# Patient Record
Sex: Female | Born: 1950 | Race: Black or African American | Hispanic: No | Marital: Married | Smoking: Never smoker
Health system: Southern US, Community
[De-identification: ages and names within clinical notes are randomized; demographics above are authoritative.]

## PROBLEM LIST (undated history)

## (undated) DIAGNOSIS — E119 Type 2 diabetes mellitus without complications: Secondary | ICD-10-CM

## (undated) DIAGNOSIS — M1990 Unspecified osteoarthritis, unspecified site: Secondary | ICD-10-CM

## (undated) DIAGNOSIS — C801 Malignant (primary) neoplasm, unspecified: Secondary | ICD-10-CM

## (undated) DIAGNOSIS — I10 Essential (primary) hypertension: Secondary | ICD-10-CM

## (undated) HISTORY — PX: OTHER SURGICAL HISTORY: SHX169

## (undated) HISTORY — PX: BREAST SURGERY: SHX581

## (undated) HISTORY — PX: TONSILLECTOMY: SUR1361

## (undated) HISTORY — PX: COLON SURGERY: SHX602

---

## 1979-05-05 HISTORY — PX: OTHER SURGICAL HISTORY: SHX169

## 1984-05-04 HISTORY — PX: ECTOPIC PREGNANCY SURGERY: SHX613

## 2004-03-06 ENCOUNTER — Other Ambulatory Visit: Admission: RE | Admit: 2004-03-06 | Discharge: 2004-03-06 | Payer: Self-pay | Admitting: Family Medicine

## 2005-07-07 ENCOUNTER — Other Ambulatory Visit: Admission: RE | Admit: 2005-07-07 | Discharge: 2005-07-07 | Payer: Self-pay | Admitting: *Deleted

## 2007-04-26 ENCOUNTER — Other Ambulatory Visit: Admission: RE | Admit: 2007-04-26 | Discharge: 2007-04-26 | Payer: Self-pay | Admitting: Family Medicine

## 2009-05-04 DIAGNOSIS — C801 Malignant (primary) neoplasm, unspecified: Secondary | ICD-10-CM

## 2009-05-04 HISTORY — DX: Malignant (primary) neoplasm, unspecified: C80.1

## 2010-04-24 ENCOUNTER — Other Ambulatory Visit
Admission: RE | Admit: 2010-04-24 | Discharge: 2010-04-24 | Payer: Self-pay | Source: Home / Self Care | Admitting: Family Medicine

## 2010-06-19 DIAGNOSIS — Z85038 Personal history of other malignant neoplasm of large intestine: Secondary | ICD-10-CM | POA: Insufficient documentation

## 2012-10-06 ENCOUNTER — Other Ambulatory Visit (HOSPITAL_COMMUNITY)
Admission: RE | Admit: 2012-10-06 | Discharge: 2012-10-06 | Disposition: A | Discharge status: Home / Self Care | Payer: BC Managed Care – PPO | Source: Ambulatory Visit | Attending: Family Medicine | Admitting: Family Medicine

## 2012-10-06 ENCOUNTER — Other Ambulatory Visit: Payer: Self-pay | Admitting: Family Medicine

## 2012-10-06 DIAGNOSIS — Z124 Encounter for screening for malignant neoplasm of cervix: Secondary | ICD-10-CM | POA: Insufficient documentation

## 2012-11-22 ENCOUNTER — Ambulatory Visit (HOSPITAL_COMMUNITY)
Admission: RE | Admit: 2012-11-22 | Discharge: 2012-11-22 | Disposition: A | Discharge status: Home / Self Care | Payer: No Typology Code available for payment source | Source: Ambulatory Visit | Attending: Orthopedic Surgery | Admitting: Orthopedic Surgery

## 2012-11-22 ENCOUNTER — Other Ambulatory Visit (HOSPITAL_COMMUNITY): Payer: Self-pay | Admitting: Orthopedic Surgery

## 2012-11-22 DIAGNOSIS — M79609 Pain in unspecified limb: Secondary | ICD-10-CM | POA: Insufficient documentation

## 2012-11-22 DIAGNOSIS — M79641 Pain in right hand: Secondary | ICD-10-CM

## 2013-05-04 HISTORY — PX: COLON SURGERY: SHX602

## 2013-07-20 ENCOUNTER — Encounter (HOSPITAL_COMMUNITY): Payer: Self-pay | Admitting: Pharmacy Technician

## 2013-07-24 ENCOUNTER — Ambulatory Visit (HOSPITAL_COMMUNITY)
Admission: RE | Admit: 2013-07-24 | Discharge: 2013-07-24 | Disposition: A | Discharge status: Home / Self Care | Payer: No Typology Code available for payment source | Source: Ambulatory Visit | Attending: Surgical | Admitting: Surgical

## 2013-07-24 ENCOUNTER — Encounter (HOSPITAL_COMMUNITY): Payer: Self-pay

## 2013-07-24 ENCOUNTER — Encounter (INDEPENDENT_AMBULATORY_CARE_PROVIDER_SITE_OTHER): Payer: Self-pay

## 2013-07-24 ENCOUNTER — Encounter (HOSPITAL_COMMUNITY)
Admission: RE | Admit: 2013-07-24 | Discharge: 2013-07-24 | Disposition: A | Discharge status: Home / Self Care | Payer: No Typology Code available for payment source | Source: Ambulatory Visit | Attending: Orthopedic Surgery | Admitting: Orthopedic Surgery

## 2013-07-24 DIAGNOSIS — I10 Essential (primary) hypertension: Secondary | ICD-10-CM | POA: Insufficient documentation

## 2013-07-24 DIAGNOSIS — R9431 Abnormal electrocardiogram [ECG] [EKG]: Secondary | ICD-10-CM | POA: Insufficient documentation

## 2013-07-24 DIAGNOSIS — Z01812 Encounter for preprocedural laboratory examination: Secondary | ICD-10-CM | POA: Insufficient documentation

## 2013-07-24 DIAGNOSIS — Z01810 Encounter for preprocedural cardiovascular examination: Secondary | ICD-10-CM | POA: Insufficient documentation

## 2013-07-24 DIAGNOSIS — Z01818 Encounter for other preprocedural examination: Secondary | ICD-10-CM | POA: Insufficient documentation

## 2013-07-24 DIAGNOSIS — E119 Type 2 diabetes mellitus without complications: Secondary | ICD-10-CM | POA: Insufficient documentation

## 2013-07-24 HISTORY — DX: Essential (primary) hypertension: I10

## 2013-07-24 HISTORY — DX: Malignant (primary) neoplasm, unspecified: C80.1

## 2013-07-24 HISTORY — DX: Type 2 diabetes mellitus without complications: E11.9

## 2013-07-24 HISTORY — DX: Unspecified osteoarthritis, unspecified site: M19.90

## 2013-07-24 LAB — URINALYSIS, ROUTINE W REFLEX MICROSCOPIC
Bilirubin Urine: NEGATIVE
Glucose, UA: NEGATIVE mg/dL
Hgb urine dipstick: NEGATIVE
Ketones, ur: NEGATIVE mg/dL
Leukocytes, UA: NEGATIVE
Nitrite: NEGATIVE
Protein, ur: NEGATIVE mg/dL
Specific Gravity, Urine: 1.024 (ref 1.005–1.030)
Urobilinogen, UA: 0.2 mg/dL (ref 0.0–1.0)
pH: 5.5 (ref 5.0–8.0)

## 2013-07-24 LAB — COMPREHENSIVE METABOLIC PANEL
ALT: 34 U/L (ref 0–35)
AST: 26 U/L (ref 0–37)
Albumin: 3.7 g/dL (ref 3.5–5.2)
Alkaline Phosphatase: 106 U/L (ref 39–117)
BUN: 20 mg/dL (ref 6–23)
CO2: 27 mEq/L (ref 19–32)
Calcium: 9.7 mg/dL (ref 8.4–10.5)
Chloride: 101 mEq/L (ref 96–112)
Creatinine, Ser: 1.03 mg/dL (ref 0.50–1.10)
GFR calc Af Amer: 66 mL/min — ABNORMAL LOW (ref >90)
GFR calc non Af Amer: 57 mL/min — ABNORMAL LOW (ref >90)
Glucose, Bld: 105 mg/dL — ABNORMAL HIGH (ref 70–99)
Potassium: 4.3 mEq/L (ref 3.7–5.3)
Sodium: 139 mEq/L (ref 137–147)
Total Bilirubin: 0.3 mg/dL (ref 0.3–1.2)
Total Protein: 7.8 g/dL (ref 6.0–8.3)

## 2013-07-24 LAB — SURGICAL PCR SCREEN
MRSA, PCR: NEGATIVE
STAPHYLOCOCCUS AUREUS: NEGATIVE

## 2013-07-24 LAB — CBC
HCT: 45.1 % (ref 36.0–46.0)
Hemoglobin: 15 g/dL (ref 12.0–15.0)
MCH: 32.2 pg (ref 26.0–34.0)
MCHC: 33.3 g/dL (ref 30.0–36.0)
MCV: 96.8 fL (ref 78.0–100.0)
PLATELETS: 237 10*3/uL (ref 150–400)
RBC: 4.66 MIL/uL (ref 3.87–5.11)
RDW: 14.3 % (ref 11.5–15.5)
WBC: 6.4 10*3/uL (ref 4.0–10.5)

## 2013-07-24 LAB — PROTIME-INR
INR: 0.9 (ref 0.00–1.49)
Prothrombin Time: 12 seconds (ref 11.6–15.2)

## 2013-07-24 LAB — APTT: aPTT: 30 seconds (ref 24–37)

## 2013-07-24 MED ORDER — CHLORHEXIDINE GLUCONATE 4 % EX LIQD: 60.0000 mL | Freq: Once | CUTANEOUS | Status: DC

## 2013-07-24 NOTE — Pre-Procedure Instructions (Signed)
EKG AND CXR WERE DONE TODAY - PREOP AT [facility]. 

## 2013-07-24 NOTE — Patient Instructions (Signed)
   YOUR SURGERY IS SCHEDULED AT [facility]  ON:  Wednesday  4/1  REPORT TO  SHORT STAY CENTER AT:  6:30 AM      PHONE # FOR SHORT STAY IS [phone]  DO NOT EAT OR DRINK ANYTHING AFTER MIDNIGHT THE NIGHT BEFORE YOUR SURGERY.  YOU MAY BRUSH YOUR TEETH, RINSE OUT YOUR MOUTH--BUT NO WATER, NO FOOD, NO CHEWING GUM, NO MINTS, NO CANDIES, NO CHEWING TOBACCO.  PLEASE TAKE THE FOLLOWING MEDICATIONS THE AM OF YOUR SURGERY WITH A FEW SIPS OF WATER:  NO MEDICATIONS TO TAKE AT HOME THE MORNING OF YOUR SURGERY.  IF YOU ARE DIABETIC:  DO NOT TAKE ANY DIABETIC MEDICATIONS THE AM OF YOUR SURGERY.    DO NOT BRING VALUABLES, MONEY, CREDIT CARDS.  DO NOT WEAR JEWELRY, MAKE-UP, NAIL POLISH AND NO METAL PINS OR CLIPS IN YOUR HAIR. CONTACT LENS, DENTURES / PARTIALS, GLASSES SHOULD NOT BE WORN TO SURGERY AND IN MOST CASES-HEARING AIDS WILL NEED TO BE REMOVED.  BRING YOUR GLASSES CASE, ANY EQUIPMENT NEEDED FOR YOUR CONTACT LENS. FOR PATIENTS ADMITTED TO THE HOSPITAL--CHECK OUT TIME THE DAY OF DISCHARGE IS 11:00 AM.  ALL INPATIENT ROOMS ARE PRIVATE - WITH BATHROOM, TELEPHONE, TELEVISION AND WIFI INTERNET.                                                    PLEASE READ OVER ANY  FACT SHEETS THAT YOU WERE GIVEN: MRSA INFORMATION, BLOOD TRANSFUSION INFORMATION, INCENTIVE SPIROMETER INFORMATION.  FAILURE TO FOLLOW THESE INSTRUCTIONS MAY RESULT IN THE CANCELLATION OF YOUR SURGERY. PLEASE BE AWARE THAT YOU MAY NEED ADDITIONAL BLOOD DRAWN DAY OF YOUR SURGERY  PATIENT SIGNATURE_________________________________

## 2013-08-01 NOTE — H&P (Signed)
TOTAL KNEE ADMISSION H&P  Patient is being admitted for right total knee arthroplasty.  Subjective:  Chief Complaint:right knee pain.  HPI: Darianna Amy, 63 y.o. female, has a history of pain and functional disability in the right knee due to arthritis and has failed non-surgical conservative treatments for greater than 12 weeks to includeNSAID's and/or analgesics, corticosteriod injections, viscosupplementation injections, use of assistive devices and activity modification.  Onset of symptoms was gradual, starting 2 years ago with gradually worsening course since that time. The patient noted no past surgery on the right knee(s).  Patient currently rates pain in the right knee(s) at 7 out of 10 with activity. Patient has night pain, worsening of pain with activity and weight bearing, pain that interferes with activities of daily living, pain with passive range of motion, crepitus and joint swelling.  Patient has evidence of periarticular osteophytes and joint space narrowing by imaging studies. There is no active infection.   Past Medical History  Diagnosis Date  . Hypertension   . Diabetes mellitus without complication   . Arthritis     OA RIGHT KNEE AND PAIN  . Cancer 2011    HX OF COLON CANCER; S/P SURGERY AND DID NOT HAVE TO HAVE CHEMO OR RADIATION    Past Surgical History  Procedure Laterality Date  . Colon surgery    . Tonsillectomy      AT AGE OF 12  . Breast surgery      RIGHT AND LEFT BREAST TUMORS REMOVED - BENIGN  . C-sections x 2    . Fatty tumor removed from right heel       Current outpatient prescriptions: amLODipine-benazepril (LOTREL) 5-20 MG per capsule, Take 1 capsule by mouth every morning., Disp: , Rfl: ;   metFORMIN (GLUMETZA) 500 MG (MOD) 24 hr tablet, Take 1,000 mg by mouth daily with breakfast., Disp: , Rfl: ;   oxyCODONE-acetaminophen (PERCOCET/ROXICET) 5-325 MG per tablet, Take 1 tablet by mouth every 4 (four) hours as needed for severe pain., Disp: , Rfl:    Allergies  Allergen Reactions  . Penicillins     Skin and nails peel     History  Substance Use Topics  . Smoking status: Never Smoker   . Smokeless tobacco: Never Used  . Alcohol Use: No    Family History Mother: MI, HTN  Review of Systems  Constitutional: Negative.   HENT: Negative.   Eyes: Negative.   Respiratory: Negative.   Cardiovascular: Negative.   Gastrointestinal: Negative.   Genitourinary: Positive for frequency. Negative for dysuria, urgency, hematuria and flank pain.  Musculoskeletal: Positive for joint pain. Negative for back pain, falls, myalgias and neck pain.       Right knee pain  Skin: Negative.   Neurological: Negative.   Endo/Heme/Allergies: Negative.   Psychiatric/Behavioral: Negative.     Objective:  Physical Exam  Constitutional: She is oriented to person, place, and time. She appears well-developed and well-nourished. No distress.  HENT:  Head: Normocephalic and atraumatic.  Left Ear: External ear normal.  Nose: Nose normal.  Mouth/Throat: Oropharynx is clear and moist.  Eyes: Conjunctivae and EOM are normal. Pupils are equal, round, and reactive to light.  Neck: Normal range of motion. Neck supple.  Cardiovascular: Normal rate, regular rhythm, normal heart sounds and intact distal pulses.   Respiratory: Effort normal and breath sounds normal. No respiratory distress. She has no wheezes.  GI: Soft. Bowel sounds are normal. She exhibits no distension. There is no tenderness.  Musculoskeletal:  Right hip: Normal.       Left hip: Normal.       Right knee: She exhibits decreased range of motion, swelling and abnormal alignment. She exhibits no effusion and no erythema. Tenderness found. Medial joint line tenderness noted. No lateral joint line tenderness noted.       Left knee: Normal.       Right lower leg: She exhibits no tenderness and no swelling.       Left lower leg: She exhibits no tenderness and no swelling.  Neurological: She  is alert and oriented to person, place, and time. She has normal strength and normal reflexes. No sensory deficit.  Skin: No rash noted. She is not diaphoretic. No erythema.  Psychiatric: She has a normal mood and affect. Her behavior is normal.   Vitals Weight: 240 lb Height: 62 in Body Surface Area: 2.18 m Body Mass Index: 43.9 kg/m Pulse: 72 (Regular) BP: 144/86 (Sitting, Left Arm, Standard)  Imaging Review Plain radiographs demonstrate severe degenerative joint disease of the right knee(s). The overall alignment issignificant varus. The bone quality appears to be good for age and reported activity level.  Assessment/Plan:  End stage arthritis, right knee   The patient history, physical examination, clinical judgment of the provider and imaging studies are consistent with end stage degenerative joint disease of the right knee(s) and total knee arthroplasty is deemed medically necessary. The treatment options including medical management, injection therapy arthroscopy and arthroplasty were discussed at length. The risks and benefits of total knee arthroplasty were presented and reviewed. The risks due to aseptic loosening, infection, stiffness, patella tracking problems, thromboembolic complications and other imponderables were discussed. The patient acknowledged the explanation, agreed to proceed with the plan and consent was signed. Patient is being admitted for inpatient treatment for surgery, pain control, PT, OT, prophylactic antibiotics, VTE prophylaxis, progressive ambulation and ADL's and discharge planning. The patient is planning to be discharged home with home health services     [place], [place]

## 2013-08-02 ENCOUNTER — Inpatient Hospital Stay (HOSPITAL_COMMUNITY)
Admission: RE | Admit: 2013-08-02 | Discharge: 2013-08-05 | DRG: 470 | Disposition: A | Discharge status: Skilled Nursing Facility | Payer: No Typology Code available for payment source | Source: Ambulatory Visit | Attending: Orthopedic Surgery | Admitting: Orthopedic Surgery

## 2013-08-02 ENCOUNTER — Inpatient Hospital Stay (HOSPITAL_COMMUNITY): Payer: No Typology Code available for payment source

## 2013-08-02 ENCOUNTER — Encounter (HOSPITAL_COMMUNITY): Payer: No Typology Code available for payment source | Admitting: Anesthesiology

## 2013-08-02 ENCOUNTER — Encounter (HOSPITAL_COMMUNITY)
Admission: RE | Disposition: A | Discharge status: Skilled Nursing Facility | Payer: Self-pay | Source: Ambulatory Visit | Attending: Orthopedic Surgery

## 2013-08-02 ENCOUNTER — Encounter (HOSPITAL_COMMUNITY): Payer: Self-pay | Admitting: *Deleted

## 2013-08-02 ENCOUNTER — Inpatient Hospital Stay (HOSPITAL_COMMUNITY): Payer: No Typology Code available for payment source | Admitting: Anesthesiology

## 2013-08-02 DIAGNOSIS — M1711 Unilateral primary osteoarthritis, right knee: Secondary | ICD-10-CM

## 2013-08-02 DIAGNOSIS — M179 Osteoarthritis of knee, unspecified: Secondary | ICD-10-CM | POA: Diagnosis present

## 2013-08-02 DIAGNOSIS — Z6841 Body Mass Index (BMI) 40.0 and over, adult: Secondary | ICD-10-CM

## 2013-08-02 DIAGNOSIS — Z8249 Family history of ischemic heart disease and other diseases of the circulatory system: Secondary | ICD-10-CM

## 2013-08-02 DIAGNOSIS — Z85038 Personal history of other malignant neoplasm of large intestine: Secondary | ICD-10-CM

## 2013-08-02 DIAGNOSIS — I10 Essential (primary) hypertension: Secondary | ICD-10-CM | POA: Diagnosis present

## 2013-08-02 DIAGNOSIS — Z96659 Presence of unspecified artificial knee joint: Secondary | ICD-10-CM

## 2013-08-02 DIAGNOSIS — M1710 Unilateral primary osteoarthritis, unspecified knee: Principal | ICD-10-CM | POA: Diagnosis present

## 2013-08-02 DIAGNOSIS — E119 Type 2 diabetes mellitus without complications: Secondary | ICD-10-CM | POA: Diagnosis present

## 2013-08-02 DIAGNOSIS — R110 Nausea: Secondary | ICD-10-CM | POA: Diagnosis not present

## 2013-08-02 DIAGNOSIS — M24569 Contracture, unspecified knee: Secondary | ICD-10-CM | POA: Diagnosis present

## 2013-08-02 HISTORY — PX: TOTAL KNEE ARTHROPLASTY: SHX125

## 2013-08-02 LAB — GLUCOSE, CAPILLARY
GLUCOSE-CAPILLARY: 123 mg/dL — AB (ref 70–99)
GLUCOSE-CAPILLARY: 121 mg/dL — AB (ref 70–99)
GLUCOSE-CAPILLARY: 133 mg/dL — AB (ref 70–99)
Glucose-Capillary: 147 mg/dL — ABNORMAL HIGH (ref 70–99)

## 2013-08-02 LAB — ABO/RH: ABO/RH(D): B NEG

## 2013-08-02 LAB — TYPE AND SCREEN
ABO/RH(D): B NEG
Antibody Screen: NEGATIVE

## 2013-08-02 SURGERY — ARTHROPLASTY, KNEE, TOTAL
Anesthesia: General | Site: Knee | Laterality: Right

## 2013-08-02 MED ORDER — SODIUM CHLORIDE 0.9 % IR SOLN
Status: DC | PRN
  Administered 2013-08-02: 1000 mL

## 2013-08-02 MED ORDER — PROMETHAZINE HCL 25 MG/ML IJ SOLN: 6.2500 mg | INTRAMUSCULAR | Status: DC | PRN

## 2013-08-02 MED ORDER — FERROUS SULFATE 325 (65 FE) MG PO TABS
325.0000 mg | ORAL_TABLET | Freq: Three times a day (TID) | ORAL | Status: DC
  Administered 2013-08-03 – 2013-08-05 (×8): 325 mg via ORAL
  Filled 2013-08-02 (×11): qty 1

## 2013-08-02 MED ORDER — BUPIVACAINE LIPOSOME 1.3 % IJ SUSP
20.0000 mL | Freq: Once | INTRAMUSCULAR | Status: AC
  Administered 2013-08-02: 20 mL
  Filled 2013-08-02: qty 20

## 2013-08-02 MED ORDER — METHOCARBAMOL 500 MG PO TABS
500.0000 mg | ORAL_TABLET | Freq: Four times a day (QID) | ORAL | Status: DC | PRN
  Administered 2013-08-03 – 2013-08-05 (×7): 500 mg via ORAL
  Filled 2013-08-02 (×7): qty 1

## 2013-08-02 MED ORDER — RIVAROXABAN 10 MG PO TABS
10.0000 mg | ORAL_TABLET | Freq: Every day | ORAL | Status: DC
  Administered 2013-08-03 – 2013-08-05 (×3): 10 mg via ORAL
  Filled 2013-08-02 (×4): qty 1

## 2013-08-02 MED ORDER — FERROUS SULFATE 325 (65 FE) MG PO TABS: 325.0000 mg | ORAL_TABLET | Freq: Three times a day (TID) | ORAL | Status: DC

## 2013-08-02 MED ORDER — SODIUM CHLORIDE 0.9 % IJ SOLN
INTRAMUSCULAR | Status: AC
  Filled 2013-08-02: qty 50

## 2013-08-02 MED ORDER — CLINDAMYCIN PHOSPHATE 900 MG/50ML IV SOLN
INTRAVENOUS | Status: AC
  Filled 2013-08-02: qty 50

## 2013-08-02 MED ORDER — METFORMIN HCL ER 500 MG PO TB24
1000.0000 mg | ORAL_TABLET | Freq: Every day | ORAL | Status: DC
  Administered 2013-08-03 – 2013-08-05 (×3): 1000 mg via ORAL
  Filled 2013-08-02 (×4): qty 2

## 2013-08-02 MED ORDER — FENTANYL CITRATE 0.05 MG/ML IJ SOLN
INTRAMUSCULAR | Status: AC
  Filled 2013-08-02: qty 5

## 2013-08-02 MED ORDER — CEFAZOLIN SODIUM-DEXTROSE 2-3 GM-% IV SOLR: 2.0000 g | INTRAVENOUS | Status: DC

## 2013-08-02 MED ORDER — NEOSTIGMINE METHYLSULFATE 1 MG/ML IJ SOLN
INTRAMUSCULAR | Status: DC | PRN
  Administered 2013-08-02: 3 mg via INTRAVENOUS

## 2013-08-02 MED ORDER — KETOROLAC TROMETHAMINE 30 MG/ML IJ SOLN: 15.0000 mg | Freq: Once | INTRAMUSCULAR | Status: DC | PRN

## 2013-08-02 MED ORDER — ALUM & MAG HYDROXIDE-SIMETH 200-200-20 MG/5ML PO SUSP: 30.0000 mL | ORAL | Status: DC | PRN

## 2013-08-02 MED ORDER — SODIUM CHLORIDE 0.9 % IJ SOLN
INTRAMUSCULAR | Status: AC
  Filled 2013-08-02: qty 10

## 2013-08-02 MED ORDER — ONDANSETRON HCL 4 MG/2ML IJ SOLN
4.0000 mg | Freq: Four times a day (QID) | INTRAMUSCULAR | Status: DC | PRN
  Administered 2013-08-02: 4 mg via INTRAVENOUS
  Filled 2013-08-02: qty 2

## 2013-08-02 MED ORDER — PROPOFOL 10 MG/ML IV BOLUS
INTRAVENOUS | Status: DC | PRN
  Administered 2013-08-02: 30 mg via INTRAVENOUS
  Administered 2013-08-02: 200 mg via INTRAVENOUS

## 2013-08-02 MED ORDER — DSS 100 MG PO CAPS: 100.0000 mg | ORAL_CAPSULE | Freq: Two times a day (BID) | ORAL | Status: DC

## 2013-08-02 MED ORDER — POLYETHYLENE GLYCOL 3350 17 G PO PACK
17.0000 g | PACK | Freq: Every day | ORAL | Status: DC | PRN
  Filled 2013-08-02: qty 1

## 2013-08-02 MED ORDER — METHOCARBAMOL 500 MG PO TABS: 500.0000 mg | ORAL_TABLET | Freq: Four times a day (QID) | ORAL | Status: DC | PRN

## 2013-08-02 MED ORDER — HYDROMORPHONE HCL PF 1 MG/ML IJ SOLN
1.0000 mg | INTRAMUSCULAR | Status: DC | PRN
  Administered 2013-08-02: 1 mg via INTRAVENOUS
  Administered 2013-08-02: 0.5 mg via INTRAVENOUS
  Filled 2013-08-02 (×2): qty 1

## 2013-08-02 MED ORDER — CLINDAMYCIN PHOSPHATE 900 MG/50ML IV SOLN
900.0000 mg | Freq: Once | INTRAVENOUS | Status: AC
  Administered 2013-08-02: 900 mg via INTRAVENOUS

## 2013-08-02 MED ORDER — ACETAMINOPHEN 10 MG/ML IV SOLN
1000.0000 mg | Freq: Once | INTRAVENOUS | Status: DC
  Filled 2013-08-02: qty 100

## 2013-08-02 MED ORDER — EPHEDRINE SULFATE 50 MG/ML IJ SOLN
INTRAMUSCULAR | Status: AC
  Filled 2013-08-02: qty 1

## 2013-08-02 MED ORDER — DOCUSATE SODIUM 100 MG PO CAPS
100.0000 mg | ORAL_CAPSULE | Freq: Two times a day (BID) | ORAL | Status: DC
  Administered 2013-08-03 – 2013-08-05 (×6): 100 mg via ORAL
  Filled 2013-08-02 (×8): qty 1

## 2013-08-02 MED ORDER — ACETAMINOPHEN 10 MG/ML IV SOLN
INTRAVENOUS | Status: DC | PRN
  Administered 2013-08-02: 1000 mg via INTRAVENOUS

## 2013-08-02 MED ORDER — FENTANYL CITRATE 0.05 MG/ML IJ SOLN
INTRAMUSCULAR | Status: AC
  Filled 2013-08-02: qty 2

## 2013-08-02 MED ORDER — ACETAMINOPHEN 325 MG PO TABS: 650.0000 mg | ORAL_TABLET | Freq: Four times a day (QID) | ORAL | Status: DC | PRN

## 2013-08-02 MED ORDER — OXYCODONE-ACETAMINOPHEN 5-325 MG PO TABS: 1.0000 | ORAL_TABLET | ORAL | Status: DC | PRN

## 2013-08-02 MED ORDER — AMLODIPINE BESY-BENAZEPRIL HCL 5-20 MG PO CAPS: 1.0000 | ORAL_CAPSULE | Freq: Every morning | ORAL | Status: DC

## 2013-08-02 MED ORDER — CISATRACURIUM BESYLATE (PF) 10 MG/5ML IV SOLN
INTRAVENOUS | Status: DC | PRN
  Administered 2013-08-02: 5 mg via INTRAVENOUS
  Administered 2013-08-02: 3 mg via INTRAVENOUS

## 2013-08-02 MED ORDER — METHOCARBAMOL 100 MG/ML IJ SOLN
500.0000 mg | Freq: Four times a day (QID) | INTRAMUSCULAR | Status: DC | PRN
  Administered 2013-08-02: 500 mg via INTRAVENOUS
  Filled 2013-08-02: qty 5

## 2013-08-02 MED ORDER — THROMBIN 5000 UNITS EX SOLR
OROMUCOSAL | Status: DC | PRN
  Administered 2013-08-02: 10:00:00 via TOPICAL

## 2013-08-02 MED ORDER — HYDROCODONE-ACETAMINOPHEN 5-325 MG PO TABS
1.0000 | ORAL_TABLET | ORAL | Status: DC | PRN
  Administered 2013-08-02 – 2013-08-03 (×2): 2 via ORAL
  Filled 2013-08-02 (×2): qty 2

## 2013-08-02 MED ORDER — BISACODYL 10 MG RE SUPP: 10.0000 mg | Freq: Every day | RECTAL | Status: DC | PRN

## 2013-08-02 MED ORDER — LACTATED RINGERS IV SOLN: INTRAVENOUS | Status: DC

## 2013-08-02 MED ORDER — HYDROMORPHONE HCL PF 1 MG/ML IJ SOLN
INTRAMUSCULAR | Status: AC
  Administered 2013-08-02: 0.5 mg via INTRAVENOUS
  Filled 2013-08-02: qty 1

## 2013-08-02 MED ORDER — ACETAMINOPHEN 650 MG RE SUPP: 650.0000 mg | Freq: Four times a day (QID) | RECTAL | Status: DC | PRN

## 2013-08-02 MED ORDER — LABETALOL HCL 5 MG/ML IV SOLN
INTRAVENOUS | Status: DC | PRN
  Administered 2013-08-02 (×2): 5 mg via INTRAVENOUS

## 2013-08-02 MED ORDER — POLYMYXIN B SULFATE 500000 UNITS IJ SOLR
INTRAMUSCULAR | Status: DC | PRN
  Administered 2013-08-02: 09:00:00

## 2013-08-02 MED ORDER — OXYCODONE-ACETAMINOPHEN 5-325 MG PO TABS
2.0000 | ORAL_TABLET | ORAL | Status: DC | PRN
  Administered 2013-08-03 – 2013-08-04 (×6): 2 via ORAL
  Filled 2013-08-02 (×7): qty 2

## 2013-08-02 MED ORDER — BENAZEPRIL HCL 20 MG PO TABS
20.0000 mg | ORAL_TABLET | Freq: Every day | ORAL | Status: DC
  Administered 2013-08-02 – 2013-08-05 (×4): 20 mg via ORAL
  Filled 2013-08-02 (×4): qty 1

## 2013-08-02 MED ORDER — NEOSTIGMINE METHYLSULFATE 1 MG/ML IJ SOLN
INTRAMUSCULAR | Status: AC
  Filled 2013-08-02: qty 10

## 2013-08-02 MED ORDER — HYDROMORPHONE HCL PF 2 MG/ML IJ SOLN
INTRAMUSCULAR | Status: AC
  Filled 2013-08-02: qty 1

## 2013-08-02 MED ORDER — KETAMINE HCL 10 MG/ML IJ SOLN
INTRAMUSCULAR | Status: DC | PRN
  Administered 2013-08-02: 30 mg via INTRAVENOUS

## 2013-08-02 MED ORDER — AMLODIPINE BESYLATE 5 MG PO TABS
5.0000 mg | ORAL_TABLET | Freq: Every day | ORAL | Status: DC
  Administered 2013-08-03 – 2013-08-05 (×3): 5 mg via ORAL
  Filled 2013-08-02 (×4): qty 1

## 2013-08-02 MED ORDER — HYDROMORPHONE HCL PF 1 MG/ML IJ SOLN
0.2500 mg | INTRAMUSCULAR | Status: DC | PRN
  Administered 2013-08-02 (×2): 0.5 mg via INTRAVENOUS

## 2013-08-02 MED ORDER — LACTATED RINGERS IV SOLN
INTRAVENOUS | Status: DC | PRN
  Administered 2013-08-02 (×2): via INTRAVENOUS

## 2013-08-02 MED ORDER — FENTANYL CITRATE 0.05 MG/ML IJ SOLN
INTRAMUSCULAR | Status: DC | PRN
  Administered 2013-08-02: 25 ug via INTRAVENOUS
  Administered 2013-08-02: 50 ug via INTRAVENOUS
  Administered 2013-08-02: 25 ug via INTRAVENOUS
  Administered 2013-08-02: 50 ug via INTRAVENOUS
  Administered 2013-08-02: 25 ug via INTRAVENOUS
  Administered 2013-08-02: 50 ug via INTRAVENOUS
  Administered 2013-08-02: 25 ug via INTRAVENOUS
  Administered 2013-08-02: 50 ug via INTRAVENOUS
  Administered 2013-08-02: 100 ug via INTRAVENOUS

## 2013-08-02 MED ORDER — INSULIN ASPART 100 UNIT/ML ~~LOC~~ SOLN
0.0000 [IU] | Freq: Three times a day (TID) | SUBCUTANEOUS | Status: DC
  Administered 2013-08-03: 2 [IU] via SUBCUTANEOUS
  Administered 2013-08-03: 3 [IU] via SUBCUTANEOUS
  Administered 2013-08-04: 2 [IU] via SUBCUTANEOUS

## 2013-08-02 MED ORDER — ONDANSETRON HCL 4 MG/2ML IJ SOLN
INTRAMUSCULAR | Status: DC | PRN
  Administered 2013-08-02 (×2): 2 mg via INTRAVENOUS

## 2013-08-02 MED ORDER — CISATRACURIUM BESYLATE 20 MG/10ML IV SOLN
INTRAVENOUS | Status: AC
  Filled 2013-08-02: qty 10

## 2013-08-02 MED ORDER — LACTATED RINGERS IV SOLN
INTRAVENOUS | Status: DC
  Administered 2013-08-03: 05:00:00 via INTRAVENOUS

## 2013-08-02 MED ORDER — ONDANSETRON HCL 4 MG PO TABS: 4.0000 mg | ORAL_TABLET | Freq: Four times a day (QID) | ORAL | Status: DC | PRN

## 2013-08-02 MED ORDER — LIDOCAINE HCL (CARDIAC) 20 MG/ML IV SOLN
INTRAVENOUS | Status: AC
  Filled 2013-08-02: qty 10

## 2013-08-02 MED ORDER — DEXAMETHASONE SODIUM PHOSPHATE 10 MG/ML IJ SOLN
INTRAMUSCULAR | Status: AC
  Filled 2013-08-02: qty 1

## 2013-08-02 MED ORDER — PROMETHAZINE HCL 25 MG/ML IJ SOLN
12.5000 mg | Freq: Four times a day (QID) | INTRAMUSCULAR | Status: DC | PRN
  Administered 2013-08-02: 12.5 mg via INTRAVENOUS
  Filled 2013-08-02: qty 1

## 2013-08-02 MED ORDER — GLYCOPYRROLATE 0.2 MG/ML IJ SOLN
INTRAMUSCULAR | Status: AC
  Filled 2013-08-02: qty 2

## 2013-08-02 MED ORDER — MENTHOL 3 MG MT LOZG
1.0000 | LOZENGE | OROMUCOSAL | Status: DC | PRN
  Filled 2013-08-02 (×3): qty 9

## 2013-08-02 MED ORDER — RIVAROXABAN 10 MG PO TABS: 10.0000 mg | ORAL_TABLET | Freq: Every day | ORAL | Status: DC

## 2013-08-02 MED ORDER — FLEET ENEMA 7-19 GM/118ML RE ENEM: 1.0000 | ENEMA | Freq: Once | RECTAL | Status: AC | PRN

## 2013-08-02 MED ORDER — PROPOFOL 10 MG/ML IV BOLUS
INTRAVENOUS | Status: AC
  Filled 2013-08-02: qty 20

## 2013-08-02 MED ORDER — HYDROMORPHONE HCL PF 1 MG/ML IJ SOLN
INTRAMUSCULAR | Status: DC | PRN
  Administered 2013-08-02 (×2): 1 mg via INTRAVENOUS

## 2013-08-02 MED ORDER — KETAMINE HCL 10 MG/ML IJ SOLN
INTRAMUSCULAR | Status: AC
  Filled 2013-08-02: qty 1

## 2013-08-02 MED ORDER — MIDAZOLAM HCL 2 MG/2ML IJ SOLN
INTRAMUSCULAR | Status: AC
  Filled 2013-08-02: qty 2

## 2013-08-02 MED ORDER — GLYCOPYRROLATE 0.2 MG/ML IJ SOLN
INTRAMUSCULAR | Status: DC | PRN
  Administered 2013-08-02: 0.4 mg via INTRAVENOUS

## 2013-08-02 MED ORDER — SODIUM CHLORIDE 0.9 % IJ SOLN
INTRAMUSCULAR | Status: DC | PRN
  Administered 2013-08-02: 20 mL

## 2013-08-02 MED ORDER — MIDAZOLAM HCL 5 MG/5ML IJ SOLN
INTRAMUSCULAR | Status: DC | PRN
  Administered 2013-08-02: 1 mg via INTRAVENOUS

## 2013-08-02 MED ORDER — CLINDAMYCIN PHOSPHATE 600 MG/50ML IV SOLN
600.0000 mg | Freq: Four times a day (QID) | INTRAVENOUS | Status: AC
  Administered 2013-08-02 (×2): 600 mg via INTRAVENOUS
  Filled 2013-08-02 (×2): qty 50

## 2013-08-02 MED ORDER — THROMBIN 5000 UNITS EX SOLR
CUTANEOUS | Status: AC
  Filled 2013-08-02: qty 5000

## 2013-08-02 MED ORDER — AMLODIPINE BESYLATE 5 MG PO TABS
5.0000 mg | ORAL_TABLET | Freq: Once | ORAL | Status: AC
  Administered 2013-08-02: 5 mg via ORAL
  Filled 2013-08-02: qty 1

## 2013-08-02 MED ORDER — EPHEDRINE SULFATE 50 MG/ML IJ SOLN
INTRAMUSCULAR | Status: DC | PRN
  Administered 2013-08-02: 10 mg via INTRAVENOUS
  Administered 2013-08-02: 5 mg via INTRAVENOUS
  Administered 2013-08-02: 10 mg via INTRAVENOUS

## 2013-08-02 MED ORDER — PHENOL 1.4 % MT LIQD
1.0000 | OROMUCOSAL | Status: DC | PRN
  Filled 2013-08-02: qty 177

## 2013-08-02 MED ORDER — ONDANSETRON HCL 4 MG/2ML IJ SOLN
INTRAMUSCULAR | Status: AC
  Filled 2013-08-02: qty 2

## 2013-08-02 SURGICAL SUPPLY — 67 items
ADH SKN CLS APL DERMABOND .7 (GAUZE/BANDAGES/DRESSINGS) ×1
BAG SPEC THK2 15X12 ZIP CLS (MISCELLANEOUS)
BAG ZIPLOCK 12X15 (MISCELLANEOUS) ×1 IMPLANT
BANDAGE ELASTIC 4 VELCRO ST LF (GAUZE/BANDAGES/DRESSINGS) ×3 IMPLANT
BANDAGE ELASTIC 6 VELCRO ST LF (GAUZE/BANDAGES/DRESSINGS) ×3 IMPLANT
BANDAGE ESMARK 6X9 LF (GAUZE/BANDAGES/DRESSINGS) ×1 IMPLANT
BLADE SAG 18X100X1.27 (BLADE) ×3 IMPLANT
BLADE SAW SGTL 11.0X1.19X90.0M (BLADE) ×3 IMPLANT
BNDG CMPR 9X6 STRL LF SNTH (GAUZE/BANDAGES/DRESSINGS) ×1
BNDG ESMARK 6X9 LF (GAUZE/BANDAGES/DRESSINGS) ×3
BONE CEMENT GENTAMICIN (Cement) ×6 IMPLANT
CAPT RP KNEE ×2 IMPLANT
CEMENT BONE GENTAMICIN 40 (Cement) ×2 IMPLANT
CUFF TOURN SGL QUICK 34 (TOURNIQUET CUFF) ×3
CUFF TRNQT CYL 34X4X40X1 (TOURNIQUET CUFF) ×1 IMPLANT
DERMABOND ADVANCED (GAUZE/BANDAGES/DRESSINGS) ×2
DERMABOND ADVANCED .7 DNX12 (GAUZE/BANDAGES/DRESSINGS) IMPLANT
DRAPE EXTREMITY T 121X128X90 (DRAPE) ×3 IMPLANT
DRAPE INCISE IOBAN 66X45 STRL (DRAPES) ×3 IMPLANT
DRAPE LG THREE QUARTER DISP (DRAPES) ×3 IMPLANT
DRAPE POUCH INSTRU U-SHP 10X18 (DRAPES) ×3 IMPLANT
DRAPE U-SHAPE 47X51 STRL (DRAPES) ×3 IMPLANT
DRSG AQUACEL AG ADV 3.5X10 (GAUZE/BANDAGES/DRESSINGS) ×2 IMPLANT
DRSG PAD ABDOMINAL 8X10 ST (GAUZE/BANDAGES/DRESSINGS) IMPLANT
DRSG TEGADERM 4X4.75 (GAUZE/BANDAGES/DRESSINGS) ×2 IMPLANT
DURAPREP 26ML APPLICATOR (WOUND CARE) ×3 IMPLANT
ELECT REM PT RETURN 9FT ADLT (ELECTROSURGICAL) ×3
ELECTRODE REM PT RTRN 9FT ADLT (ELECTROSURGICAL) ×1 IMPLANT
EVACUATOR 1/8 PVC DRAIN (DRAIN) ×3 IMPLANT
FACESHIELD WRAPAROUND (MASK) ×15 IMPLANT
FACESHIELD WRAPAROUND OR TEAM (MASK) ×5 IMPLANT
GAUZE SPONGE 2X2 8PLY STRL LF (GAUZE/BANDAGES/DRESSINGS) IMPLANT
GLOVE BIOGEL PI IND STRL 8 (GLOVE) ×1 IMPLANT
GLOVE BIOGEL PI INDICATOR 8 (GLOVE) ×2
GLOVE ECLIPSE 8.0 STRL XLNG CF (GLOVE) ×6 IMPLANT
GLOVE SURG SS PI 6.5 STRL IVOR (GLOVE) ×6 IMPLANT
GOWN BRE IMP PREV XXLGXLNG (GOWN DISPOSABLE) ×2 IMPLANT
GOWN STRL REUS W/TWL LRG LVL3 (GOWN DISPOSABLE) ×3 IMPLANT
GOWN STRL REUS W/TWL XL LVL3 (GOWN DISPOSABLE) ×5 IMPLANT
HANDPIECE INTERPULSE COAX TIP (DISPOSABLE) ×3
IMMOBILIZER KNEE 20 (SOFTGOODS) ×3 IMPLANT
KIT BASIN OR (CUSTOM PROCEDURE TRAY) ×3 IMPLANT
MANIFOLD NEPTUNE II (INSTRUMENTS) ×3 IMPLANT
NEEDLE HYPO 22GX1.5 SAFETY (NEEDLE) ×3 IMPLANT
NS IRRIG 1000ML POUR BTL (IV SOLUTION) IMPLANT
PACK TOTAL JOINT (CUSTOM PROCEDURE TRAY) ×3 IMPLANT
PADDING CAST COTTON 6X4 STRL (CAST SUPPLIES) IMPLANT
POSITIONER SURGICAL ARM (MISCELLANEOUS) ×3 IMPLANT
SET HNDPC FAN SPRY TIP SCT (DISPOSABLE) ×1 IMPLANT
SPONGE GAUZE 2X2 STER 10/PKG (GAUZE/BANDAGES/DRESSINGS) ×2
SPONGE LAP 18X18 X RAY DECT (DISPOSABLE) IMPLANT
SPONGE SURGIFOAM ABS GEL 100 (HEMOSTASIS) ×3 IMPLANT
STAPLER VISISTAT 35W (STAPLE) IMPLANT
SUCTION FRAZIER 12FR DISP (SUCTIONS) ×3 IMPLANT
SUT BONE WAX W31G (SUTURE) ×3 IMPLANT
SUT MNCRL AB 4-0 PS2 18 (SUTURE) ×3 IMPLANT
SUT VIC AB 1 CT1 27 (SUTURE) ×9
SUT VIC AB 1 CT1 27XBRD ANTBC (SUTURE) ×2 IMPLANT
SUT VIC AB 2-0 CT1 27 (SUTURE) ×6
SUT VIC AB 2-0 CT1 TAPERPNT 27 (SUTURE) ×2 IMPLANT
SUT VLOC 180 0 24IN GS25 (SUTURE) ×3 IMPLANT
SYR 20CC LL (SYRINGE) ×3 IMPLANT
TOWEL OR 17X26 10 PK STRL BLUE (TOWEL DISPOSABLE) ×4 IMPLANT
TOWER CARTRIDGE SMART MIX (DISPOSABLE) ×3 IMPLANT
TRAY FOLEY CATH 14FRSI W/METER (CATHETERS) ×3 IMPLANT
WATER STERILE IRR 1500ML POUR (IV SOLUTION) ×4 IMPLANT
WRAP KNEE MAXI GEL POST OP (GAUZE/BANDAGES/DRESSINGS) ×3 IMPLANT

## 2013-08-02 NOTE — Progress Notes (Signed)
PT Cancellation Note  Patient Details Name: Christina [name] MRN: [id number] DOB: December 12, 1950   Cancelled Treatment:        PT deferred this date 2* pt nausea.  Will follow in am.  Dempsey Ahonen 08/02/2013, 3:56 PM

## 2013-08-02 NOTE — Anesthesia Preprocedure Evaluation (Signed)
Anesthesia Evaluation  Patient identified by MRN, date of birth, ID band Patient awake    Reviewed: Allergy & Precautions, H&P , NPO status , Patient's Chart, lab work & pertinent test results  Airway Mallampati: II TM Distance: >3 FB Neck ROM: Full    Dental no notable dental hx.    Pulmonary neg pulmonary ROS,  breath sounds clear to auscultation  Pulmonary exam normal       Cardiovascular hypertension, Pt. on medications Rhythm:Regular Rate:Normal     Neuro/Psych negative neurological ROS  negative psych ROS   GI/Hepatic negative GI ROS, Neg liver ROS,   Endo/Other  diabetes, Type 2Morbid obesity  Renal/GU negative Renal ROS  negative genitourinary   Musculoskeletal negative musculoskeletal ROS (+)   Abdominal   Peds negative pediatric ROS (+)  Hematology negative hematology ROS (+)   Anesthesia Other Findings   Reproductive/Obstetrics negative OB ROS                           Anesthesia Physical Anesthesia Plan  ASA: III  Anesthesia Plan: General   Post-op Pain Management:    Induction: Intravenous  Airway Management Planned: Oral ETT  Additional Equipment:   Intra-op Plan:   Post-operative Plan: Extubation in OR  Informed Consent:   Dental advisory given  Plan Discussed with: CRNA and Surgeon  Anesthesia Plan Comments:         Anesthesia Quick Evaluation

## 2013-08-02 NOTE — Op Note (Signed)
NAMEBERNITA, BECKSTROM                ACCOUNT NO.:  [id number]  MEDICAL RECORD NO.:  [id number]  LOCATION:  78                         FACILITY:  [facility]  PHYSICIAN:  Kipp Brood. Zalaya Astarita, M.D.DATE OF BIRTH:  [birth year]  DATE OF PROCEDURE:  08/02/2013 DATE OF DISCHARGE:                              OPERATIVE REPORT   SURGEON:  Kipp Brood. Gladstone Lighter, M.D.  ASSISTANT:  Ardeen Jourdain, [place]  PREOPERATIVE DIAGNOSES: 1. Severe flexion contracture, right knee. 2. Severe genu varus, right knee. 3. Severe degenerative arthritis with bone-on-bone, right knee.  POSTOPERATIVE DIAGNOSES: 1. Severe flexion contracture, right knee. 2. Severe genu varus, right knee. 3. Severe degenerative arthritis with bone-on-bone, right knee.  OPERATION:  Right total knee arthroplasty utilizing DePuy system; all 3 components were cemented.  Gentamicin was used in the cement.  I utilized size 3 right posterior cruciate sacrificing femoral component, tibial component tray was a size 2.5.  The insert was a size 3, 15 mm thickness, and the patella was a size 35 with 3 pegs.  PROCEDURE:  Under general anesthesia, routine orthopedic prepping and draping of the right lower extremity was carried out.  The appropriate time-out was first carried out.  I also marked the appropriate right leg in the holding area.  The leg was exsanguinated with an Esmarch, tourniquet was elevated to 325 mmHg.  At this time, knee was flexed and an anterior approach to the knee was carried out.  Two flaps were created.  I then carried out a median parapatellar incision and reflected the patella laterally.  With the knee flexed, I did medial and lateral meniscectomies and excised the anterior and posterior cruciate ligaments.  I then did a synovectomy.  Then attention was directed to the femur, a drill hole was made in the intercondylar notch of the femur.  Guide rod was inserted up into the canal.  I thoroughly irrigated out the canal.  I  removed 30 mm thickness off the distal femur at this time.  At this particular point, we completed our synovectomy. I then measured the femur to be a size 3.  We cut the femur for a size 3 with anterior, posterior, and chamfering cuts in usual fashion.  Next, attention was directed to the tibial tray.  The tibial tray was a size 2.5.  We made a drill hole in the tibial plateau, inserted the guidewire, thoroughly irrigated out the canal, and removed 8 mm thickness off the affected medial side to help relieve the contracture. Note, I did do a good medial release as well.  After this, we then utilized our lamina spreaders and removed the posterior spurs.  We inserted our spacer guide and reflected a 15-mm thickness tibial component.  After that was done, we then proceeded to do our keel cut out of the proximal tibial plateau.  I then did my notch cut out of the distal femur.  Following that, we inserted our trial components, had excellent fit, and then did our resurfacing procedure on the patella for a size 35 patella.  Three drill holes were made in the patella.  We removed all trial components, water picked out the knee, cemented all 3 components simultaneously.  After the cement was hard, we removed all loose pieces of cement, irrigated the knee out, and injected half of the mixture of 20 mL of Exparel and 20 mL of saline during the procedure, and then inserted some thrombin-soaked Gelfoam in the posterior compartment.  Following this, we then inserted our trials with a 15-mm thickness insert that fit very nicely, so we then removed the trial tibial component, made sure there were no other loose pieces of cement, and then inserted our permanent 15 mm thickness insert size 3 rotating platform.  I reduced the knee and had good stability.  I then tightened up the medial corner in the usual fashion, inserted a Hemovac drain, closed the knee in layers in usual fashion.  Sterile dressings  were applied.  Note, the patient was allergic to penicillin, so we gave her Cleocin 900 mg.          ______________________________ Kipp Brood. Gladstone Lighter, M.D.     RAG/MEDQ  D:  08/02/2013  T:  08/02/2013  Job:  [id number]

## 2013-08-02 NOTE — Discharge Instructions (Addendum)
Walk with your walker. Weight bearing as tolerated [place] will follow you at home for your therapy Do not change your dressing unless there is excess drainage Shower only, no tub bath. Call if any temperatures greater than 101 or any wound complications: [phone] during the day and ask for Dr. Charlestine Night nurse, Brunilda Payor.  Information on my medicine - XARELTO (Rivaroxaban)  This medication education was reviewed with me or my healthcare representative as part of my discharge preparation.  The pharmacist that spoke with me during my hospital stay was:  Clovis Riley, [facility]  Why was Xarelto prescribed for you? Xarelto was prescribed for you to reduce the risk of blood clots forming after orthopedic surgery. The medical term for these abnormal blood clots is venous thromboembolism (VTE).  What do you need to know about xarelto ? Take your Xarelto ONCE DAILY at the same time every day. You may take it either with or without food.  If you have difficulty swallowing the tablet whole, you may crush it and mix in applesauce just prior to taking your dose.  Take Xarelto exactly as prescribed by your doctor and DO NOT stop taking Xarelto without talking to the doctor who prescribed the medication.  Stopping without other VTE prevention medication to take the place of Xarelto may increase your risk of developing a clot.  After discharge, you should have regular check-up appointments with your healthcare provider that is prescribing your Xarelto.    What do you do if you miss a dose? If you miss a dose, take it as soon as you remember on the same day then continue your regularly scheduled once daily regimen the next day. Do not take two doses of Xarelto on the same day.   Important Safety Information A possible side effect of Xarelto is bleeding. You should call your healthcare provider right away if you experience any of the following:   Bleeding from an injury or  your nose that does not stop.   Unusual colored urine (red or dark brown) or unusual colored stools (red or black).   Unusual bruising for unknown reasons.   A serious fall or if you hit your head (even if there is no bleeding).  Some medicines may interact with Xarelto and might increase your risk of bleeding while on Xarelto. To help avoid this, consult your healthcare provider or pharmacist prior to using any new prescription or non-prescription medications, including herbals, vitamins, non-steroidal anti-inflammatory drugs (NSAIDs) and supplements.  This website has more information on Xarelto: [email or web address]

## 2013-08-02 NOTE — Interval H&P Note (Signed)
History and Physical Interval Note:  08/02/2013 8:20 AM  Christina [name]  has presented today for surgery, with the diagnosis of OA OF RIGHT KNEE  The various methods of treatment have been discussed with the patient and family. After consideration of risks, benefits and other options for treatment, the patient has consented to  Procedure(s): RIGHT TOTAL KNEE ARTHROPLASTY (Right) as a surgical intervention .  The patient's history has been reviewed, patient examined, no change in status, stable for surgery.  I have reviewed the patient's chart and labs.  Questions were answered to the patient's satisfaction.     Christina [name] A

## 2013-08-02 NOTE — Anesthesia Postprocedure Evaluation (Signed)
  Anesthesia Post-op Note  Patient: Christina [name]  Procedure(s) Performed: Procedure(s) (LRB): RIGHT TOTAL KNEE ARTHROPLASTY (Right)  Patient Location: PACU  Anesthesia Type: General  Level of Consciousness: awake and alert   Airway and Oxygen Therapy: Patient Spontanous Breathing  Post-op Pain: mild  Post-op Assessment: Post-op Vital signs reviewed, Patient's Cardiovascular Status Stable, Respiratory Function Stable, Patent Airway and No signs of Nausea or vomiting  Last Vitals:  Filed Vitals:   08/02/13 1200  BP: 154/67  Pulse: 60  Temp:   Resp: 7    Post-op Vital Signs: stable   Complications: No apparent anesthesia complications

## 2013-08-02 NOTE — Brief Op Note (Signed)
08/02/2013  10:30 AM  PATIENT:  Christina [name]  63 y.o. 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  SPECIMEN:  No Specimen  DISPOSITION OF SPECIMEN:  N/A  COUNTS:  YES  TOURNIQUET:  * Missing tourniquet times found for documented tourniquets in log:  902111 *  DICTATION: .Other Dictation: Dictation Number [id number]  PLAN OF CARE: Admit to inpatient   PATIENT DISPOSITION:  Stable in OR   Delay start of Pharmacological VTE agent (>24hrs) due to surgical blood loss or risk of bleeding: yes

## 2013-08-02 NOTE — Transfer of Care (Signed)
Immediate Anesthesia Transfer of Care Note  Patient: Christina [name]  Procedure(s) Performed: Procedure(s): RIGHT TOTAL KNEE ARTHROPLASTY (Right)  Patient Location: PACU  Anesthesia Type:General  Level of Consciousness: awake, sedated and patient cooperative  Airway & Oxygen Therapy: Patient Spontanous Breathing and Patient connected to face mask oxygen  Post-op Assessment: Report given to PACU RN and Post -op Vital signs reviewed and stable  Post vital signs: stable  Complications: No apparent anesthesia complications

## 2013-08-03 LAB — BASIC METABOLIC PANEL
BUN: 20 mg/dL (ref 6–23)
CALCIUM: 8.3 mg/dL — AB (ref 8.4–10.5)
CO2: 26 meq/L (ref 19–32)
Chloride: 101 mEq/L (ref 96–112)
Creatinine, Ser: 1.3 mg/dL — ABNORMAL HIGH (ref 0.50–1.10)
GFR calc Af Amer: 50 mL/min — ABNORMAL LOW (ref >90)
GFR calc non Af Amer: 43 mL/min — ABNORMAL LOW (ref >90)
GLUCOSE: 169 mg/dL — AB (ref 70–99)
POTASSIUM: 4 meq/L (ref 3.7–5.3)
Sodium: 139 mEq/L (ref 137–147)

## 2013-08-03 LAB — GLUCOSE, CAPILLARY
GLUCOSE-CAPILLARY: 109 mg/dL — AB (ref 70–99)
GLUCOSE-CAPILLARY: 133 mg/dL — AB (ref 70–99)
Glucose-Capillary: 141 mg/dL — ABNORMAL HIGH (ref 70–99)
Glucose-Capillary: 157 mg/dL — ABNORMAL HIGH (ref 70–99)

## 2013-08-03 LAB — CBC
HEMATOCRIT: 38.6 % (ref 36.0–46.0)
HEMOGLOBIN: 12.6 g/dL (ref 12.0–15.0)
MCH: 32.3 pg (ref 26.0–34.0)
MCHC: 32.6 g/dL (ref 30.0–36.0)
MCV: 99 fL (ref 78.0–100.0)
Platelets: 187 10*3/uL (ref 150–400)
RBC: 3.9 MIL/uL (ref 3.87–5.11)
RDW: 14.8 % (ref 11.5–15.5)
WBC: 10.3 10*3/uL (ref 4.0–10.5)

## 2013-08-03 NOTE — Progress Notes (Signed)
Physical Therapy Treatment Patient Details Name: Christina [name] MRN: [id number] DOB: [birth year] Today's Date: 2013/08/16    History of Present Illness s/p TKR    PT Comments    Pt continues motivated and cooperative but fatigues easily and with increased time required for all tasks.  Follow Up Recommendations  Home health PT     Equipment Recommendations  Rolling walker with 5" wheels    Recommendations for Other Services OT consult     Precautions / Restrictions Precautions Precautions: Knee;Fall Required Braces or Orthoses: Knee Immobilizer - Right Knee Immobilizer - Right: Discontinue once straight leg raise with < 10 degree lag Restrictions Weight Bearing Restrictions: No Other Position/Activity Restrictions: WBAT    Mobility  Bed Mobility Overal bed mobility: Needs Assistance Bed Mobility: Sit to Supine       Sit to supine: Mod assist   General bed mobility comments: Increased time and cues for sequence and use of L LE to self assist  Transfers Overall transfer level: Needs assistance Equipment used: Rolling walker (2 wheeled) Transfers: Sit to/from Stand Sit to Stand: Mod assist         General transfer comment: cues for LE management and use of UEs to self assist  Ambulation/Gait Ambulation/Gait assistance: Mod assist Ambulation Distance (Feet): 23 Feet Assistive device: Rolling walker (2 wheeled) Gait Pattern/deviations: Step-to pattern;Decreased step length - right;Decreased step length - left;Shuffle;Antalgic;Trunk flexed Gait velocity: decr   General Gait Details: cues for posture, sequence, stride length and position from [employer]            Wheelchair Mobility    Modified Rankin (Stroke Patients Only)       Balance                                    Cognition Arousal/Alertness: Awake/alert Behavior During Therapy: WFL for tasks assessed/performed Overall Cognitive Status: Within Functional Limits for tasks  assessed                      Exercises      General Comments        Pertinent Vitals/Pain     Home Living                      Prior Function            PT Goals (current goals can now be found in the care plan section) Acute Rehab PT Goals Patient Stated Goal: be able to get around better PT Goal Formulation: With patient Time For Goal Achievement: 08/10/13 Potential to Achieve Goals: Good Progress towards PT goals: Progressing toward goals    Frequency  7X/week    PT Plan Current plan remains appropriate    Co-evaluation             End of Session Equipment Utilized During Treatment: Gait belt;Right knee immobilizer Activity Tolerance: Patient tolerated treatment well;Patient limited by fatigue Patient left: in bed;with call bell/phone within reach     Time: 9833-8250 PT Time Calculation (min): 29 min  Charges:  $Gait Training: 8-22 mins $Therapeutic Activity: 8-22 mins                    G Codes:      Christina [name] 08-16-13, 3:46 PM

## 2013-08-03 NOTE — Progress Notes (Signed)
Physical Therapy Treatment Patient Details Name: Christina [name] MRN: [id number] DOB: 02-04-1951 Today's Date: 2013/08/23    History of Present Illness s/p TKR    PT Comments    Pt motivated and cooperative but with increased time required all tasks - fatigues easily.    Follow Up Recommendations  Home health PT     Equipment Recommendations  Rolling walker with 5" wheels    Recommendations for Other Services OT consult     Precautions / Restrictions Precautions Precautions: Knee;Fall Required Braces or Orthoses: Knee Immobilizer - Right Knee Immobilizer - Right: Discontinue once straight leg raise with < 10 degree lag Restrictions Weight Bearing Restrictions: No Other Position/Activity Restrictions: WBAT    Mobility  Bed Mobility               General bed mobility comments: pt in chair  Transfers Overall transfer level: Needs assistance Equipment used: Rolling walker (2 wheeled) Transfers: Sit to/from Stand Sit to Stand: +2 physical assistance;Mod assist         General transfer comment: cues for LE management and use of UEs to self assist  Ambulation/Gait Ambulation/Gait assistance: +2 physical assistance;Mod assist Ambulation Distance (Feet): 20 Feet Assistive device: Rolling walker (2 wheeled) Gait Pattern/deviations: Step-to pattern;Decreased step length - right;Decreased step length - left;Shuffle;Antalgic;Trunk flexed Gait velocity: decr   General Gait Details: cues for posture, sequence, stride length and position from [employer]            Wheelchair Mobility    Modified Rankin (Stroke Patients Only)       Balance                                    Cognition Arousal/Alertness: Awake/alert Behavior During Therapy: WFL for tasks assessed/performed Overall Cognitive Status: Within Functional Limits for tasks assessed                      Exercises      General Comments        Pertinent Vitals/Pain  5/10; premed,     Home Living Family/patient expects to be discharged to:: Private residence Living Arrangements: Spouse/significant other Available Help at Discharge: Family Type of Home: House     Home Layout: Two level;1/2 bath on main level        Prior Function            PT Goals (current goals can now be found in the care plan section) Acute Rehab PT Goals Patient Stated Goal: be able to get around better PT Goal Formulation: With patient Time For Goal Achievement: 08/10/13 Potential to Achieve Goals: Good Progress towards PT goals: Progressing toward goals    Frequency  7X/week    PT Plan Current plan remains appropriate    Co-evaluation             End of Session Equipment Utilized During Treatment: Gait belt;Right knee immobilizer Activity Tolerance: Patient tolerated treatment well;Patient limited by fatigue Patient left: in chair;with call bell/phone within reach     Time: 1320-1346 PT Time Calculation (min): 26 min  Charges:  $Gait Training: 23-37 mins                    G Codes:      Christina [name] Aug 23, 2013, 2:40 PM

## 2013-08-03 NOTE — Progress Notes (Signed)
CARE MANAGEMENT NOTE 08/03/2013  Patient:  [facility]   Account Number:  [id number]  Date Initiated:  08/03/2013  Documentation initiated by:  Cheray Pardi  Subjective/Objective Assessment:   pt had tkr on 04012015/pod1,     Action/Plan:   home with hhc=pt and dme   Anticipated DC Date:  08/06/2013   Anticipated DC Plan:  [place] referral  NA      DC Planning Services  CM consult      [facility] Choice  NA   Choice offered to / List presented to:  NA   DME arranged  [place] arranged  HH-2 PT      Status of service:  In process, will continue to follow Medicare Important Message given?   (If response is "NO", the following Medicare IM given date fields will be blank) Date Medicare IM given:   Date Additional Medicare IM given:    Discharge Disposition:    Per UR Regulation:  Reviewed for med. necessity/level of care/duration of stay  If discussed at [place] of Stay Meetings, dates discussed:    Comments:  [id number]/Ravleen Ries Eldridge Dace, [place], [place] [phone] Chart Reviewed for discharge and hospital needs. Discharge needs at time of review: will need rolling wlaker and home P.T. Review of patient progress due on [id number].

## 2013-08-03 NOTE — Progress Notes (Signed)
   Subjective: 1 Day Post-Op Procedure(s) (LRB): RIGHT TOTAL KNEE ARTHROPLASTY (Right) Patient reports pain as moderate.   Patient seen in rounds without Dr. Gladstone Lighter. Patient is well, and has had no acute complaints or problems other than discomfort in her right knee. She reports that she had issues with nausea yesterday but has done well since having phenergan. No issues overnight. No SOB or chest pain.  Plan is to go Home after hospital stay.  Objective: Vital signs in last 24 hours: Temp:  [96.7 F (35.9 C)-98.7 F (37.1 C)] 98.7 F (37.1 C) (04/02 0541) Pulse Rate:  [56-80] 66 (04/02 0541) Resp:  [7-19] 16 (04/02 0541) BP: (146-184)/(61-99) 163/63 mmHg (04/02 0541) SpO2:  [95 %-100 %] 98 % (04/02 0541) Weight:  [108.863 kg (240 lb)] 108.863 kg (240 lb) (04/01 1305)  Intake/Output from previous day:  Intake/Output Summary (Last 24 hours) at 08/03/13 0724 Last data filed at 08/03/13 0600  Gross per 24 hour  Intake   3470 ml  Output   2895 ml  Net    575 ml    Intake/Output this shift:    Labs:  Recent Labs  08/03/13 0400  HGB 12.6    Recent Labs  08/03/13 0400  WBC 10.3  RBC 3.90  HCT 38.6  PLT 187    Recent Labs  08/03/13 0400  NA 139  K 4.0  CL 101  CO2 26  BUN 20  CREATININE 1.30*  GLUCOSE 169*  CALCIUM 8.3*   No results found for this basename: LABPT, INR,  in the last 72 hours  EXAM General - Patient is Alert and Oriented Extremity - Neurologically intact Neurovascular intact Dorsiflexion/Plantar flexion intact Dressing - dressing C/D/I Motor Function - intact, moving foot and toes well on exam.  Hemovac pulled without difficulty.  Past Medical History  Diagnosis Date  . Hypertension   . Diabetes mellitus without complication   . Arthritis     OA RIGHT KNEE AND PAIN  . Cancer 2011    HX OF COLON CANCER; S/P SURGERY AND DID NOT HAVE TO HAVE CHEMO OR RADIATION    Assessment/Plan: 1 Day Post-Op Procedure(s) (LRB): RIGHT TOTAL  KNEE ARTHROPLASTY (Right) Active Problems:   Osteoarthritis of right knee   Total knee replacement status  Estimated body mass index is 43.89 kg/(m^2) as calculated from the following:   Height as of this encounter: 5\' 2"  (1.575 m).   Weight as of this encounter: 108.863 kg (240 lb). Continue foley until after PT today as she is currently unstable to walk to the restroom Discharge home with home health Saturday  DVT Prophylaxis - Xarelto Weight-Bearing as tolerated    She is doing well. Nausea resolved. DC foley once patient is more stable with ambulation. Plan for DC Saturday as she will not have help at home before then. Will continue to monitor labs.   Nola Botkins LAUREN 08/03/2013, 7:24 AM

## 2013-08-03 NOTE — Evaluation (Signed)
Physical Therapy Evaluation Patient Details Name: Christina [name] MRN: [id number] DOB: June 10, 1950 Today's Date: 08/03/2013   History of Present Illness  s/p TKR  Clinical Impression  Pt s/p R TKR presents with decreased R LE strength/ROM and post op pain limiting functional mobility.  Pt should progress to d/c home with family assist and HHPT follow up    Follow Up Recommendations Home health PT    Equipment Recommendations  Rolling walker with 5" wheels    Recommendations for Other Services OT consult     Precautions / Restrictions Precautions Precautions: Knee Required Braces or Orthoses: Knee Immobilizer - Right Knee Immobilizer - Right: Discontinue once straight leg raise with < 10 degree lag Restrictions Weight Bearing Restrictions: No Other Position/Activity Restrictions: WBAT      Mobility  Bed Mobility Overal bed mobility: +2 for physical assistance             General bed mobility comments: pt in chair  Transfers Overall transfer level: Needs assistance Equipment used: Rolling walker (2 wheeled) Transfers: Sit to/from Stand Sit to Stand: Total assist         General transfer comment: unable to reach full stand  Ambulation/Gait Ambulation/Gait assistance: +2 physical assistance;Mod assist Ambulation Distance (Feet): 20 Feet Assistive device: Rolling walker (2 wheeled) Gait Pattern/deviations: Step-to pattern;Decreased step length - right;Decreased step length - left;Shuffle;Antalgic;Trunk flexed Gait velocity: decr   General Gait Details: cues for posture, sequence, stride length and position from [employer]            Wheelchair Mobility    Modified Rankin (Stroke Patients Only)       Balance                                             Pertinent Vitals/Pain 5/10 premed, ice packs provided    Home Living Family/patient expects to be discharged to:: Private residence Living Arrangements: Spouse/significant  other Available Help at Discharge: Family Type of Home: House Home Access: Level entry     Home Layout: Two level;1/2 bath on main level Home Equipment: None      Prior Function Level of Independence: Independent               Hand Dominance   Dominant Hand: Right    Extremity/Trunk Assessment   Upper Extremity Assessment: Generalized weakness           Lower Extremity Assessment: RLE deficits/detail RLE Deficits / Details: 2/5 quads with AAROM at knee -10 - 35 with muscle guarding    Cervical / Trunk Assessment: Normal  Communication   Communication: No difficulties  Cognition Arousal/Alertness: Awake/alert Behavior During Therapy: WFL for tasks assessed/performed Overall Cognitive Status: Within Functional Limits for tasks assessed                      General Comments      Exercises Total Joint Exercises Ankle Circles/Pumps: AROM;Both;15 reps;Supine Quad Sets: AROM;Both;10 reps;Supine Heel Slides: AAROM;Right;15 reps;Supine Straight Leg Raises: AAROM;Right;10 reps;Supine      Assessment/Plan    PT Assessment Patient needs continued PT services  PT Diagnosis Difficulty walking   PT Problem List Decreased strength;Decreased range of motion;Decreased activity tolerance;Decreased mobility;Decreased knowledge of use of DME;Obesity;Pain  PT Treatment Interventions DME instruction;Gait training;Stair training;Functional mobility training;Therapeutic activities;Therapeutic exercise;Patient/family education   PT Goals (Current goals can be found in  the Care Plan section) Acute Rehab PT Goals Patient Stated Goal: be able to get aound better PT Goal Formulation: With patient Time For Goal Achievement: 08/10/13 Potential to Achieve Goals: Good    Frequency 7X/week   Barriers to discharge        Co-evaluation               End of Session Equipment Utilized During Treatment: Gait belt;Right knee immobilizer Activity Tolerance: Patient  tolerated treatment well Patient left: in chair;with call bell/phone within reach Nurse Communication: Mobility status         Time: 7619-5093 PT Time Calculation (min): 43 min   Charges:   PT Evaluation $Initial PT Evaluation Tier I: 1 Procedure PT Treatments $Gait Training: 8-22 mins $Therapeutic Exercise: 8-22 mins $Therapeutic Activity: 8-22 mins   PT G Codes:          Janiel Crisostomo 08/03/2013, 12:31 PM

## 2013-08-03 NOTE — Evaluation (Signed)
Occupational Therapy Evaluation Patient Details Name: Christina [name] MRN: [id number] DOB: 1950-07-14 Today's Date: 08/03/2013    History of Present Illness s/p TKR   Clinical Impression   Pt presents to OT with decreased I with ADL activity due to problems listed below. Pt will benefit from skilled OT to increase I with     Follow Up Recommendations  Home health OT;Supervision/Assistance - 24 hour    Equipment Recommendations  3 in 1 bedside comode;Tub/shower bench       Precautions / Restrictions Precautions Precautions: Knee Restrictions Weight Bearing Restrictions: No      Mobility Bed Mobility               General bed mobility comments: pt in chair  Transfers Overall transfer level: Needs assistance Equipment used: Rolling walker (2 wheeled) Transfers: Sit to/from Stand Sit to Stand: Total assist         General transfer comment: unable to reach full stand         ADL Overall ADL's : Needs assistance/impaired Eating/Feeding: Set up;Sitting   Grooming: Set up;Sitting   Upper Body Bathing: Set up;Sitting   Lower Body Bathing: Sit to/from stand;Maximal assistance   Upper Body Dressing : Set up;Sitting   Lower Body Dressing: Sit to/from stand;Maximal assistance                 General ADL Comments: unable to reach full stand this OT session due to pain         Extremity/Trunk Assessment Upper Extremity Assessment Upper Extremity Assessment: Generalized weakness           Communication Communication Communication: No difficulties   Cognition Arousal/Alertness: Awake/alert   Overall Cognitive Status: Within Functional Limits for tasks assessed                                Home Living Family/patient expects to be discharged to:: Private residence Living Arrangements: Spouse/significant other Available Help at Discharge: Family Type of Home: [place]: Two level;1/2 bath on main level      Bathroom Shower/Tub: Teacher, early years/pre: Standard                     OT Diagnosis: Generalized weakness;Acute pain   OT Problem List: Decreased strength;Pain;Decreased activity tolerance   OT Treatment/Interventions: Self-care/ADL training;DME and/or AE instruction;Patient/family education    OT Goals(Current goals can be found in the care plan section) Acute Rehab OT Goals Patient Stated Goal: be able to get aound better OT Goal Formulation: With patient Time For Goal Achievement: 08/17/13 Potential to Achieve Goals: Good ADL Goals Pt Will Perform Grooming: with supervision;standing Pt Will Perform Lower Body Bathing: sit to/from stand;with adaptive equipment Pt Will Perform Lower Body Dressing: sit to/from stand;with adaptive equipment;with supervision Pt Will Transfer to Toilet: with supervision;regular height toilet Pt Will Perform Toileting - Clothing Manipulation and hygiene: with supervision;sit to/from stand Pt Will Perform Tub/Shower Transfer: with min guard assist;tub bench;rolling walker  OT Frequency: Min 3X/week              End of Session Nurse Communication: Mobility status  Activity Tolerance: Patient limited by pain Patient left: in chair;with call bell/phone within reach   Time: 1025-1053 OT Time Calculation (min): 28 min Charges:  OT General Charges $OT Visit: 1 Procedure OT Evaluation $Initial OT Evaluation Tier I: 1 Procedure OT  Treatments $Self Care/Home Management : 23-37 mins G-Codes:    Betsy Pries 2013-08-06, 11:41 AM

## 2013-08-04 LAB — BASIC METABOLIC PANEL
BUN: 16 mg/dL (ref 6–23)
CHLORIDE: 103 meq/L (ref 96–112)
CO2: 26 meq/L (ref 19–32)
Calcium: 8.6 mg/dL (ref 8.4–10.5)
Creatinine, Ser: 1.18 mg/dL — ABNORMAL HIGH (ref 0.50–1.10)
GFR calc Af Amer: 56 mL/min — ABNORMAL LOW (ref >90)
GFR calc non Af Amer: 48 mL/min — ABNORMAL LOW (ref >90)
Glucose, Bld: 167 mg/dL — ABNORMAL HIGH (ref 70–99)
Potassium: 3.8 mEq/L (ref 3.7–5.3)
Sodium: 139 mEq/L (ref 137–147)

## 2013-08-04 LAB — GLUCOSE, CAPILLARY
GLUCOSE-CAPILLARY: 131 mg/dL — AB (ref 70–99)
GLUCOSE-CAPILLARY: 101 mg/dL — AB (ref 70–99)
GLUCOSE-CAPILLARY: 116 mg/dL — AB (ref 70–99)
Glucose-Capillary: 128 mg/dL — ABNORMAL HIGH (ref 70–99)

## 2013-08-04 LAB — CBC
HCT: 34.1 % — ABNORMAL LOW (ref 36.0–46.0)
Hemoglobin: 11.1 g/dL — ABNORMAL LOW (ref 12.0–15.0)
MCH: 31.9 pg (ref 26.0–34.0)
MCHC: 32.6 g/dL (ref 30.0–36.0)
MCV: 98 fL (ref 78.0–100.0)
PLATELETS: 162 10*3/uL (ref 150–400)
RBC: 3.48 MIL/uL — AB (ref 3.87–5.11)
RDW: 15 % (ref 11.5–15.5)
WBC: 9.5 10*3/uL (ref 4.0–10.5)

## 2013-08-04 MED ORDER — OXYCODONE HCL 5 MG PO TABS
5.0000 mg | ORAL_TABLET | ORAL | Status: DC | PRN
  Administered 2013-08-04 (×4): 15 mg via ORAL
  Administered 2013-08-05: 10 mg via ORAL
  Administered 2013-08-05: 15 mg via ORAL
  Administered 2013-08-05: 10 mg via ORAL
  Filled 2013-08-04: qty 2
  Filled 2013-08-04: qty 3
  Filled 2013-08-04: qty 2
  Filled 2013-08-04: qty 3
  Filled 2013-08-04: qty 1
  Filled 2013-08-04 (×2): qty 3
  Filled 2013-08-04: qty 2

## 2013-08-04 MED ORDER — OXYCODONE HCL 5 MG PO TABS: 5.0000 mg | ORAL_TABLET | ORAL | Status: DC | PRN

## 2013-08-04 NOTE — Progress Notes (Signed)
Discussed d/c plan with PTA.  Agree with change in recommendation from [facility] to SNF.  Currently recommend ST-SNF upon discharge.  Carmelia Bake, PT, DPT 08/04/2013 Pager: [phone]

## 2013-08-04 NOTE — Progress Notes (Signed)
Physical Therapy Treatment Patient Details Name: Annmarie Plemmons MRN: [id number] DOB: 01-Dec-1950 Today's Date: 08/04/2013    History of Present Illness s/p TKR    PT Comments    POD # 2 am session.  Pt OOB in recliner.  Assisted pt out of recliner required + 2 assist and amb distance decreased due to increased c/o weakness/fatigue/pain.  Pt is not progressing with her mobility as well as she had hope and will need ST Rehab at SNF prior to D/C to home.  Pt agrees. Will update LPT.  Follow Up Recommendations  SNF     Equipment Recommendations       Recommendations for Other Services       Precautions / Restrictions Precautions Precautions: Knee;Fall Precaution Comments: Instructed pt on KI use for amb Required Braces or Orthoses: Knee Immobilizer - Right Knee Immobilizer - Right: Discontinue once straight leg raise with < 10 degree lag Restrictions Weight Bearing Restrictions: No Other Position/Activity Restrictions: WBAT    Mobility  Bed Mobility           Sit to supine: Mod assist   General bed mobility comments: Pt OOB in recliner  Transfers Overall transfer level: Needs assistance Equipment used: Rolling walker (2 wheeled) Transfers: Sit to/from Stand Sit to Stand: +2 physical assistance;Max assist         General transfer comment: 25% VC's on proper tech and hand placement plus increased time as pt struggled to perform due to werakness/fatigue/pain  Ambulation/Gait Ambulation/Gait assistance: Mod assist;+2 physical assistance Ambulation Distance (Feet): 7 Feet Assistive device: Rolling walker (2 wheeled) Gait Pattern/deviations: Step-to pattern;Decreased stance time - right;Trunk flexed;Wide base of support Gait velocity: decr   General Gait Details: decreased amb distance due to increased c/o weakness/fatigue/pain.  Great difficulty advancing either LE and MAX c/o UE weakness/fatigue using RW.   Stairs            Wheelchair Mobility     Modified Rankin (Stroke Patients Only)       Balance                                    Cognition   Behavior During Therapy: WFL for tasks assessed/performed Overall Cognitive Status: Within Functional Limits for tasks assessed                      Exercises   Total Knee Replacement TE's 10 reps B LE ankle pumps 10 reps towel squeezes 10 reps knee presses Followed by ICE     General Comments        Pertinent Vitals/Pain C/o 8/10 pain ICE applied Pain meds requested    Home Living                      Prior Function            PT Goals (current goals can now be found in the care plan section) Progress towards PT goals: Progressing toward goals    Frequency  7X/week    PT Plan      Co-evaluation             End of Session Equipment Utilized During Treatment: Gait belt;Right knee immobilizer Activity Tolerance: Patient limited by fatigue;Patient limited by pain Patient left: in chair;with call bell/phone within reach     Time: 0925-0948 PT Time Calculation (min): 23 min  Charges:  $Gait Training:  8-22 mins $Therapeutic Exercise: 8-22 mins                    G Codes:      Rica Koyanagi  PTA [facility]  Acute  Rehab Pager      [phone]

## 2013-08-04 NOTE — Discharge Summary (Signed)
PATIENT ID:      Christina [name]  MRN:     [id number] DOB/AGE:    [birth year] / 63 y.o.     DISCHARGE SUMMARY  ADMISSION DATE:    08/02/2013 DISCHARGE DATE:    ADMISSION DIAGNOSIS: OSTEOARTHRITIS OF RIGHT KNEE Past Medical History  Diagnosis Date  . Hypertension   . Diabetes mellitus without complication   . Arthritis     OA RIGHT KNEE AND PAIN  . Cancer 2011    HX OF COLON CANCER; S/P SURGERY AND DID NOT HAVE TO HAVE CHEMO OR RADIATION    DISCHARGE DIAGNOSIS:   Active Problems:   Osteoarthritis of right knee   Total knee replacement status   PROCEDURE: Procedure(s): RIGHT TOTAL KNEE ARTHROPLASTY on 08/02/2013  CONSULTS:   none  HISTORY:  See H&P in chart.  HOSPITAL COURSE:  Christina [name] is a 63 y.o. admitted on 08/02/2013 with a chief complaint of right knee pain and dysfunction, and found to have a diagnosis of OSTEOARTHRITIS OF RIGHT KNEE.  They were brought to the operating room on 08/02/2013 and underwent Procedure(s): RIGHT TOTAL KNEE ARTHROPLASTY.    They were given perioperative antibiotics: Anti-infectives   Start     Dose/Rate Route Frequency Ordered Stop   08/02/13 1430  clindamycin (CLEOCIN) IVPB 600 mg     600 mg 100 mL/hr over 30 Minutes Intravenous Every 6 hours 08/02/13 1326 08/02/13 2143   08/02/13 0917  polymyxin B 500,000 Units, bacitracin 50,000 Units in sodium chloride irrigation 0.9 % 500 mL irrigation  Status:  Discontinued       As needed 08/02/13 0917 08/02/13 1106   08/02/13 0845  clindamycin (CLEOCIN) IVPB 900 mg     900 mg 100 mL/hr over 30 Minutes Intravenous  Once 08/02/13 0830 08/02/13 0845   08/02/13 0638  ceFAZolin (ANCEF) IVPB 2 g/50 mL premix  Status:  Discontinued     2 g 100 mL/hr over 30 Minutes Intravenous On call to O.R. 08/02/13 5400 08/02/13 1318    .  Patient underwent the above named procedure and tolerated it well. The following day they were hemodynamically stable and pain was controlled on oral analgesics. They were neurovascularly  intact to the operative extremity.PT and OT was ordered and worked with patient per protocol. The patient after working with therapy and discussions with case management was felt to require additional help than she presently had in her home environment so a skilled rehab facility was sought. This is being done in preparation for discharge over the weekend.They were medically and orthopaedically stable on day 08/04/13 for discharge over the next 24-48 hrs     DIAGNOSTIC STUDIES:  RECENT RADIOGRAPHIC STUDIES :  Dg Chest 2 View  07/24/2013   CLINICAL DATA:  Diabetes.  Hypertension .  EXAM: CHEST  2 VIEW  COMPARISON:  None.  FINDINGS: Mediastinum and hilar structures are normal. The lungs are clear. Heart size normal. No pleural effusion or pneumothorax.  IMPRESSION: No active cardiopulmonary disease.   Electronically Signed   By: Marcello Moores  Register   On: 07/24/2013 17:08   Dg Knee 1-2 Views Right  08/02/2013   CLINICAL DATA:  Status post right knee replacement  EXAM: RIGHT KNEE - 1-2 VIEW  COMPARISON:  None.  FINDINGS: A right knee prosthesis is now seen. No loosening is noted. No acute fracture is noted. A surgical drain is seen in place.   Electronically Signed   By: Inez Catalina M.D.   On: 08/02/2013 11:31  RECENT VITAL SIGNS:  Patient Vitals for the past 24 hrs:  BP Temp Temp src Pulse Resp SpO2  08/04/13 1002 157/80 mmHg - - - - -  08/04/13 0630 185/95 mmHg 98 F (36.7 C) Oral 88 18 96 %  08/04/13 0400 - - - - 16 98 %  08/04/13 0000 - - - - 16 98 %  08/03/13 2100 131/79 mmHg 98.8 F (37.1 C) Oral 93 18 97 %  08/03/13 2000 - - - - 16 98 %  08/03/13 1600 - - - - 16 97 %  08/03/13 1350 185/80 mmHg 98.2 F (36.8 C) Oral 85 19 96 %  .  RECENT EKG RESULTS:    Orders placed during the hospital encounter of 07/24/13  . EKG 12-LEAD  . EKG 12-LEAD    DISCHARGE INSTRUCTIONS:  Discharge Orders   Future Orders Complete By Expires   Call MD / Call 911  As directed    Comments:     If you  experience chest pain or shortness of breath, CALL 911 and be transported to the hospital emergency room.  If you develope a fever above 101 F, pus (white drainage) or increased drainage or redness at the wound, or calf pain, call your surgeon's office.   Constipation Prevention  As directed    Comments:     Drink plenty of fluids.  Prune juice may be helpful.  You may use a stool softener, such as Colace (over the counter) 100 mg twice a day.  Use MiraLax (over the counter) for constipation as needed.   Diet Carb Modified  As directed    Discharge instructions  As directed    Comments:     Walk with your walker. Weight bearing as tolerated [place] will follow you at home for your therapy Do not change your dressing unless there is excess drainage Shower only, no tub bath. Call if any temperatures greater than 101 or any wound complications: [phone] during the day and ask for Dr. Charlestine Night nurse, Brunilda Payor.   Do not put a pillow under the knee. Place it under the heel.  As directed    Driving restrictions  As directed    Comments:     No driving   Increase activity slowly as tolerated  As directed       DISCHARGE MEDICATIONS:     Medication List         amLODipine-benazepril 5-20 MG per capsule  Commonly known as:  LOTREL  Take 1 capsule by mouth every morning.     DSS 100 MG Caps  Take 100 mg by mouth 2 (two) times daily.     ferrous sulfate 325 (65 FE) MG tablet  Take 1 tablet (325 mg total) by mouth 3 (three) times daily after meals.     metFORMIN 500 MG (MOD) 24 hr tablet  Commonly known as:  GLUMETZA  Take 1,000 mg by mouth daily with breakfast.     methocarbamol 500 MG tablet  Commonly known as:  ROBAXIN  Take 1 tablet (500 mg total) by mouth every 6 (six) hours as needed for muscle spasms.     oxyCODONE-acetaminophen 5-325 MG per tablet  Commonly known as:  PERCOCET/ROXICET  Take 1-2 tablets by mouth every 4 (four) hours as needed for severe pain.      rivaroxaban 10 MG Tabs tablet  Commonly known as:  XARELTO  Take 1 tablet (10 mg total) by mouth daily with breakfast.  FOLLOW UP VISIT:       Follow-up Information   Follow up with GIOFFRE,RONALD A, MD. Schedule an appointment as soon as possible for a visit in 2 weeks.   Specialty:  Orthopedic Surgery   Contact information:   [address] [place] [address] [phone]      Continue PT/OT with WBAT  Dressing change daily Can shower on day 5 from surgery  DISCHARGE TO: Skilled  DISPOSITION: Good  DISCHARGE CONDITION:  Festus Barren for Dr. Latanya Maudlin 08/04/2013, 12:53 PM

## 2013-08-04 NOTE — Progress Notes (Signed)
CSW contacted by OT to see pt for possible SNF placement. Pt has concerns regarding her progress and would like to look into ST Rehab. CSW has contacted her insurance and will begin authorization process for SNF placement.  Werner Lean LCSW [phone]

## 2013-08-04 NOTE — Progress Notes (Signed)
Occupational Therapy Treatment Patient Details Name: Christina [name] MRN: [id number] DOB: 1950-06-24 Today's Date: 08/04/2013    History of present illness s/p TKR   OT comments  Pt needed max A to get up from elevated bed this am.  She fatiqued and propped on walker when walking around bed:  Called for chair to be brought closer.  Pt states her plan had been 64 year old granddaughter with her til 2 pm daily, but she didn't know she would need physical help after surgery. She would benefit from snf.    Follow Up Recommendations  SNF (if home, needs 24/7 with adults)    Equipment Recommendations  3 in 1 bedside comode (pt prefers wide model)    Recommendations for Other Services      Precautions / Restrictions Precautions Precautions: Knee;Fall Required Braces or Orthoses: Knee Immobilizer - Right Knee Immobilizer - Right: Discontinue once straight leg raise with < 10 degree lag Restrictions Other Position/Activity Restrictions: WBAT       Mobility Bed Mobility           Sit to supine: Mod assist   General bed mobility comments: hob raised, increased time, cues and used sheet to support RLE  Transfers       Sit to Stand: Max assist;From elevated surface (from bed)         General transfer comment: cues for UE/LE placement    Balance                                   ADL Overall ADL's : Needs assistance/impaired             Lower Body Bathing: Maximal assistance;Sit to/from stand (max for sit to stand from bed;mod A bathe with reacher)           Toilet Transfer: Maximal assistance (for sit to stand from elevated bed; min A steps )           Functional mobility during ADLs:  (ambulates with min A but max for sit to stand) General ADL Comments: Pt had difficulty getting up from bed--cues and max A given for transition of sit to stand. Pt used sheet for bed mobility and able to get to eob with hob raised with mod A.  Demonstrated AE and  pt practiced with reacher and sock aid.  Educated on tub transfer bench.  pt has poor endurance and I recommend that she sponge bathes initially.  She has 12 year old granddaughter staying with her til 2 pm then husband.  May need stsnf unless she can arrange 24/7 with adults.  Pt fatiqued walking around bed to chair:  called for tech to bring it closer as she stopped and propped on walker twice.        Vision                     Perception     Praxis      Cognition   Behavior During Therapy: [facility] for tasks assessed/performed Overall Cognitive Status: Within Functional Limits for tasks assessed                       Extremity/Trunk Assessment               Exercises     Shoulder Instructions       General Comments      Pertinent  Vitals/ Pain       Pain R knee 5/10; ice applied and repositioned.  Initially lightheaded sitting eob  BP after several minutes (to get dynamap) was 138/82  Home Living                                          Prior Functioning/Environment              Frequency Min 3X/week     Progress Toward Goals  OT Goals(current goals can now be found in the care plan section)  Progress towards OT goals: Not progressing toward goals - comment (limited by fatique)     Plan   Needs to be changed   Co-evaluation                 End of Session     Activity Tolerance Patient limited by fatigue   Patient Left in chair;with call bell/phone within reach   Nurse Communication Mobility status        Time: 8185-6314 OT Time Calculation (min): 48 min  Charges: OT General Charges $OT Visit: 1 Procedure OT Treatments $Self Care/Home Management : 23-37 mins $Therapeutic Activity: 8-22 mins  Lamin Chandley 08/04/2013, 9:08 AM

## 2013-08-04 NOTE — Progress Notes (Signed)
Christina [name]  MRN: [id number] DOB/Age: [birth year] 64 y.o. Physician: Christina [name], M.D. 2 Days Post-Op Procedure(s) (LRB): RIGHT TOTAL KNEE ARTHROPLASTY (Right)  Subjective: Resting comfortably in bedside chair, good appetite, reports easy fatigability with therapy Vital Signs Temp:  [98 F (36.7 C)-98.8 F (37.1 C)] 98 F (36.7 C) (04/03 0630) Pulse Rate:  [85-93] 88 (04/03 0630) Resp:  [16-19] 18 (04/03 0630) BP: (131-185)/(79-95) 185/95 mmHg (04/03 0630) SpO2:  [96 %-98 %] 96 % (04/03 0630)  Lab Results  Recent Labs  08/03/13 0400 08/04/13 0425  WBC 10.3 9.5  HGB 12.6 11.1*  HCT 38.6 34.1*  PLT 187 162   BMET  Recent Labs  08/03/13 0400 08/04/13 0425  NA 139 139  K 4.0 3.8  CL 101 103  CO2 26 26  GLUCOSE 169* 167*  BUN 20 16  CREATININE 1.30* 1.18*  CALCIUM 8.3* 8.6   INR  Date Value Ref Range Status  07/24/2013 0.90  0.00 - 1.49 Final     Exam  Dressings dry, to be changed today by nursing, moves ankle well, poor quad tone  Plan Mobilize with PT, advance activity per TKA protocol. Home Saturday or Sunday  Christina [name] M 08/04/2013, 9:18 AM

## 2013-08-04 NOTE — Progress Notes (Signed)
Clinical Social Work Department BRIEF PSYCHOSOCIAL ASSESSMENT 08/04/2013  Patient:  Christina [name],Christina [name]     Account Number:  [id number]     Admit date:  08/02/2013  Clinical Social Worker:  HAIDINGER,JAMIE, LCSW  Date/Time:  08/04/2013 02:43 PM  Referred by:  CSW  Date Referred:  08/04/2013 Referred for  SNF Placement   Other Referral:   Interview type:  Patient Other interview type:    PSYCHOSOCIAL DATA Living Status:  HUSBAND Admitted from facility:   Level of care:   Primary support name:  Christina [name] Primary support relationship to patient:  SPOUSE Degree of support available:   supportive    CURRENT CONCERNS Current Concerns  Post-Acute Placement   Other Concerns:    SOCIAL WORK ASSESSMENT / PLAN Pt is a 62 yr old female living at home with spouse prior to hospitalization. CSW met with pt to assist with d/c planning. Pt was hoping to go home following hospital d/c but is progressing slowly with PT / OT. ST Rehab is needed . [employer] insurance has authorized SNF placement. SNF search has been initiated and pt has chosen [facility] for rehab. SNF has been contacted, and d/c summary sent. SNF is able to admit this weekend if pt is stable for d/c.   Assessment/plan status:  Psychosocial Support/Ongoing Assessment of Needs Other assessment/ plan:   Information/referral to community resources:   SNF list provided. Insurance coverage for SNF and ambulance transport reviewed.    PATIENT'S/FAMILY'S RESPONSE TO PLAN OF CARE: Pt is disappointed that she will not being going right from the hospital but feels rehab is the most appropriate d/c plan. " I had no idea I would need so much assistance following surgery. "  Pt is pleased her insurance will assist with the cost of placement. Insurance does not cover transport. Pt encouraged to work with PT and follow their recommendations regarding transport in order to ensure her safety.   Jamie Haidinger LCSW [phone]  

## 2013-08-04 NOTE — Progress Notes (Signed)
Clinical Social Work Department CLINICAL SOCIAL WORK PLACEMENT NOTE 08/04/2013  Patient:  Christina [name], Christina [name]  Account Number:  [id number] Admit date:  08/02/2013  Clinical Social Worker:  Werner Lean, LCSW  Date/time:  08/04/2013 03:05 PM  Clinical Social Work is seeking post-discharge placement for this patient at the following level of care:   SKILLED NURSING   (*CSW will update this form in Epic as items are completed)   08/04/2013  Patient/family provided with [facility] Department of Clinical Social Work's list of facilities offering this level of care within the geographic area requested by the patient (or if unable, by the patient's family).  08/04/2013  Patient/family informed of their freedom to choose among providers that offer the needed level of care, that participate in Medicare, Medicaid or managed care program needed by the patient, have an available bed and are willing to accept the patient.    Patient/family informed of [facility]' ownership interest in [facility], as well as of the fact that they are under no obligation to receive care at this facility.  PASARR submitted to EDS on 08/04/2013 PASARR number received from EDS on 08/04/2013  FL2 transmitted to all facilities in geographic area requested by pt/family on  08/04/2013 FL2 transmitted to all facilities within larger geographic area on   Patient informed that his/her managed care company has contracts with or will negotiate with  certain facilities, including the following:     Patient/family informed of bed offers received:  08/04/2013 Patient chooses bed at [facility] Physician recommends and patient chooses bed at    Patient to be transferred to [place] on   Patient to be transferred to facility by   The following physician request were entered in Epic:   Additional Comments:  Werner Lean LCSW [phone]

## 2013-08-05 LAB — CBC
HCT: 32.4 % — ABNORMAL LOW (ref 36.0–46.0)
Hemoglobin: 10.7 g/dL — ABNORMAL LOW (ref 12.0–15.0)
MCH: 32.4 pg (ref 26.0–34.0)
MCHC: 33 g/dL (ref 30.0–36.0)
MCV: 98.2 fL (ref 78.0–100.0)
PLATELETS: 150 10*3/uL (ref 150–400)
RBC: 3.3 MIL/uL — AB (ref 3.87–5.11)
RDW: 15 % (ref 11.5–15.5)
WBC: 8.4 10*3/uL (ref 4.0–10.5)

## 2013-08-05 LAB — GLUCOSE, CAPILLARY
GLUCOSE-CAPILLARY: 109 mg/dL — AB (ref 70–99)
Glucose-Capillary: 86 mg/dL (ref 70–99)

## 2013-08-05 NOTE — Progress Notes (Signed)
Physical Therapy Treatment Patient Details Name: Christina [name] MRN: [id number] DOB: May 21, 1950 Today's Date: 2013-08-19    History of Present Illness s/p TKR    PT Comments      Follow Up Recommendations  SNF     Equipment Recommendations  Rolling walker with 5" wheels    Recommendations for Other Services OT consult     Precautions / Restrictions Precautions Precautions: Knee;Fall Precaution Comments: Instructed pt on KI use for amb Required Braces or Orthoses: Knee Immobilizer - Right Knee Immobilizer - Right: Discontinue once straight leg raise with < 10 degree lag Restrictions Weight Bearing Restrictions: No Other Position/Activity Restrictions: WBAT    Mobility  Bed Mobility Overal bed mobility:  (OOB deferred until after additional pain meds)                Transfers                    Ambulation/Gait                 Stairs            Wheelchair Mobility    Modified Rankin (Stroke Patients Only)       Balance                                    Cognition Arousal/Alertness: Awake/alert Behavior During Therapy: WFL for tasks assessed/performed Overall Cognitive Status: Within Functional Limits for tasks assessed                      Exercises Total Joint Exercises Ankle Circles/Pumps: AROM;Both;15 reps;Supine Quad Sets: AROM;Both;10 reps;Supine Heel Slides: AAROM;Right;15 reps;Supine Straight Leg Raises: AAROM;Right;10 reps;Supine    General Comments        Pertinent Vitals/Pain 6/10; premed, ice packs provided    Home Living                      Prior Function            PT Goals (current goals can now be found in the care plan section) Acute Rehab PT Goals Patient Stated Goal: be able to get around better PT Goal Formulation: With patient Time For Goal Achievement: 08/10/13 Potential to Achieve Goals: Good Progress towards PT goals: Progressing toward goals     Frequency  7X/week    PT Plan Current plan remains appropriate    Co-evaluation             End of Session   Activity Tolerance: Patient tolerated treatment well;Patient limited by pain;Patient limited by fatigue Patient left: in bed;with call bell/phone within reach     Time: 0852-0910 PT Time Calculation (min): 18 min  Charges:  $Therapeutic Exercise: 8-22 mins                    G Codes:      Christina [name] 2013-08-19, 12:17 PM

## 2013-08-05 NOTE — Progress Notes (Signed)
Report given to Production assistant, radio at [facility].

## 2013-08-05 NOTE — Progress Notes (Signed)
Per MD, Pt ready for d/c.  Notified RN, Pt, family and facility.  Confirmed receipt of d/c summary, as admission arranged by Weekday CSW.  Facility ready to receive Pt.  Pt's husband to provide transportation and will be here at 1500 to pick Pt up.  Facility aware.  Bernita Raisin, [place] Work [phone]

## 2013-08-05 NOTE — Progress Notes (Signed)
Physical Therapy Treatment Patient Details Name: Christina [name] MRN: [id number] DOB: 1950/06/02 Today's Date: 08/05/2013    History of Present Illness s/p TKR    PT Comments    Marked improvement in activity tolerance from last day.    Follow Up Recommendations  SNF     Equipment Recommendations  Rolling walker with 5" wheels    Recommendations for Other Services OT consult     Precautions / Restrictions Precautions Precautions: Knee;Fall Precaution Comments: Instructed pt on KI use for amb Required Braces or Orthoses: Knee Immobilizer - Right Knee Immobilizer - Right: Discontinue once straight leg raise with < 10 degree lag Restrictions Weight Bearing Restrictions: No Other Position/Activity Restrictions: WBAT    Mobility  Bed Mobility Overal bed mobility: Needs Assistance;+2 for physical assistance Bed Mobility: Sit to Supine     Supine to sit: Mod assist;+2 for physical assistance     General bed mobility comments: Cues for sequence and use of L LE to self assist.  Physical assist for R LE management and to bring trunk to upright  Transfers Overall transfer level: Needs assistance Equipment used: Rolling walker (2 wheeled) Transfers: Sit to/from Stand Sit to Stand: +2 physical assistance;Min assist;Mod assist         General transfer comment: cues for LE management and use of UEs to self assist  Ambulation/Gait Ambulation/Gait assistance: +2 safety/equipment;Min assist;Mod assist Ambulation Distance (Feet): 39 Feet Assistive device: Rolling walker (2 wheeled) Gait Pattern/deviations: Step-to pattern;Decreased step length - right;Decreased step length - left;Shuffle;Trunk flexed Gait velocity: decr   General Gait Details: cues for sequence, posture, position from RW.  Increased time with multiple short rests required 2* fatigue   Stairs            Wheelchair Mobility    Modified Rankin (Stroke Patients Only)       Balance                                     Cognition Arousal/Alertness: Awake/alert Behavior During Therapy: WFL for tasks assessed/performed Overall Cognitive Status: Within Functional Limits for tasks assessed                      Exercises Total Joint Exercises Ankle Circles/Pumps: AROM;Both;15 reps;Supine Quad Sets: AROM;Both;10 reps;Supine Heel Slides: AAROM;Right;15 reps;Supine Straight Leg Raises: AAROM;Right;10 reps;Supine    General Comments        Pertinent Vitals/Pain 4/10; premed, cold packs provided    Home Living                      Prior Function            PT Goals (current goals can now be found in the care plan section) Acute Rehab PT Goals Patient Stated Goal: be able to get around better PT Goal Formulation: With patient Time For Goal Achievement: 08/10/13 Potential to Achieve Goals: Good Progress towards PT goals: Progressing toward goals    Frequency  7X/week    PT Plan Current plan remains appropriate    Co-evaluation             End of Session Equipment Utilized During Treatment: Gait belt;Right knee immobilizer Activity Tolerance: Patient tolerated treatment well;Patient limited by fatigue Patient left: in chair;with call bell/phone within reach     Time: 1001-1027 PT Time Calculation (min): 26 min  Charges:  $Gait Training: 23-37 mins $Therapeutic  Exercise: 8-22 mins                    G Codes:      Christina [name] 08/11/13, 12:22 PM

## 2013-08-05 NOTE — Progress Notes (Signed)
Received order to discharge pt to [facility] today via phone from Benjiman Core, PA-C.

## 2013-08-05 NOTE — Progress Notes (Signed)
Assessment unchanged. Pt discharged via wc to front entrance to meet awaiting vehicle to carry pt to [facility] as ordered and planned. Accompanied by NT and husband. Packet of copied information for facility as prepared by Education officer, museum given to husband to present upon arrival to [employer].

## 2013-08-05 NOTE — Progress Notes (Signed)
Subjective: Doing well.  Pain controlled.  Awaiting transfer to [facility].     Objective: Vital signs in last 24 hours: Temp:  [98.1 F (36.7 C)-99.3 F (37.4 C)] 98.8 F (37.1 C) (04/04 0626) Pulse Rate:  [85-87] 85 (04/04 0626) Resp:  [16-18] 18 (04/04 0800) BP: (132-173)/(72-81) 145/81 mmHg (04/04 0626) SpO2:  [94 %-99 %] 94 % (04/04 0800)  Intake/Output from previous day: 04/03 0701 - 04/04 0700 In: 720 [P.O.:720] Out: 1250 [Urine:1250] Intake/Output this shift:     Recent Labs  08/03/13 0400 08/04/13 0425 08/05/13 0534  HGB 12.6 11.1* 10.7*    Recent Labs  08/04/13 0425 08/05/13 0534  WBC 9.5 8.4  RBC 3.48* 3.30*  HCT 34.1* 32.4*  PLT 162 150    Recent Labs  08/03/13 0400 08/04/13 0425  NA 139 139  K 4.0 3.8  CL 101 103  CO2 26 26  BUN 20 16  CREATININE 1.30* 1.18*  GLUCOSE 169* 167*  CALCIUM 8.3* 8.6   No results found for this basename: LABPT, INR,  in the last 72 hours  Exam:  Aquacel dressing intact.  Calf nt, nvi.    Assessment/Plan: Transfer to [facility] when bed available.  F/u in office as scheduled.     Rosetta Rupnow M 08/05/2013, 10:10 AM

## 2013-08-07 ENCOUNTER — Encounter: Payer: Self-pay | Admitting: Internal Medicine

## 2013-08-07 ENCOUNTER — Non-Acute Institutional Stay (SKILLED_NURSING_FACILITY): Payer: No Typology Code available for payment source | Admitting: Internal Medicine

## 2013-08-07 DIAGNOSIS — I10 Essential (primary) hypertension: Secondary | ICD-10-CM | POA: Insufficient documentation

## 2013-08-07 DIAGNOSIS — Z96659 Presence of unspecified artificial knee joint: Secondary | ICD-10-CM

## 2013-08-07 DIAGNOSIS — E119 Type 2 diabetes mellitus without complications: Secondary | ICD-10-CM

## 2013-08-07 NOTE — Assessment & Plan Note (Signed)
Continue metformin home dose

## 2013-08-07 NOTE — Progress Notes (Signed)
MRN: [id number] Name: Christina [name]  Sex: female Age: 63 y.o. DOB: 1950/05/13  [facility] Facility/Room: 1206 Level Of Care: SNF Provider: Inocencio Homes D Emergency Contacts: Extended Emergency Contact Information Primary Emergency Contact: Ladouceur,Curtis Address: [address] [place], [place] [address] [place] Phone: [phone] Mobile Phone: [phone] Relation: Spouse Secondary Emergency Contact: Hancock,Teleasa Address: [address] [place], [place] [address] [place] Phone: [phone] Mobile Phone: [phone] Relation: Daughter  Code Status: FULL  Allergies: Penicillins  Chief Complaint  Patient presents with  . SNF admission    HPI: Patient is 63 y.o. female who is s/p R knee arthroplasty, admitted for OT/PT.  Past Medical History  Diagnosis Date  . Arthritis     OA RIGHT KNEE AND PAIN  . Cancer 2011    HX OF COLON CANCER; S/P SURGERY AND DID NOT HAVE TO HAVE CHEMO OR RADIATION  . Diabetes mellitus without complication   . Hypertension     Past Surgical History  Procedure Laterality Date  . Colon surgery    . Tonsillectomy      AT AGE OF 12  . Breast surgery      RIGHT AND LEFT BREAST TUMORS REMOVED - BENIGN  . C-sections x 2    . Fatty tumor removed from right heel    . Total knee arthroplasty Right 08/02/2013    Procedure: RIGHT TOTAL KNEE ARTHROPLASTY;  Surgeon: Tobi Bastos, MD;  Location: [facility];  Service: Orthopedics;  Laterality: Right;      Medication List       This list is accurate as of: 08/07/13 10:37 PM.  Always use your most recent med list.               amLODipine-benazepril 5-20 MG per capsule  Commonly known as:  LOTREL  Take 1 capsule by mouth every morning.     DSS 100 MG Caps  Take 100 mg by mouth 2 (two) times daily.     ferrous sulfate 325 (65 FE) MG tablet  Take 1 tablet (325 mg total) by mouth 3 (three) times daily after meals.     metFORMIN  500 MG (MOD) 24 hr tablet  Commonly known as:  GLUMETZA  Take 1,000 mg by mouth daily with breakfast.     methocarbamol 500 MG tablet  Commonly known as:  ROBAXIN  Take 1 tablet (500 mg total) by mouth every 6 (six) hours as needed for muscle spasms.     oxyCODONE-acetaminophen 5-325 MG per tablet  Commonly known as:  PERCOCET/ROXICET  Take 1-2 tablets by mouth every 4 (four) hours as needed for severe pain.     rivaroxaban 10 MG Tabs tablet  Commonly known as:  XARELTO  Take 1 tablet (10 mg total) by mouth daily with breakfast.        No orders of the defined types were placed in this encounter.     There is no immunization history on file for this patient.  History  Substance Use Topics  . Smoking status: Never Smoker   . Smokeless tobacco: Never Used  . Alcohol Use: No    Family history is noncontributory    Review of Systems  DATA OBTAINED: from patient, nurse; request pain med be scheduled GENERAL: Feels well no fevers, fatigue, appetite changes SKIN: No itching, rash or wounds EYES: No eye  pain, redness, discharge EARS: No earache, tinnitus, change in hearing NOSE: No congestion, drainage or bleeding  MOUTH/THROAT: No mouth or tooth pain, No sore throat, No difficulty chewing or swallowing  RESPIRATORY: No cough, wheezing, SOB CARDIAC: No chest pain, palpitations, + R lower extremity edema GI: No abdominal pain, No N/V/D or constipation, No heartburn or reflux  GU: No dysuria, frequency or urgency, or incontinence  MUSCULOSKELETAL: No unrelieved bone/joint pain NEUROLOGIC: No headache, dizziness or focal weakness PSYCHIATRIC: No overt anxiety or sadness. Sleeps well. No behavior issue.   Filed Vitals:   08/07/13 2233  BP: 129/64  Pulse: 87  Temp: 99 F (37.2 C)  Resp: 22    Physical Exam  GENERAL APPEARANCE: Alert, conversant. Appropriately groomed. No acute distress.  SKIN: No diaphoresis rash HEAD: Normocephalic, atraumatic  EYES:  Conjunctiva/lids clear. Pupils round, reactive. EOMs intact.  EARS: External exam WNL, canals clear. Hearing grossly normal.  NOSE: No deformity or discharge.  MOUTH/THROAT: Lips w/o lesions  RESPIRATORY: Breathing is even, unlabored. Lung sounds are clear   CARDIOVASCULAR: Heart RRR no murmurs, rubs or gallops. 1+ R  peripheral edema.  GASTROINTESTINAL: Abdomen is soft, non-tender, not distended w/ normal bowel sounds GENITOURINARY: Bladder non tender, not distended  MUSCULOSKELETAL: No abnormal joints or musculature NEUROLOGIC: Oriented X3. Cranial nerves 2-12 grossly intact. Moves all extremities no tremor. PSYCHIATRIC: Mood and affect appropriate to situation, no behavioral issues  Patient Active Problem List   Diagnosis Date Noted  . Diabetes mellitus without complication   . Hypertension   . Osteoarthritis of right knee 08/02/2013  . Total knee replacement status 08/02/2013    CBC    Component Value Date/Time   WBC 8.4 08/05/2013 0534   RBC 3.30* 08/05/2013 0534   HGB 10.7* 08/05/2013 0534   HCT 32.4* 08/05/2013 0534   PLT 150 08/05/2013 0534   MCV 98.2 08/05/2013 0534    CMP     Component Value Date/Time   NA 139 08/04/2013 0425   K 3.8 08/04/2013 0425   CL 103 08/04/2013 0425   CO2 26 08/04/2013 0425   GLUCOSE 167* 08/04/2013 0425   BUN 16 08/04/2013 0425   CREATININE 1.18* 08/04/2013 0425   CALCIUM 8.6 08/04/2013 0425   PROT 7.8 07/24/2013 1520   ALBUMIN 3.7 07/24/2013 1520   AST 26 07/24/2013 1520   ALT 34 07/24/2013 1520   ALKPHOS 106 07/24/2013 1520   BILITOT 0.3 07/24/2013 1520   GFRNONAA 48* 08/04/2013 0425   GFRAA 56* 08/04/2013 0425    Assessment and Plan  Total knee replacement status Pain meds-will schedule at pt request, robaxin and xarelto as prophylaxis;admitted OT/PT  Diabetes mellitus without complication Continue metformin home dose  Hypertension Continue Lotrel    Hennie Duos, MD

## 2013-08-07 NOTE — Assessment & Plan Note (Signed)
Pain meds-will schedule at pt request, robaxin and xarelto as prophylaxis;admitted OT/PT

## 2013-08-07 NOTE — Assessment & Plan Note (Signed)
Continue Lotrel. 

## 2013-08-07 NOTE — Progress Notes (Signed)
Clinical Social Work Department CLINICAL SOCIAL WORK PLACEMENT NOTE 08/07/2013  Patient:  Christina [name], Christina [name]  Account Number:  [id number] Admit date:  08/02/2013  Clinical Social Worker:  Werner Lean, LCSW  Date/time:  08/04/2013 03:05 PM  Clinical Social Work is seeking post-discharge placement for this patient at the following level of care:   SKILLED NURSING   (*CSW will update this form in Epic as items are completed)   08/04/2013  Patient/family provided with [facility] Department of Clinical Social Work's list of facilities offering this level of care within the geographic area requested by the patient (or if unable, by the patient's family).  08/04/2013  Patient/family informed of their freedom to choose among providers that offer the needed level of care, that participate in Medicare, Medicaid or managed care program needed by the patient, have an available bed and are willing to accept the patient.    Patient/family informed of [facility]' ownership interest in [facility], as well as of the fact that they are under no obligation to receive care at this facility.  PASARR submitted to EDS on 08/04/2013 PASARR number received from EDS on 08/04/2013  FL2 transmitted to all facilities in geographic area requested by pt/family on  08/04/2013 FL2 transmitted to all facilities within larger geographic area on   Patient informed that his/her managed care company has contracts with or will negotiate with  certain facilities, including the following:     Patient/family informed of bed offers received:  08/04/2013 Patient chooses bed at [facility] Physician recommends and patient chooses bed at    Patient to be transferred to [facility] on  08/05/2013 Patient to be transferred to facility by FAMILY  The following physician request were entered in Epic:   Additional Comments:  Werner Lean LCSW [phone]

## 2013-08-08 ENCOUNTER — Encounter: Payer: Self-pay | Admitting: *Deleted

## 2013-08-08 ENCOUNTER — Encounter: Payer: Self-pay | Admitting: Adult Health

## 2013-08-08 ENCOUNTER — Non-Acute Institutional Stay (SKILLED_NURSING_FACILITY): Payer: No Typology Code available for payment source | Admitting: Adult Health

## 2013-08-08 DIAGNOSIS — T8140XA Infection following a procedure, unspecified, initial encounter: Secondary | ICD-10-CM

## 2013-08-08 DIAGNOSIS — T8149XA Infection following a procedure, other surgical site, initial encounter: Secondary | ICD-10-CM

## 2013-08-08 NOTE — Progress Notes (Signed)
Patient ID: Christina [name], female   DOB: 06-30-1950, 64 y.o.   MRN: [id number]         PROGRESS NOTE  DATE: 08/08/2013  FACILITY:  [facility]  LEVEL OF CARE: SNF (31)  Acute Visit  CHIEF COMPLAINT:  Manage Right Knee Surgical Wound Infection  HISTORY OF PRESENT ILLNESS:This is a 63 year old female who has noted to have moderate amount of serosanguinous drainage on right knee surgical wound. She is a recent right total knee arthroplasty. Skin is warm to touch and erythematous.   PAST MEDICAL HISTORY : Reviewed.  No changes.  CURRENT MEDICATIONS: Reviewed per [facility]  REVIEW OF SYSTEMS:  GENERAL: no change in appetite, no fatigue, no weight changes, no chills or weakness RESPIRATORY: no cough, SOB, DOE, wheezing, hemoptysis CARDIAC: no chest pain, edema or palpitations GI: no abdominal pain, diarrhea, constipation, heart burn, nausea or vomiting  PHYSICAL EXAMINATION  GENERAL: no acute distress, normal body habitus SKIN: See HPI EYES: conjunctivae normal, sclerae normal, normal eye lids NECK: supple, trachea midline, no neck masses, no thyroid tenderness, no thyromegaly LYMPHATICS: no LAN in the neck, no supraclavicular LAN RESPIRATORY: breathing is even & unlabored, BS CTAB CARDIAC: RRR, no murmur,no extra heart sounds, no edema GI: abdomen soft, normal BS, no masses, no tenderness, no hepatomegaly, no splenomegaly PSYCHIATRIC: the patient is alert & oriented to person, affect & behavior appropriate  LABS/RADIOLOGY: Labs reviewed: Basic Metabolic Panel:  Recent Labs  07/24/13 1520 08/03/13 0400 08/04/13 0425  NA 139 139 139  K 4.3 4.0 3.8  CL 101 101 103  CO2 27 26 26   GLUCOSE 105* 169* 167*  BUN 20 20 16   CREATININE 1.03 1.30* 1.18*  CALCIUM 9.7 8.3* 8.6   Liver Function Tests:  Recent Labs  07/24/13 1520  AST 26  ALT 34  ALKPHOS 106  BILITOT 0.3  PROT 7.8  ALBUMIN 3.7   CBC:  Recent Labs  08/03/13 0400 08/04/13 0425  08/05/13 0534  WBC 10.3 9.5 8.4  HGB 12.6 11.1* 10.7*  HCT 38.6 34.1* 32.4*  MCV 99.0 98.0 98.2  PLT 187 162 150   CBG:  Recent Labs  08/04/13 2125 08/05/13 0725 08/05/13 1223  GLUCAP 128* 109* 86     ASSESSMENT/PLAN:  Surgical wound infection - start Doxycycline 100 mg 1 PO BID x 10 days; notify ortho surgeon    CPT CODE: 25366  Reiley Keisler Vargas - NP [facility] [phone]

## 2013-08-14 ENCOUNTER — Other Ambulatory Visit: Payer: Self-pay | Admitting: *Deleted

## 2013-08-14 MED ORDER — OXYCODONE-ACETAMINOPHEN 5-325 MG PO TABS: 1.0000 | ORAL_TABLET | ORAL | Status: DC | PRN

## 2013-08-14 NOTE — Telephone Encounter (Signed)
[facility] 

## 2013-08-31 ENCOUNTER — Non-Acute Institutional Stay (SKILLED_NURSING_FACILITY): Payer: No Typology Code available for payment source | Admitting: Adult Health

## 2013-08-31 DIAGNOSIS — E119 Type 2 diabetes mellitus without complications: Secondary | ICD-10-CM

## 2013-08-31 DIAGNOSIS — I10 Essential (primary) hypertension: Secondary | ICD-10-CM

## 2013-08-31 DIAGNOSIS — M1711 Unilateral primary osteoarthritis, right knee: Secondary | ICD-10-CM

## 2013-08-31 DIAGNOSIS — K5900 Constipation, unspecified: Secondary | ICD-10-CM

## 2013-08-31 DIAGNOSIS — T8149XA Infection following a procedure, other surgical site, initial encounter: Secondary | ICD-10-CM

## 2013-08-31 DIAGNOSIS — IMO0002 Reserved for concepts with insufficient information to code with codable children: Secondary | ICD-10-CM

## 2013-08-31 DIAGNOSIS — D62 Acute posthemorrhagic anemia: Secondary | ICD-10-CM

## 2013-08-31 DIAGNOSIS — Z96659 Presence of unspecified artificial knee joint: Secondary | ICD-10-CM

## 2013-08-31 DIAGNOSIS — T8140XA Infection following a procedure, unspecified, initial encounter: Secondary | ICD-10-CM

## 2013-08-31 DIAGNOSIS — M1710 Unilateral primary osteoarthritis, unspecified knee: Secondary | ICD-10-CM

## 2013-09-04 ENCOUNTER — Encounter: Payer: Self-pay | Admitting: Adult Health

## 2013-09-07 DIAGNOSIS — K5900 Constipation, unspecified: Secondary | ICD-10-CM | POA: Insufficient documentation

## 2013-09-07 NOTE — Addendum Note (Signed)
Addended by: Durenda Age C on: 09/07/2013 08:02 PM   Modules accepted: Level of Service

## 2013-09-07 NOTE — Progress Notes (Addendum)
Patient ID: Christina [name], female   DOB: 1950-07-29, 63 y.o.   MRN: [id number]         PROGRESS NOTE  DATE: 08/31/13  FACILITY:  [facility]  LEVEL OF CARE: SNF (31)  Acute Visit  CHIEF COMPLAINT:  Discharge Notes  HISTORY OF PRESENT ILLNESS:This is a 63 year old female who is for discharge home with Home health PT, OT and Nursing. DME: Rolling walker. 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 Patient was admitted to this facility for short-term rehabilitation after the patient's recent hospitalization.  Patient has completed SNF rehabilitation and therapy has cleared the patient for discharge.    Reassessment of ongoing problem(s):  HTN: Pt 's HTN remains stable.  Denies CP, sob, DOE, pedal edema, headaches, dizziness or visual disturbances.  No complications from the medications currently being used.  Last BP : 142/80  ANEMIA: The anemia has been stable. The patient denies fatigue, melena or hematochezia. No complications from the medications currently being used. 4/15 hgb 10.7  CONSTIPATION: The constipation remains stable. No complications from the medications presently being used. Patient denies ongoing constipation, abdominal pain, nausea or vomiting.  PAST MEDICAL HISTORY : Reviewed.  No changes.  CURRENT MEDICATIONS: Reviewed per [facility]  REVIEW OF SYSTEMS:  GENERAL: no change in appetite, no fatigue, no weight changes, no chills or weakness RESPIRATORY: no cough, SOB, DOE, wheezing, hemoptysis CARDIAC: no chest pain, edema or palpitations GI: no abdominal pain, diarrhea, constipation, heart burn, nausea or vomiting  PHYSICAL EXAMINATION  GENERAL: no acute distress, normal body habitus SKIN: right knee surgical incision is dry with slight redness EYES: conjunctivae normal, sclerae normal, normal eye lids NECK: supple, trachea midline, no neck masses, no thyroid  tenderness, no thyromegaly LYMPHATICS: no LAN in the neck, no supraclavicular LAN RESPIRATORY: breathing is even & unlabored, BS CTAB CARDIAC: RRR, no murmur,no extra heart sounds, RLE edema 2+ GI: abdomen soft, normal BS, no masses, no tenderness, no hepatomegaly, no splenomegaly EXTREMITIES: able to ambulate with walker PSYCHIATRIC: the patient is alert & oriented to person, affect & behavior appropriate  LABS/RADIOLOGY: Labs reviewed: Basic Metabolic Panel:  Recent Labs  07/24/13 1520 08/03/13 0400 08/04/13 0425  NA 139 139 139  K 4.3 4.0 3.8  CL 101 101 103  CO2 27 26 26   GLUCOSE 105* 169* 167*  BUN 20 20 16   CREATININE 1.03 1.30* 1.18*  CALCIUM 9.7 8.3* 8.6   Liver Function Tests:  Recent Labs  07/24/13 1520  AST 26  ALT 34  ALKPHOS 106  BILITOT 0.3  PROT 7.8  ALBUMIN 3.7   CBC:  Recent Labs  08/03/13 0400 08/04/13 0425 08/05/13 0534  WBC 10.3 9.5 8.4  HGB 12.6 11.1* 10.7*  HCT 38.6 34.1* 32.4*  MCV 99.0 98.0 98.2  PLT 187 162 150   CBG:  Recent Labs  08/04/13 2125 08/05/13 0725 08/05/13 1223  GLUCAP 128* 109* 86     ASSESSMENT/PLAN:  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  Patient will receive home health PT, OT and Nursing.  DME: Rolling walker Total  discharge time: Greater than 30 minutes Discharge time involved coordination of the discharge process with Education officer, museum, nursing staff and therapy department. Medical justification for home health services verified.   CPT CODE: 32023  Seth Bake - NP [facility] [phone]

## 2013-10-11 ENCOUNTER — Emergency Department (HOSPITAL_COMMUNITY)
Admission: EM | Admit: 2013-10-11 | Discharge: 2013-10-11 | Disposition: A | Discharge status: Home / Self Care | Payer: No Typology Code available for payment source | Attending: Emergency Medicine | Admitting: Emergency Medicine

## 2013-10-11 ENCOUNTER — Emergency Department (HOSPITAL_COMMUNITY): Payer: No Typology Code available for payment source

## 2013-10-11 ENCOUNTER — Encounter (HOSPITAL_COMMUNITY): Payer: Self-pay | Admitting: Emergency Medicine

## 2013-10-11 DIAGNOSIS — Y929 Unspecified place or not applicable: Secondary | ICD-10-CM | POA: Insufficient documentation

## 2013-10-11 DIAGNOSIS — Z792 Long term (current) use of antibiotics: Secondary | ICD-10-CM | POA: Insufficient documentation

## 2013-10-11 DIAGNOSIS — I10 Essential (primary) hypertension: Secondary | ICD-10-CM | POA: Insufficient documentation

## 2013-10-11 DIAGNOSIS — IMO0002 Reserved for concepts with insufficient information to code with codable children: Secondary | ICD-10-CM | POA: Insufficient documentation

## 2013-10-11 DIAGNOSIS — M25562 Pain in left knee: Secondary | ICD-10-CM

## 2013-10-11 DIAGNOSIS — Z85038 Personal history of other malignant neoplasm of large intestine: Secondary | ICD-10-CM | POA: Insufficient documentation

## 2013-10-11 DIAGNOSIS — M1710 Unilateral primary osteoarthritis, unspecified knee: Secondary | ICD-10-CM | POA: Insufficient documentation

## 2013-10-11 DIAGNOSIS — Z79899 Other long term (current) drug therapy: Secondary | ICD-10-CM | POA: Insufficient documentation

## 2013-10-11 DIAGNOSIS — S8990XA Unspecified injury of unspecified lower leg, initial encounter: Secondary | ICD-10-CM | POA: Insufficient documentation

## 2013-10-11 DIAGNOSIS — X500XXA Overexertion from strenuous movement or load, initial encounter: Secondary | ICD-10-CM | POA: Insufficient documentation

## 2013-10-11 DIAGNOSIS — S99929A Unspecified injury of unspecified foot, initial encounter: Principal | ICD-10-CM

## 2013-10-11 DIAGNOSIS — Z7982 Long term (current) use of aspirin: Secondary | ICD-10-CM | POA: Insufficient documentation

## 2013-10-11 DIAGNOSIS — S99919A Unspecified injury of unspecified ankle, initial encounter: Principal | ICD-10-CM

## 2013-10-11 DIAGNOSIS — Z880 Allergy status to penicillin: Secondary | ICD-10-CM | POA: Insufficient documentation

## 2013-10-11 DIAGNOSIS — Z96659 Presence of unspecified artificial knee joint: Secondary | ICD-10-CM | POA: Insufficient documentation

## 2013-10-11 DIAGNOSIS — E119 Type 2 diabetes mellitus without complications: Secondary | ICD-10-CM | POA: Insufficient documentation

## 2013-10-11 DIAGNOSIS — Y9301 Activity, walking, marching and hiking: Secondary | ICD-10-CM | POA: Insufficient documentation

## 2013-10-11 LAB — BASIC METABOLIC PANEL
BUN: 21 mg/dL (ref 6–23)
CALCIUM: 9.4 mg/dL (ref 8.4–10.5)
CO2: 25 mEq/L (ref 19–32)
Chloride: 101 mEq/L (ref 96–112)
Creatinine, Ser: 1.04 mg/dL (ref 0.50–1.10)
GFR, EST AFRICAN AMERICAN: 65 mL/min — AB (ref >90)
GFR, EST NON AFRICAN AMERICAN: 56 mL/min — AB (ref >90)
Glucose, Bld: 124 mg/dL — ABNORMAL HIGH (ref 70–99)
Potassium: 4.6 mEq/L (ref 3.7–5.3)
Sodium: 139 mEq/L (ref 137–147)

## 2013-10-11 LAB — CBC WITH DIFFERENTIAL/PLATELET
Basophils Absolute: 0 10*3/uL (ref 0.0–0.1)
Basophils Relative: 0 % (ref 0–1)
EOS ABS: 0.4 10*3/uL (ref 0.0–0.7)
EOS PCT: 7 % — AB (ref 0–5)
HCT: 39.5 % (ref 36.0–46.0)
Hemoglobin: 12.8 g/dL (ref 12.0–15.0)
Lymphocytes Relative: 30 % (ref 12–46)
Lymphs Abs: 1.7 10*3/uL (ref 0.7–4.0)
MCH: 31.3 pg (ref 26.0–34.0)
MCHC: 32.4 g/dL (ref 30.0–36.0)
MCV: 96.6 fL (ref 78.0–100.0)
Monocytes Absolute: 0.7 10*3/uL (ref 0.1–1.0)
Monocytes Relative: 13 % — ABNORMAL HIGH (ref 3–12)
Neutro Abs: 2.8 10*3/uL (ref 1.7–7.7)
Neutrophils Relative %: 50 % (ref 43–77)
PLATELETS: 281 10*3/uL (ref 150–400)
RBC: 4.09 MIL/uL (ref 3.87–5.11)
RDW: 15.2 % (ref 11.5–15.5)
WBC: 5.5 10*3/uL (ref 4.0–10.5)

## 2013-10-11 MED ORDER — LIDOCAINE HCL 1 % IJ SOLN
INTRAMUSCULAR | Status: AC
  Administered 2013-10-11: 10 mL via INTRADERMAL
  Filled 2013-10-11: qty 20

## 2013-10-11 MED ORDER — OXYCODONE-ACETAMINOPHEN 5-325 MG PO TABS
1.0000 | ORAL_TABLET | ORAL | Status: DC | PRN
  Administered 2013-10-11: 1 via ORAL
  Filled 2013-10-11: qty 1

## 2013-10-11 MED ORDER — TRIAMCINOLONE ACETONIDE 40 MG/ML IJ SUSP
40.0000 mg | Freq: Once | INTRAMUSCULAR | Status: AC
  Administered 2013-10-11: 40 mg via INTRA_ARTICULAR
  Filled 2013-10-11: qty 1

## 2013-10-11 MED ORDER — LIDOCAINE HCL 1 % IJ SOLN
1.0000 mL | Freq: Once | INTRAMUSCULAR | Status: AC
Start: 2013-10-11 — End: 2013-10-11
  Administered 2013-10-11: 10 mL via INTRADERMAL

## 2013-10-11 NOTE — Consult Note (Signed)
Reason for Consult:Left knee pain, s/p R TKA Referring Physician: EDP  HPI: Christina [name] is an 63 y.o. female s/p R TKA by Dr. Gladstone Lighter 08/02/13, developed post op wound dehiscence requiring local wound care with anticipated skin graft when granulation adequate. Reports increasing left knee pain dut to favoring R leg, presents this am to ED with c/o left leg "giving way".  Past Medical History  Diagnosis Date  . Arthritis     OA RIGHT KNEE AND PAIN  . Cancer 2011    HX OF COLON CANCER; S/P SURGERY AND DID NOT HAVE TO HAVE CHEMO OR RADIATION  . Diabetes mellitus without complication   . Hypertension     Past Surgical History  Procedure Laterality Date  . Colon surgery    . Tonsillectomy      AT AGE OF 12  . Breast surgery      RIGHT AND LEFT BREAST TUMORS REMOVED - BENIGN  . C-sections x 2    . Fatty tumor removed from right heel    . Total knee arthroplasty Right 08/02/2013    Procedure: RIGHT TOTAL KNEE ARTHROPLASTY;  Surgeon: Tobi Bastos, MD;  Location: [facility];  Service: Orthopedics;  Laterality: Right;    Family History  Problem Relation Age of Onset  . Hypertension Mother   . Heart attack Mother     Social History:  reports that she has never smoked. She has never used smokeless tobacco. She reports that she does not drink alcohol or use illicit drugs.  Allergies:  Allergies  Allergen Reactions  . Penicillins     Skin and nails peel     Medications: I have reviewed the patient's current medications.  Results for orders placed during the hospital encounter of 10/11/13 (from the past 48 hour(s))  CBC WITH DIFFERENTIAL     Status: Abnormal   Collection Time    10/11/13  5:43 AM      Result Value Ref Range   WBC 5.5  4.0 - 10.5 K/uL   RBC 4.09  3.87 - 5.11 MIL/uL   Hemoglobin 12.8  12.0 - 15.0 g/dL   HCT 39.5  36.0 - 46.0 %   MCV 96.6  78.0 - 100.0 fL   MCH 31.3  26.0 - 34.0 pg   MCHC 32.4  30.0 - 36.0 g/dL   RDW 15.2  11.5 - 15.5 %   Platelets 281  150 - 400  K/uL   Neutrophils Relative % 50  43 - 77 %   Neutro Abs 2.8  1.7 - 7.7 K/uL   Lymphocytes Relative 30  12 - 46 %   Lymphs Abs 1.7  0.7 - 4.0 K/uL   Monocytes Relative 13 (*) 3 - 12 %   Monocytes Absolute 0.7  0.1 - 1.0 K/uL   Eosinophils Relative 7 (*) 0 - 5 %   Eosinophils Absolute 0.4  0.0 - 0.7 K/uL   Basophils Relative 0  0 - 1 %   Basophils Absolute 0.0  0.0 - 0.1 K/uL  BASIC METABOLIC PANEL     Status: Abnormal   Collection Time    10/11/13  5:43 AM      Result Value Ref Range   Sodium 139  137 - 147 mEq/L   Potassium 4.6  3.7 - 5.3 mEq/L   Chloride 101  96 - 112 mEq/L   CO2 25  19 - 32 mEq/L   Glucose, Bld 124 (*) 70 - 99 mg/dL   BUN 21  6 - 23 mg/dL   Creatinine, Ser 1.04  0.50 - 1.10 mg/dL   Calcium 9.4  8.4 - 10.5 mg/dL   GFR calc non Af Amer 56 (*) >90 mL/min   GFR calc Af Amer 65 (*) >90 mL/min   Comment: (NOTE)     The eGFR has been calculated using the CKD EPI equation.     This calculation has not been validated in all clinical situations.     eGFR's persistently <90 mL/min signify possible Chronic Kidney     Disease.    Dg Knee Complete 4 Views Left  10/11/2013   CLINICAL DATA:  Generalized left knee pain after hyperextension injury yesterday.  EXAM: LEFT KNEE - COMPLETE 4+ VIEW  COMPARISON:  None.  FINDINGS: Mild medial compartment narrowing, likely early degenerative change. There is no evidence of fracture, dislocation, or joint effusion. There is no evidence of arthropathy or other focal bone abnormality. Soft tissues are unremarkable.  IMPRESSION: No acute bony abnormalities.   Electronically Signed   By: Lucienne Capers M.D.   On: 10/11/2013 05:12     Vitals Temp:  [99.1 F (37.3 C)] 99.1 F (37.3 C) (06/10 0429) Pulse Rate:  [74] 74 (06/10 0429) Resp:  [22] 22 (06/10 0429) BP: (192)/(79) 192/79 mmHg (06/10 0429) SpO2:  [98 %] 98 % (06/10 0429) There is no weight on file to calculate BMI.  Physical Exam: R knee with 4x6 cm open wound at distal  aspect of knee incision with granulation but moderate exudate and foul smell with small amount of necrotic tissue. Has been covered by xeroform creating occlusive dressing. No purulence or fluctuance. Left knee with mild effusion, diffusely tender along joint lines and popliteal fossa, no ligamentous instability, no increased temp, erythema or induration.     Assessment/Plan: Impression: Right knee wound dehiscence with wound showing moderate retained exudate and small amount of necrotic tissue Left knee strain, possible exacerbation of OA vs degenerative meniscal tear Treatment: left knee injection with Kenalog and lidocaine, right knee dressings converted to NS damp to dry, will contact home health to adjust dressing change protocol. F/u with Dr. Gladstone Lighter Thursday (tomorrow).  Christina [name] M 10/11/2013, 7:29 AM

## 2013-10-11 NOTE — ED Provider Notes (Signed)
Medical screening examination/treatment/procedure(s) were performed by non-physician practitioner and as supervising physician I was immediately available for consultation/collaboration.   EKG Interpretation None        Julianne Rice, MD 10/11/13 [phone]

## 2013-10-11 NOTE — ED Provider Notes (Signed)
6:24 AM BP 192/79  Pulse 74  Temp(Src) 99.1 F (37.3 C) (Oral)  Resp 22  SpO2 98% Patient received in care and off from [facility]. Patient with left knee injury unable to bear weight on the leg. She is status post right TKA with M.D. his since it is healing by secondary intention and unable to ambulate. Currently awaiting evaluation from Dr. supple. Disposition pending Dr. supple's evaluation. Medications - No data to display Results for orders placed during the hospital encounter of 10/11/13  CBC WITH DIFFERENTIAL      Result Value Ref Range   WBC 5.5  4.0 - 10.5 K/uL   RBC 4.09  3.87 - 5.11 MIL/uL   Hemoglobin 12.8  12.0 - 15.0 g/dL   HCT 39.5  36.0 - 46.0 %   MCV 96.6  78.0 - 100.0 fL   MCH 31.3  26.0 - 34.0 pg   MCHC 32.4  30.0 - 36.0 g/dL   RDW 15.2  11.5 - 15.5 %   Platelets 281  150 - 400 K/uL   Neutrophils Relative % 50  43 - 77 %   Neutro Abs 2.8  1.7 - 7.7 K/uL   Lymphocytes Relative 30  12 - 46 %   Lymphs Abs 1.7  0.7 - 4.0 K/uL   Monocytes Relative 13 (*) 3 - 12 %   Monocytes Absolute 0.7  0.1 - 1.0 K/uL   Eosinophils Relative 7 (*) 0 - 5 %   Eosinophils Absolute 0.4  0.0 - 0.7 K/uL   Basophils Relative 0  0 - 1 %   Basophils Absolute 0.0  0.0 - 0.1 K/uL  BASIC METABOLIC PANEL      Result Value Ref Range   Sodium 139  137 - 147 mEq/L   Potassium 4.6  3.7 - 5.3 mEq/L   Chloride 101  96 - 112 mEq/L   CO2 25  19 - 32 mEq/L   Glucose, Bld 124 (*) 70 - 99 mg/dL   BUN 21  6 - 23 mg/dL   Creatinine, Ser 1.04  0.50 - 1.10 mg/dL   Calcium 9.4  8.4 - 10.5 mg/dL   GFR calc non Af Amer 56 (*) >90 mL/min   GFR calc Af Amer 65 (*) >90 mL/min   DG KNEE COMPLETE 4 VIEWS LEFT   Final Result:       8:00 AM BP 170/66  Pulse 67  Temp(Src) 97.6 F (36.4 C) (Oral)  Resp 18  SpO2 100% Patient seen and eval by Dr. Onnie Graham. He did a left knee joint injection and dressing change. Patient is able to ambulate.  She will be discharged home with her husband. Knee immobilizer  placed. She will follow with Dr. Gladstone Lighter in the morning.   Margarita Mail, PA-C 10/11/13 [phone]

## 2013-10-11 NOTE — ED Notes (Signed)
Dr Supple at bedside

## 2013-10-11 NOTE — ED Notes (Signed)
MD at bedside.  MD SUPPLE AND MD GIOFFRE PRESENT TO EVALUATE PT. MD SUPPLE WILL ADMINISTER MEDICATION

## 2013-10-11 NOTE — ED Provider Notes (Signed)
CSN: [id number]     Arrival date & time 10/11/13  2376 History   First MD Initiated Contact with Patient 10/11/13 0436     Chief Complaint  Patient presents with  . Leg Pain    left    (Consider location/radiation/quality/duration/timing/severity/associated sxs/prior Treatment) HPI Comments: Patient is a 63 year old female who presents to the emergency department for left knee pain. Patient states that 1900 last night she was walking when she felt her left knee buckle followed by a "pop". Since this time patient has had severe, nonradiating pain in her L knee when weight bearing or with flexion/extension. Patient has taken 1 tablet 10/325 Percocet at 0230 for pain with mild relief. No pallor, back pain, numbness, or paresthesias.  Prior to L knee injury, patient was walking with rolling walker with weight primarily placed on her LLE. Patient with hx of R total knee arthoplasty on 08/02/13 done by Dr. Gladstone Lighter of [place]. Surgery was complicated by wound dehiscence and soft tissue skin infection. Wound healing by secondary intention and patient goes for frequent dressing changes and wound care for this reason. On doxycycline prophylactically; 100mg  PO BID. She denies fevers.   Patient is a 63 y.o. female presenting with leg pain. The history is provided by the patient. No language interpreter was used.  Leg Pain Associated symptoms: no back pain and no fever     Past Medical History  Diagnosis Date  . Arthritis     OA RIGHT KNEE AND PAIN  . Cancer 2011    HX OF COLON CANCER; S/P SURGERY AND DID NOT HAVE TO HAVE CHEMO OR RADIATION  . Diabetes mellitus without complication   . Hypertension    Past Surgical History  Procedure Laterality Date  . Colon surgery    . Tonsillectomy      AT AGE OF 12  . Breast surgery      RIGHT AND LEFT BREAST TUMORS REMOVED - BENIGN  . C-sections x 2    . Fatty tumor removed from right heel    . Total knee arthroplasty Right 08/02/2013    Procedure:  RIGHT TOTAL KNEE ARTHROPLASTY;  Surgeon: Tobi Bastos, MD;  Location: [facility];  Service: Orthopedics;  Laterality: Right;   Family History  Problem Relation Age of Onset  . Hypertension Mother   . Heart attack Mother    History  Substance Use Topics  . Smoking status: Never Smoker   . Smokeless tobacco: Never Used  . Alcohol Use: No   OB History   Grav Para Term Preterm Abortions TAB SAB Ect Mult Living                  Review of Systems  Constitutional: Negative for fever.  Musculoskeletal: Positive for arthralgias and joint swelling. Negative for back pain.  Neurological: Negative for numbness.  All other systems reviewed and are negative.    Allergies  Penicillins  Home Medications   Prior to Admission medications   Medication Sig Start Date End Date Taking? Authorizing Provider  amLODipine-benazepril (LOTREL) 5-20 MG per capsule Take 1 capsule by mouth every morning.   Yes Historical Provider, MD  aspirin 325 MG tablet Take 325 mg by mouth daily.   Yes Historical Provider, MD  doxycycline (VIBRA-TABS) 100 MG tablet Take 100 mg by mouth 2 (two) times daily.   Yes Historical Provider, MD  metFORMIN (GLUMETZA) 500 MG (MOD) 24 hr tablet Take 1,000 mg by mouth daily with breakfast.   Yes Historical  Provider, MD  methocarbamol (ROBAXIN) 500 MG tablet Take 1 tablet (500 mg total) by mouth every 6 (six) hours as needed for muscle spasms. 08/02/13  Yes Amber Renelda Loma, PA-C  oxyCODONE-acetaminophen (PERCOCET/ROXICET) 5-325 MG per tablet Take 1-2 tablets by mouth every 4 (four) hours as needed for severe pain. 08/14/13  Yes Tiffany L Reed, DO   BP 192/79  Pulse 74  Temp(Src) 99.1 F (37.3 C) (Oral)  Resp 22  SpO2 98%  Physical Exam  Nursing note and vitals reviewed. Constitutional: She is oriented to person, place, and time. She appears well-developed and well-nourished. No distress.  HENT:  Head: Normocephalic and atraumatic.  Eyes: Conjunctivae and EOM are  normal. No scleral icterus.  Neck: Normal range of motion.  Cardiovascular: Normal rate, regular rhythm and intact distal pulses.   Pulses:      Dorsalis pedis pulses are 2+ on the left side.       Posterior tibial pulses are 2+ on the left side.  Capillary refill normal in all digits of L foot.  Pulmonary/Chest: Effort normal. No respiratory distress.  Musculoskeletal:       Left knee: She exhibits decreased range of motion (decreased active ROM secondary to pain; full PROM of L knee) and swelling (mild). She exhibits no effusion, no ecchymosis, no deformity, no erythema, normal alignment, no LCL laxity, normal patellar mobility, no bony tenderness and no MCL laxity. Tenderness found. Lateral joint line tenderness noted.       Legs: TTP along lateral joint line and with valgus stressing. Mild swelling appreciated. No effusion, deformity, or crepitus.  Neurological: She is alert and oriented to person, place, and time. No sensory deficit. GCS eye subscore is 4. GCS verbal subscore is 5. GCS motor subscore is 6.  Reflex Scores:      Patellar reflexes are 2+ on the left side.      Achilles reflexes are 2+ on the left side. Skin: Skin is warm and dry. No rash noted. She is not diaphoretic. No erythema. No pallor.  Psychiatric: She has a normal mood and affect. Her behavior is normal.    ED Course  Procedures (including critical care time) Labs Review Labs Reviewed  CBC WITH DIFFERENTIAL - Abnormal; Notable for the following:    Monocytes Relative 13 (*)    Eosinophils Relative 7 (*)    All other components within normal limits  BASIC METABOLIC PANEL    Imaging Review Dg Knee Complete 4 Views Left  10/11/2013   CLINICAL DATA:  Generalized left knee pain after hyperextension injury yesterday.  EXAM: LEFT KNEE - COMPLETE 4+ VIEW  COMPARISON:  None.  FINDINGS: Mild medial compartment narrowing, likely early degenerative change. There is no evidence of fracture, dislocation, or joint  effusion. There is no evidence of arthropathy or other focal bone abnormality. Soft tissues are unremarkable.  IMPRESSION: No acute bony abnormalities.   Electronically Signed   By: Lucienne Capers M.D.   On: 10/11/2013 05:12     EKG Interpretation None      MDM   Final diagnoses:  Left knee pain    Patient presents for left knee pain after her knee gave out while ambulating. She states that she felt a buckle followed by hearing a "pop" in her knee. Patient neurovascularly intact in physical exam. No knee instability noted. No effusions. X-ray today negative for fracture, dislocation, or effusion.  Patient unable to bear weight secondary to pain. Patient already with limited weightbearing as she is  s/p R knee arthoplasty with complications including wound dehiscence and associated soft tissue skin infection. Injury today has now left patient unable to bear weight on either leg.  Have consulted with Dr. Onnie Graham of [facility] who has graciously agreed to come evaluate the patient. Management options discussed included inpatient admission vs social work consult for placement in SNF. Awaiting direction from Dr. Onnie Graham regarding patient management. Margarita Mail, PA-C to follow patient and disposition appropriately; sign out given at shift change.   Filed Vitals:   10/11/13 0429  BP: 192/79  Pulse: 74  Temp: 99.1 F (37.3 C)  TempSrc: Oral  Resp: 22  SpO2: 98%      Antonietta Breach, PA-C 10/11/13 [phone]

## 2013-10-11 NOTE — ED Notes (Signed)
Pt at X-ray

## 2013-10-11 NOTE — ED Notes (Signed)
TO SEE DR. GIOFFRE AS SCHEDULE IN OFFICE

## 2013-10-11 NOTE — Discharge Instructions (Signed)
Home health to continue with wound care, change to NS damp to dry bid. (Change to normal saline damp to dry wound dressing 2 x a day).  Joint Injection Care After Refer to this sheet in the next few days. These instructions provide you with information on caring for yourself after you have had a joint injection. Your caregiver also may give you more specific instructions. Your treatment has been planned according to current medical practices, but problems sometimes occur. Call your caregiver if you have any problems or questions after your procedure. After any type of joint injection, it is not uncommon to experience:  Soreness, swelling, or bruising around the injection site.  Mild numbness, tingling, or weakness around the injection site caused by the numbing medicine used before or with the injection. It also is possible to experience the following effects associated with the specific agent after injection:  Iodine-based contrast agents:  Allergic reaction (itching, hives, widespread redness, and swelling beyond the injection site).  Corticosteroids (These effects are rare.):  Allergic reaction.  Increased blood sugar levels (If you have diabetes and you notice that your blood sugar levels have increased, notify your caregiver).  Increased blood pressure levels.  Mood swings.  Hyaluronic acid in the use of viscosupplementation.  Temporary heat or redness.  Temporary rash and itching.  Increased fluid accumulation in the injected joint. These effects all should resolve within a day after your procedure.  HOME CARE INSTRUCTIONS  Limit yourself to light activity the day of your procedure. Avoid lifting heavy objects, bending, stooping, or twisting.  Take prescription or over-the-counter pain medication as directed by your caregiver.  You may apply ice to your injection site to reduce pain and swelling the day of your procedure. Ice may be applied 03-04 times:  Put ice in a  plastic bag.  Place a towel between your skin and the bag.  Leave the ice on for no longer than 15-20 minutes each time. SEEK IMMEDIATE MEDICAL CARE IF:   Pain and swelling get worse rather than better or extend beyond the injection site.  Numbness does not go away.  Blood or fluid continues to leak from the injection site.  You have chest pain.  You have swelling of your face or tongue.  You have trouble breathing or you become dizzy.  You develop a fever, chills, or severe tenderness at the injection site that last longer than 1 day. MAKE SURE YOU:  Understand these instructions.  Watch your condition.  Get help right away if you are not doing well or if you get worse. Document Released: 01/01/2011 Document Revised: 07/13/2011 Document Reviewed: 01/01/2011 [facility] Patient Information 2014 [place]. Fall Prevention and Home Safety Falls cause injuries and can affect all age groups. It is possible to use preventive measures to significantly decrease the likelihood of falls. There are many simple measures which can make your home safer and prevent falls. OUTDOORS  Repair cracks and edges of walkways and driveways.  Remove high doorway thresholds.  Trim shrubbery on the main path into your home.  Have good outside lighting.  Clear walkways of tools, rocks, debris, and clutter.  Check that handrails are not broken and are securely fastened. Both sides of steps should have handrails.  Have leaves, snow, and ice cleared regularly.  Use sand or salt on walkways during winter months.  In the garage, clean up grease or oil spills. BATHROOM  Install night lights.  Install grab bars by the toilet and in the  tub and shower.  Use non-skid mats or decals in the tub or shower.  Place a plastic non-slip stool in the shower to sit on, if needed.  Keep floors dry and clean up all water on the floor immediately.  Remove soap buildup in the tub or shower on a  regular basis.  Secure bath mats with non-slip, double-sided rug tape.  Remove throw rugs and tripping hazards from the floors. BEDROOMS  Install night lights.  Make sure a bedside light is easy to reach.  Do not use oversized bedding.  Keep a telephone by your bedside.  Have a firm chair with side arms to use for getting dressed.  Remove throw rugs and tripping hazards from the floor. KITCHEN  Keep handles on pots and pans turned toward the center of the stove. Use back burners when possible.  Clean up spills quickly and allow time for drying.  Avoid walking on wet floors.  Avoid hot utensils and knives.  Position shelves so they are not too high or low.  Place commonly used objects within easy reach.  If necessary, use a sturdy step stool with a grab bar when reaching.  Keep electrical cables out of the way.  Do not use floor polish or wax that makes floors slippery. If you must use wax, use non-skid floor wax.  Remove throw rugs and tripping hazards from the floor. STAIRWAYS  Never leave objects on stairs.  Place handrails on both sides of stairways and use them. Fix any loose handrails. Make sure handrails on both sides of the stairways are as long as the stairs.  Check carpeting to make sure it is firmly attached along stairs. Make repairs to worn or loose carpet promptly.  Avoid placing throw rugs at the top or bottom of stairways, or properly secure the rug with carpet tape to prevent slippage. Get rid of throw rugs, if possible.  Have an electrician put in a light switch at the top and bottom of the stairs. OTHER FALL PREVENTION TIPS  Wear low-heel or rubber-soled shoes that are supportive and fit well. Wear closed toe shoes.  When using a stepladder, make sure it is fully opened and both spreaders are firmly locked. Do not climb a closed stepladder.  Add color or contrast paint or tape to grab bars and handrails in your home. Place contrasting color  strips on first and last steps.  Learn and use mobility aids as needed. Install an electrical emergency response system.  Turn on lights to avoid dark areas. Replace light bulbs that burn out immediately. Get light switches that glow.  Arrange furniture to create clear pathways. Keep furniture in the same place.  Firmly attach carpet with non-skid or double-sided tape.  Eliminate uneven floor surfaces.  Select a carpet pattern that does not visually hide the edge of steps.  Be aware of all pets. OTHER HOME SAFETY TIPS  Set the water temperature for 120 F (48.8 C).  Keep emergency numbers on or near the telephone.  Keep smoke detectors on every level of the home and near sleeping areas. Document Released: 04/10/2002 Document Revised: 10/20/2011 Document Reviewed: 07/10/2011 [facility] Patient Information 2014 [place].

## 2013-10-11 NOTE — ED Notes (Signed)
Patient reports Right knee replacement on 08/02/13, continues to have limited movement and limited weight bearing. Patient state @1900  last night her left knee/leg made a "pop" noise and her leg buckled. Patient is unable to bear weight on her left leg. Patient took 1 percocet (10/325) at 0230.

## 2013-10-12 NOTE — ED Provider Notes (Signed)
Medical screening examination/treatment/procedure(s) were performed by non-physician practitioner and as supervising physician I was immediately available for consultation/collaboration.   EKG Interpretation None        Julianne Rice, MD 10/12/13 [phone]

## 2013-11-20 ENCOUNTER — Telehealth: Payer: Self-pay | Admitting: *Deleted

## 2013-11-20 ENCOUNTER — Encounter: Payer: Self-pay | Admitting: Podiatry

## 2013-11-20 ENCOUNTER — Ambulatory Visit (INDEPENDENT_AMBULATORY_CARE_PROVIDER_SITE_OTHER): Payer: No Typology Code available for payment source | Admitting: Podiatry

## 2013-11-20 VITALS — BP 126/71 | HR 71 | Resp 18

## 2013-11-20 DIAGNOSIS — E119 Type 2 diabetes mellitus without complications: Secondary | ICD-10-CM

## 2013-11-20 DIAGNOSIS — B351 Tinea unguium: Secondary | ICD-10-CM

## 2013-11-20 NOTE — Telephone Encounter (Signed)
Entered in error

## 2013-11-20 NOTE — Progress Notes (Signed)
   Subjective:    Patient ID: Christina [name], female    DOB: [birth year], 63 y.o.   MRN: [id number]  HPI I AM A DIABETIC AND HAVE BEEN FOR ABOUT 5 YEARS OR MORE AND I AM HERE TO GET MY TOENAILS TRIMMED UP AND THICK AND DICOLORED AND I HAD SURGERY ON MY RIGHT KNEE IN April AND SOME OF THE SUTURES CAME UNDONE AND IT DOES DRAIN AND I HAVE SOME REDNESS AND I WILL HAVE TO HAVE A SKIN GRAPH DONE This patient presents complaining of thick elongated toenails. She is concerned that they are uncomfortable because they're long and also concerned about a possible treatment.   Review of Systems  Musculoskeletal:       Right hip does hurt and they are checking my left knee  All other systems reviewed and are negative.      Objective:   Physical Exam  Orientated x3 black female presents with her husband  Vascular: DP and PT pulses 2/4 bilaterally Pitting edema ankles/feet bilaterally  Neurological: Ankle reflexes equal and reactive bilaterally Vibratory sensation intact bilaterally Sensation to 10 g monofilament wire intact 5/5 bilaterally  Dermatological: Elongated toenails 6-10 Discolored hypertrophic toenails 1, 3, 5 bilaterally Plantar keratoses hallux and fifth MPJ bilaterally  Musculoskeletal: HAV deformity right and     Assessment & Plan:   Assessment: Pitting edema bilaterally Satisfactory neurovascular status Onychomycosis x6 Plantar keratoses x4  Plan: Nails 6-10 are debrided Nail fragments submitted for PAS and fungal culture as patient is interested in treating onychomycoses including the use of oral medication  Reappoint up on receipt of lab

## 2013-11-20 NOTE — Patient Instructions (Signed)
Diabetes and Foot Care Diabetes may cause you to have problems because of poor blood supply (circulation) to your feet and legs. This may cause the skin on your feet to become thinner, break easier, and heal more slowly. Your skin may become dry, and the skin may peel and crack. You may also have nerve damage in your legs and feet causing decreased feeling in them. You may not notice minor injuries to your feet that could lead to infections or more serious problems. Taking care of your feet is one of the most important things you can do for yourself.  HOME CARE INSTRUCTIONS  Wear shoes at all times, even in the house. Do not go barefoot. Bare feet are easily injured.  Check your feet daily for blisters, cuts, and redness. If you cannot see the bottom of your feet, use a mirror or ask someone for help.  Wash your feet with warm water (do not use hot water) and mild soap. Then pat your feet and the areas between your toes until they are completely dry. Do not soak your feet as this can dry your skin.  Apply a moisturizing lotion or petroleum jelly (that does not contain alcohol and is unscented) to the skin on your feet and to dry, brittle toenails. Do not apply lotion between your toes.  Trim your toenails straight across. Do not dig under them or around the cuticle. File the edges of your nails with an emery board or nail file.  Do not cut corns or calluses or try to remove them with medicine.  Wear clean socks or stockings every day. Make sure they are not too tight. Do not wear knee-high stockings since they may decrease blood flow to your legs.  Wear shoes that fit properly and have enough cushioning. To break in new shoes, wear them for just a few hours a day. This prevents you from injuring your feet. Always look in your shoes before you put them on to be sure there are no objects inside.  Do not cross your legs. This may decrease the blood flow to your feet.  If you find a minor scrape,  cut, or break in the skin on your feet, keep it and the skin around it clean and dry. These areas may be cleansed with mild soap and water. Do not cleanse the area with peroxide, alcohol, or iodine.  When you remove an adhesive bandage, be sure not to damage the skin around it.  If you have a wound, look at it several times a day to make sure it is healing.  Do not use heating pads or hot water bottles. They may burn your skin. If you have lost feeling in your feet or legs, you may not know it is happening until it is too late.  Make sure your health care provider performs a complete foot exam at least annually or more often if you have foot problems. Report any cuts, sores, or bruises to your health care provider immediately. SEEK MEDICAL CARE IF:   You have an injury that is not healing.  You have cuts or breaks in the skin.  You have an ingrown nail.  You notice redness on your legs or feet.  You feel burning or tingling in your legs or feet.  You have pain or cramps in your legs and feet.  Your legs or feet are numb.  Your feet always feel cold. SEEK IMMEDIATE MEDICAL CARE IF:   There is increasing redness,   swelling, or pain in or around a wound.  There is a red line that goes up your leg.  Pus is coming from a wound.  You develop a fever or as directed by your health care provider.  You notice a bad smell coming from an ulcer or wound. Document Released: 04/17/2000 Document Revised: 12/21/2012 Document Reviewed: 09/27/2012 [employer] Patient Information 2015 [employer]. This information is not intended to replace advice given to you by your health care provider. Make sure you discuss any questions you have with your health care provider.  

## 2013-11-21 ENCOUNTER — Telehealth: Payer: Self-pay | Admitting: *Deleted

## 2013-11-21 ENCOUNTER — Encounter: Payer: Self-pay | Admitting: Podiatry

## 2013-11-21 NOTE — Telephone Encounter (Signed)
Mycotic toenails sent to [facility] for fungal culture/ pas.  [facility] [address] [place] [address] Ph: [phone]

## 2013-12-05 ENCOUNTER — Encounter: Payer: Self-pay | Admitting: Podiatry

## 2014-02-08 ENCOUNTER — Telehealth: Payer: Self-pay | Admitting: *Deleted

## 2014-02-08 NOTE — Telephone Encounter (Signed)
I called and informed her that her culture came back negative for fungus per Dr. Amalia Hailey.  She stated, "Yes, I am so happy my body is purified, I just had a colonoscopy and everything is coming back negative!  Thank you so much for calling, be blessed."

## 2014-02-09 ENCOUNTER — Encounter: Payer: Self-pay | Admitting: Podiatry

## 2015-05-23 DIAGNOSIS — E1165 Type 2 diabetes mellitus with hyperglycemia: Secondary | ICD-10-CM | POA: Diagnosis not present

## 2015-05-23 DIAGNOSIS — I10 Essential (primary) hypertension: Secondary | ICD-10-CM | POA: Diagnosis not present

## 2015-05-23 DIAGNOSIS — E7800 Pure hypercholesterolemia, unspecified: Secondary | ICD-10-CM | POA: Diagnosis not present

## 2015-05-23 DIAGNOSIS — Z85038 Personal history of other malignant neoplasm of large intestine: Secondary | ICD-10-CM | POA: Diagnosis not present

## 2015-05-23 DIAGNOSIS — N182 Chronic kidney disease, stage 2 (mild): Secondary | ICD-10-CM | POA: Diagnosis not present

## 2015-05-23 DIAGNOSIS — Z7984 Long term (current) use of oral hypoglycemic drugs: Secondary | ICD-10-CM | POA: Diagnosis not present

## 2015-05-23 DIAGNOSIS — Z23 Encounter for immunization: Secondary | ICD-10-CM | POA: Diagnosis not present

## 2015-12-19 DIAGNOSIS — Z1231 Encounter for screening mammogram for malignant neoplasm of breast: Secondary | ICD-10-CM | POA: Diagnosis not present

## 2015-12-26 DIAGNOSIS — Z0000 Encounter for general adult medical examination without abnormal findings: Secondary | ICD-10-CM | POA: Diagnosis not present

## 2015-12-26 DIAGNOSIS — Z7984 Long term (current) use of oral hypoglycemic drugs: Secondary | ICD-10-CM | POA: Diagnosis not present

## 2015-12-26 DIAGNOSIS — Z13820 Encounter for screening for osteoporosis: Secondary | ICD-10-CM | POA: Diagnosis not present

## 2015-12-26 DIAGNOSIS — Z85038 Personal history of other malignant neoplasm of large intestine: Secondary | ICD-10-CM | POA: Diagnosis not present

## 2015-12-26 DIAGNOSIS — E1165 Type 2 diabetes mellitus with hyperglycemia: Secondary | ICD-10-CM | POA: Diagnosis not present

## 2015-12-26 DIAGNOSIS — N182 Chronic kidney disease, stage 2 (mild): Secondary | ICD-10-CM | POA: Diagnosis not present

## 2015-12-26 DIAGNOSIS — I10 Essential (primary) hypertension: Secondary | ICD-10-CM | POA: Diagnosis not present

## 2015-12-26 DIAGNOSIS — E7800 Pure hypercholesterolemia, unspecified: Secondary | ICD-10-CM | POA: Diagnosis not present

## 2016-01-01 DIAGNOSIS — M8589 Other specified disorders of bone density and structure, multiple sites: Secondary | ICD-10-CM | POA: Diagnosis not present

## 2016-01-02 DIAGNOSIS — E119 Type 2 diabetes mellitus without complications: Secondary | ICD-10-CM | POA: Diagnosis not present

## 2016-01-02 DIAGNOSIS — H524 Presbyopia: Secondary | ICD-10-CM | POA: Diagnosis not present

## 2016-01-02 DIAGNOSIS — H2513 Age-related nuclear cataract, bilateral: Secondary | ICD-10-CM | POA: Diagnosis not present

## 2016-01-02 DIAGNOSIS — H25013 Cortical age-related cataract, bilateral: Secondary | ICD-10-CM | POA: Diagnosis not present

## 2016-01-02 DIAGNOSIS — H5203 Hypermetropia, bilateral: Secondary | ICD-10-CM | POA: Diagnosis not present

## 2016-02-24 ENCOUNTER — Other Ambulatory Visit (HOSPITAL_COMMUNITY)
Admission: RE | Admit: 2016-02-24 | Discharge: 2016-02-24 | Disposition: A | Discharge status: Home / Self Care | Payer: Commercial Managed Care - HMO | Source: Ambulatory Visit | Attending: Physician Assistant | Admitting: Physician Assistant

## 2016-02-24 ENCOUNTER — Other Ambulatory Visit: Payer: Self-pay | Admitting: Physician Assistant

## 2016-02-24 DIAGNOSIS — Z124 Encounter for screening for malignant neoplasm of cervix: Secondary | ICD-10-CM | POA: Diagnosis not present

## 2016-02-24 DIAGNOSIS — Z23 Encounter for immunization: Secondary | ICD-10-CM | POA: Diagnosis not present

## 2016-02-24 DIAGNOSIS — E7800 Pure hypercholesterolemia, unspecified: Secondary | ICD-10-CM | POA: Diagnosis not present

## 2016-02-26 LAB — CYTOLOGY - PAP: Diagnosis: NEGATIVE

## 2016-06-18 DIAGNOSIS — M9903 Segmental and somatic dysfunction of lumbar region: Secondary | ICD-10-CM | POA: Diagnosis not present

## 2016-06-18 DIAGNOSIS — M545 Low back pain: Secondary | ICD-10-CM | POA: Diagnosis not present

## 2016-07-16 DIAGNOSIS — M545 Low back pain: Secondary | ICD-10-CM | POA: Diagnosis not present

## 2016-07-16 DIAGNOSIS — M9903 Segmental and somatic dysfunction of lumbar region: Secondary | ICD-10-CM | POA: Diagnosis not present

## 2016-08-20 DIAGNOSIS — M545 Low back pain: Secondary | ICD-10-CM | POA: Diagnosis not present

## 2016-08-20 DIAGNOSIS — M5136 Other intervertebral disc degeneration, lumbar region: Secondary | ICD-10-CM | POA: Diagnosis not present

## 2016-08-20 DIAGNOSIS — M9903 Segmental and somatic dysfunction of lumbar region: Secondary | ICD-10-CM | POA: Diagnosis not present

## 2016-08-20 DIAGNOSIS — M47816 Spondylosis without myelopathy or radiculopathy, lumbar region: Secondary | ICD-10-CM | POA: Diagnosis not present

## 2016-09-10 DIAGNOSIS — M545 Low back pain: Secondary | ICD-10-CM | POA: Diagnosis not present

## 2016-09-10 DIAGNOSIS — M9903 Segmental and somatic dysfunction of lumbar region: Secondary | ICD-10-CM | POA: Diagnosis not present

## 2016-09-10 DIAGNOSIS — M47816 Spondylosis without myelopathy or radiculopathy, lumbar region: Secondary | ICD-10-CM | POA: Diagnosis not present

## 2016-09-10 DIAGNOSIS — M5136 Other intervertebral disc degeneration, lumbar region: Secondary | ICD-10-CM | POA: Diagnosis not present

## 2016-10-13 DIAGNOSIS — M47816 Spondylosis without myelopathy or radiculopathy, lumbar region: Secondary | ICD-10-CM | POA: Diagnosis not present

## 2016-10-13 DIAGNOSIS — M9903 Segmental and somatic dysfunction of lumbar region: Secondary | ICD-10-CM | POA: Diagnosis not present

## 2016-10-13 DIAGNOSIS — M5136 Other intervertebral disc degeneration, lumbar region: Secondary | ICD-10-CM | POA: Diagnosis not present

## 2016-10-13 DIAGNOSIS — M545 Low back pain: Secondary | ICD-10-CM | POA: Diagnosis not present

## 2016-11-19 DIAGNOSIS — M5136 Other intervertebral disc degeneration, lumbar region: Secondary | ICD-10-CM | POA: Diagnosis not present

## 2016-11-19 DIAGNOSIS — M9903 Segmental and somatic dysfunction of lumbar region: Secondary | ICD-10-CM | POA: Diagnosis not present

## 2016-11-19 DIAGNOSIS — M47816 Spondylosis without myelopathy or radiculopathy, lumbar region: Secondary | ICD-10-CM | POA: Diagnosis not present

## 2016-11-19 DIAGNOSIS — M545 Low back pain: Secondary | ICD-10-CM | POA: Diagnosis not present

## 2016-12-21 DIAGNOSIS — M545 Low back pain: Secondary | ICD-10-CM | POA: Diagnosis not present

## 2016-12-21 DIAGNOSIS — M9903 Segmental and somatic dysfunction of lumbar region: Secondary | ICD-10-CM | POA: Diagnosis not present

## 2016-12-21 DIAGNOSIS — M47816 Spondylosis without myelopathy or radiculopathy, lumbar region: Secondary | ICD-10-CM | POA: Diagnosis not present

## 2016-12-21 DIAGNOSIS — Z1231 Encounter for screening mammogram for malignant neoplasm of breast: Secondary | ICD-10-CM | POA: Diagnosis not present

## 2016-12-21 DIAGNOSIS — M5136 Other intervertebral disc degeneration, lumbar region: Secondary | ICD-10-CM | POA: Diagnosis not present

## 2017-01-01 DIAGNOSIS — I10 Essential (primary) hypertension: Secondary | ICD-10-CM | POA: Diagnosis not present

## 2017-01-01 DIAGNOSIS — Z0000 Encounter for general adult medical examination without abnormal findings: Secondary | ICD-10-CM | POA: Diagnosis not present

## 2017-01-01 DIAGNOSIS — L659 Nonscarring hair loss, unspecified: Secondary | ICD-10-CM | POA: Diagnosis not present

## 2017-01-01 DIAGNOSIS — Z1159 Encounter for screening for other viral diseases: Secondary | ICD-10-CM | POA: Diagnosis not present

## 2017-01-01 DIAGNOSIS — Z85038 Personal history of other malignant neoplasm of large intestine: Secondary | ICD-10-CM | POA: Diagnosis not present

## 2017-01-01 DIAGNOSIS — E7800 Pure hypercholesterolemia, unspecified: Secondary | ICD-10-CM | POA: Diagnosis not present

## 2017-01-01 DIAGNOSIS — N182 Chronic kidney disease, stage 2 (mild): Secondary | ICD-10-CM | POA: Diagnosis not present

## 2017-01-01 DIAGNOSIS — M8580 Other specified disorders of bone density and structure, unspecified site: Secondary | ICD-10-CM | POA: Diagnosis not present

## 2017-01-01 DIAGNOSIS — Z23 Encounter for immunization: Secondary | ICD-10-CM | POA: Diagnosis not present

## 2017-01-01 DIAGNOSIS — E1165 Type 2 diabetes mellitus with hyperglycemia: Secondary | ICD-10-CM | POA: Diagnosis not present

## 2017-01-07 DIAGNOSIS — H25013 Cortical age-related cataract, bilateral: Secondary | ICD-10-CM | POA: Diagnosis not present

## 2017-01-07 DIAGNOSIS — H524 Presbyopia: Secondary | ICD-10-CM | POA: Diagnosis not present

## 2017-01-07 DIAGNOSIS — H5202 Hypermetropia, left eye: Secondary | ICD-10-CM | POA: Diagnosis not present

## 2017-01-07 DIAGNOSIS — E119 Type 2 diabetes mellitus without complications: Secondary | ICD-10-CM | POA: Diagnosis not present

## 2017-01-07 DIAGNOSIS — H2513 Age-related nuclear cataract, bilateral: Secondary | ICD-10-CM | POA: Diagnosis not present

## 2017-01-11 DIAGNOSIS — M545 Low back pain: Secondary | ICD-10-CM | POA: Diagnosis not present

## 2017-01-11 DIAGNOSIS — M5136 Other intervertebral disc degeneration, lumbar region: Secondary | ICD-10-CM | POA: Diagnosis not present

## 2017-01-11 DIAGNOSIS — M47816 Spondylosis without myelopathy or radiculopathy, lumbar region: Secondary | ICD-10-CM | POA: Diagnosis not present

## 2017-01-11 DIAGNOSIS — M9903 Segmental and somatic dysfunction of lumbar region: Secondary | ICD-10-CM | POA: Diagnosis not present

## 2017-02-04 DIAGNOSIS — M9903 Segmental and somatic dysfunction of lumbar region: Secondary | ICD-10-CM | POA: Diagnosis not present

## 2017-02-04 DIAGNOSIS — M47816 Spondylosis without myelopathy or radiculopathy, lumbar region: Secondary | ICD-10-CM | POA: Diagnosis not present

## 2017-02-04 DIAGNOSIS — M5136 Other intervertebral disc degeneration, lumbar region: Secondary | ICD-10-CM | POA: Diagnosis not present

## 2017-02-04 DIAGNOSIS — M545 Low back pain: Secondary | ICD-10-CM | POA: Diagnosis not present

## 2017-03-11 DIAGNOSIS — M545 Low back pain: Secondary | ICD-10-CM | POA: Diagnosis not present

## 2017-03-11 DIAGNOSIS — M5136 Other intervertebral disc degeneration, lumbar region: Secondary | ICD-10-CM | POA: Diagnosis not present

## 2017-03-11 DIAGNOSIS — M47816 Spondylosis without myelopathy or radiculopathy, lumbar region: Secondary | ICD-10-CM | POA: Diagnosis not present

## 2017-03-11 DIAGNOSIS — M9903 Segmental and somatic dysfunction of lumbar region: Secondary | ICD-10-CM | POA: Diagnosis not present

## 2017-03-15 DIAGNOSIS — Z23 Encounter for immunization: Secondary | ICD-10-CM | POA: Diagnosis not present

## 2017-04-19 DIAGNOSIS — M9903 Segmental and somatic dysfunction of lumbar region: Secondary | ICD-10-CM | POA: Diagnosis not present

## 2017-04-19 DIAGNOSIS — M5136 Other intervertebral disc degeneration, lumbar region: Secondary | ICD-10-CM | POA: Diagnosis not present

## 2017-04-19 DIAGNOSIS — M545 Low back pain: Secondary | ICD-10-CM | POA: Diagnosis not present

## 2017-04-19 DIAGNOSIS — M47816 Spondylosis without myelopathy or radiculopathy, lumbar region: Secondary | ICD-10-CM | POA: Diagnosis not present

## 2017-04-21 DIAGNOSIS — Z85038 Personal history of other malignant neoplasm of large intestine: Secondary | ICD-10-CM | POA: Diagnosis not present

## 2017-04-21 DIAGNOSIS — K529 Noninfective gastroenteritis and colitis, unspecified: Secondary | ICD-10-CM | POA: Diagnosis not present

## 2017-04-21 DIAGNOSIS — K625 Hemorrhage of anus and rectum: Secondary | ICD-10-CM | POA: Diagnosis not present

## 2017-04-21 DIAGNOSIS — R197 Diarrhea, unspecified: Secondary | ICD-10-CM | POA: Diagnosis not present

## 2017-04-30 DIAGNOSIS — K921 Melena: Secondary | ICD-10-CM | POA: Diagnosis not present

## 2017-04-30 DIAGNOSIS — Z85038 Personal history of other malignant neoplasm of large intestine: Secondary | ICD-10-CM | POA: Diagnosis not present

## 2017-05-13 DIAGNOSIS — M5136 Other intervertebral disc degeneration, lumbar region: Secondary | ICD-10-CM | POA: Diagnosis not present

## 2017-05-13 DIAGNOSIS — M545 Low back pain: Secondary | ICD-10-CM | POA: Diagnosis not present

## 2017-05-13 DIAGNOSIS — M47816 Spondylosis without myelopathy or radiculopathy, lumbar region: Secondary | ICD-10-CM | POA: Diagnosis not present

## 2017-05-13 DIAGNOSIS — M9903 Segmental and somatic dysfunction of lumbar region: Secondary | ICD-10-CM | POA: Diagnosis not present

## 2017-06-08 DIAGNOSIS — K642 Third degree hemorrhoids: Secondary | ICD-10-CM | POA: Diagnosis not present

## 2017-06-08 DIAGNOSIS — D123 Benign neoplasm of transverse colon: Secondary | ICD-10-CM | POA: Diagnosis not present

## 2017-06-08 DIAGNOSIS — K921 Melena: Secondary | ICD-10-CM | POA: Diagnosis not present

## 2017-06-08 DIAGNOSIS — D124 Benign neoplasm of descending colon: Secondary | ICD-10-CM | POA: Diagnosis not present

## 2018-01-10 DIAGNOSIS — H524 Presbyopia: Secondary | ICD-10-CM | POA: Diagnosis not present

## 2018-01-10 DIAGNOSIS — H5203 Hypermetropia, bilateral: Secondary | ICD-10-CM | POA: Diagnosis not present

## 2018-01-10 DIAGNOSIS — Z7984 Long term (current) use of oral hypoglycemic drugs: Secondary | ICD-10-CM | POA: Diagnosis not present

## 2018-01-10 DIAGNOSIS — H25013 Cortical age-related cataract, bilateral: Secondary | ICD-10-CM | POA: Diagnosis not present

## 2018-01-10 DIAGNOSIS — E119 Type 2 diabetes mellitus without complications: Secondary | ICD-10-CM | POA: Diagnosis not present

## 2018-01-10 DIAGNOSIS — H2513 Age-related nuclear cataract, bilateral: Secondary | ICD-10-CM | POA: Diagnosis not present

## 2018-01-11 DIAGNOSIS — Z1231 Encounter for screening mammogram for malignant neoplasm of breast: Secondary | ICD-10-CM | POA: Diagnosis not present

## 2018-01-11 DIAGNOSIS — M8589 Other specified disorders of bone density and structure, multiple sites: Secondary | ICD-10-CM | POA: Diagnosis not present

## 2018-01-18 DIAGNOSIS — Z0000 Encounter for general adult medical examination without abnormal findings: Secondary | ICD-10-CM | POA: Diagnosis not present

## 2018-01-18 DIAGNOSIS — E1165 Type 2 diabetes mellitus with hyperglycemia: Secondary | ICD-10-CM | POA: Diagnosis not present

## 2018-01-18 DIAGNOSIS — Z23 Encounter for immunization: Secondary | ICD-10-CM | POA: Diagnosis not present

## 2018-01-18 DIAGNOSIS — E7800 Pure hypercholesterolemia, unspecified: Secondary | ICD-10-CM | POA: Diagnosis not present

## 2018-01-18 DIAGNOSIS — I10 Essential (primary) hypertension: Secondary | ICD-10-CM | POA: Diagnosis not present

## 2018-01-18 DIAGNOSIS — Z6841 Body Mass Index (BMI) 40.0 and over, adult: Secondary | ICD-10-CM | POA: Diagnosis not present

## 2018-01-18 DIAGNOSIS — M8580 Other specified disorders of bone density and structure, unspecified site: Secondary | ICD-10-CM | POA: Diagnosis not present

## 2018-01-18 DIAGNOSIS — N182 Chronic kidney disease, stage 2 (mild): Secondary | ICD-10-CM | POA: Diagnosis not present

## 2018-05-04 HISTORY — PX: CYST EXCISION: SHX5701

## 2018-05-12 DIAGNOSIS — M5441 Lumbago with sciatica, right side: Secondary | ICD-10-CM | POA: Diagnosis not present

## 2018-05-12 DIAGNOSIS — M5442 Lumbago with sciatica, left side: Secondary | ICD-10-CM | POA: Diagnosis not present

## 2018-05-12 DIAGNOSIS — R32 Unspecified urinary incontinence: Secondary | ICD-10-CM | POA: Diagnosis not present

## 2018-05-12 DIAGNOSIS — N3000 Acute cystitis without hematuria: Secondary | ICD-10-CM | POA: Diagnosis not present

## 2018-05-12 DIAGNOSIS — R3989 Other symptoms and signs involving the genitourinary system: Secondary | ICD-10-CM | POA: Diagnosis not present

## 2018-05-12 DIAGNOSIS — G8929 Other chronic pain: Secondary | ICD-10-CM | POA: Diagnosis not present

## 2018-05-14 DIAGNOSIS — Z880 Allergy status to penicillin: Secondary | ICD-10-CM | POA: Diagnosis not present

## 2018-05-14 DIAGNOSIS — Z85038 Personal history of other malignant neoplasm of large intestine: Secondary | ICD-10-CM | POA: Diagnosis not present

## 2018-05-14 DIAGNOSIS — Z888 Allergy status to other drugs, medicaments and biological substances status: Secondary | ICD-10-CM | POA: Diagnosis not present

## 2018-05-14 DIAGNOSIS — R7989 Other specified abnormal findings of blood chemistry: Secondary | ICD-10-CM | POA: Diagnosis not present

## 2018-05-14 DIAGNOSIS — I10 Essential (primary) hypertension: Secondary | ICD-10-CM | POA: Diagnosis not present

## 2018-05-14 DIAGNOSIS — Z7982 Long term (current) use of aspirin: Secondary | ICD-10-CM | POA: Diagnosis not present

## 2018-05-14 DIAGNOSIS — Z7984 Long term (current) use of oral hypoglycemic drugs: Secondary | ICD-10-CM | POA: Diagnosis not present

## 2018-05-14 DIAGNOSIS — N3289 Other specified disorders of bladder: Secondary | ICD-10-CM | POA: Diagnosis not present

## 2018-05-14 DIAGNOSIS — E785 Hyperlipidemia, unspecified: Secondary | ICD-10-CM | POA: Diagnosis not present

## 2018-05-14 DIAGNOSIS — N281 Cyst of kidney, acquired: Secondary | ICD-10-CM | POA: Diagnosis not present

## 2018-05-14 DIAGNOSIS — R109 Unspecified abdominal pain: Secondary | ICD-10-CM | POA: Diagnosis not present

## 2018-05-14 DIAGNOSIS — E1169 Type 2 diabetes mellitus with other specified complication: Secondary | ICD-10-CM | POA: Diagnosis not present

## 2018-05-14 DIAGNOSIS — K649 Unspecified hemorrhoids: Secondary | ICD-10-CM | POA: Diagnosis not present

## 2018-05-19 DIAGNOSIS — E119 Type 2 diabetes mellitus without complications: Secondary | ICD-10-CM | POA: Diagnosis not present

## 2018-05-19 DIAGNOSIS — I10 Essential (primary) hypertension: Secondary | ICD-10-CM | POA: Diagnosis not present

## 2018-05-19 DIAGNOSIS — Z6841 Body Mass Index (BMI) 40.0 and over, adult: Secondary | ICD-10-CM | POA: Diagnosis not present

## 2018-05-19 DIAGNOSIS — Z7984 Long term (current) use of oral hypoglycemic drugs: Secondary | ICD-10-CM | POA: Diagnosis not present

## 2018-05-19 DIAGNOSIS — Z008 Encounter for other general examination: Secondary | ICD-10-CM | POA: Diagnosis not present

## 2018-05-19 DIAGNOSIS — Z85038 Personal history of other malignant neoplasm of large intestine: Secondary | ICD-10-CM | POA: Diagnosis not present

## 2018-05-19 DIAGNOSIS — Z0000 Encounter for general adult medical examination without abnormal findings: Secondary | ICD-10-CM | POA: Diagnosis not present

## 2018-05-23 DIAGNOSIS — M4310 Spondylolisthesis, site unspecified: Secondary | ICD-10-CM | POA: Insufficient documentation

## 2018-05-23 DIAGNOSIS — M4150 Other secondary scoliosis, site unspecified: Secondary | ICD-10-CM | POA: Insufficient documentation

## 2018-05-23 DIAGNOSIS — M545 Low back pain: Secondary | ICD-10-CM | POA: Diagnosis not present

## 2018-05-23 DIAGNOSIS — M4180 Other forms of scoliosis, site unspecified: Secondary | ICD-10-CM | POA: Diagnosis not present

## 2018-05-23 DIAGNOSIS — Z6841 Body Mass Index (BMI) 40.0 and over, adult: Secondary | ICD-10-CM | POA: Diagnosis not present

## 2018-12-28 DIAGNOSIS — M79642 Pain in left hand: Secondary | ICD-10-CM | POA: Insufficient documentation

## 2018-12-28 DIAGNOSIS — M25541 Pain in joints of right hand: Secondary | ICD-10-CM | POA: Insufficient documentation

## 2019-01-12 ENCOUNTER — Emergency Department (HOSPITAL_COMMUNITY): Payer: Medicare HMO

## 2019-01-12 ENCOUNTER — Encounter (HOSPITAL_COMMUNITY): Payer: Self-pay

## 2019-01-12 ENCOUNTER — Inpatient Hospital Stay (HOSPITAL_COMMUNITY)
Admission: EM | Admit: 2019-01-12 | Discharge: 2019-01-14 | DRG: 392 | Disposition: A | Discharge status: Home Health | Payer: Medicare HMO | Attending: Internal Medicine | Admitting: Internal Medicine

## 2019-01-12 ENCOUNTER — Other Ambulatory Visit: Payer: Self-pay

## 2019-01-12 DIAGNOSIS — Z20828 Contact with and (suspected) exposure to other viral communicable diseases: Secondary | ICD-10-CM | POA: Diagnosis not present

## 2019-01-12 DIAGNOSIS — I10 Essential (primary) hypertension: Secondary | ICD-10-CM | POA: Diagnosis not present

## 2019-01-12 DIAGNOSIS — Z7984 Long term (current) use of oral hypoglycemic drugs: Secondary | ICD-10-CM

## 2019-01-12 DIAGNOSIS — E860 Dehydration: Secondary | ICD-10-CM | POA: Diagnosis present

## 2019-01-12 DIAGNOSIS — E119 Type 2 diabetes mellitus without complications: Secondary | ICD-10-CM | POA: Diagnosis not present

## 2019-01-12 DIAGNOSIS — Z03818 Encounter for observation for suspected exposure to other biological agents ruled out: Secondary | ICD-10-CM | POA: Diagnosis not present

## 2019-01-12 DIAGNOSIS — Z880 Allergy status to penicillin: Secondary | ICD-10-CM | POA: Diagnosis not present

## 2019-01-12 DIAGNOSIS — K529 Noninfective gastroenteritis and colitis, unspecified: Secondary | ICD-10-CM | POA: Diagnosis not present

## 2019-01-12 DIAGNOSIS — Z79899 Other long term (current) drug therapy: Secondary | ICD-10-CM | POA: Diagnosis not present

## 2019-01-12 DIAGNOSIS — R1110 Vomiting, unspecified: Secondary | ICD-10-CM | POA: Diagnosis present

## 2019-01-12 DIAGNOSIS — I700 Atherosclerosis of aorta: Secondary | ICD-10-CM | POA: Diagnosis present

## 2019-01-12 DIAGNOSIS — E279 Disorder of adrenal gland, unspecified: Secondary | ICD-10-CM | POA: Diagnosis present

## 2019-01-12 DIAGNOSIS — N289 Disorder of kidney and ureter, unspecified: Secondary | ICD-10-CM | POA: Diagnosis not present

## 2019-01-12 DIAGNOSIS — K439 Ventral hernia without obstruction or gangrene: Secondary | ICD-10-CM | POA: Diagnosis not present

## 2019-01-12 DIAGNOSIS — K432 Incisional hernia without obstruction or gangrene: Secondary | ICD-10-CM | POA: Diagnosis not present

## 2019-01-12 DIAGNOSIS — Z85038 Personal history of other malignant neoplasm of large intestine: Secondary | ICD-10-CM

## 2019-01-12 DIAGNOSIS — R531 Weakness: Secondary | ICD-10-CM | POA: Diagnosis not present

## 2019-01-12 DIAGNOSIS — Z6841 Body Mass Index (BMI) 40.0 and over, adult: Secondary | ICD-10-CM | POA: Diagnosis not present

## 2019-01-12 DIAGNOSIS — Z96651 Presence of right artificial knee joint: Secondary | ICD-10-CM | POA: Diagnosis present

## 2019-01-12 DIAGNOSIS — R197 Diarrhea, unspecified: Secondary | ICD-10-CM | POA: Diagnosis not present

## 2019-01-12 DIAGNOSIS — R112 Nausea with vomiting, unspecified: Secondary | ICD-10-CM | POA: Diagnosis not present

## 2019-01-12 DIAGNOSIS — Z8249 Family history of ischemic heart disease and other diseases of the circulatory system: Secondary | ICD-10-CM

## 2019-01-12 DIAGNOSIS — R1084 Generalized abdominal pain: Secondary | ICD-10-CM

## 2019-01-12 DIAGNOSIS — Z888 Allergy status to other drugs, medicaments and biological substances status: Secondary | ICD-10-CM

## 2019-01-12 DIAGNOSIS — R52 Pain, unspecified: Secondary | ICD-10-CM | POA: Diagnosis not present

## 2019-01-12 DIAGNOSIS — R109 Unspecified abdominal pain: Secondary | ICD-10-CM | POA: Diagnosis not present

## 2019-01-12 DIAGNOSIS — E278 Other specified disorders of adrenal gland: Secondary | ICD-10-CM | POA: Diagnosis not present

## 2019-01-12 DIAGNOSIS — N179 Acute kidney failure, unspecified: Secondary | ICD-10-CM | POA: Diagnosis not present

## 2019-01-12 DIAGNOSIS — E1165 Type 2 diabetes mellitus with hyperglycemia: Secondary | ICD-10-CM | POA: Diagnosis not present

## 2019-01-12 LAB — SARS CORONAVIRUS 2 BY RT PCR (HOSPITAL ORDER, PERFORMED IN ~~LOC~~ HOSPITAL LAB): SARS Coronavirus 2: NEGATIVE

## 2019-01-12 LAB — URINALYSIS, ROUTINE W REFLEX MICROSCOPIC
Bilirubin Urine: NEGATIVE
Glucose, UA: NEGATIVE mg/dL
Hgb urine dipstick: NEGATIVE
Ketones, ur: NEGATIVE mg/dL
Leukocytes,Ua: NEGATIVE
Nitrite: NEGATIVE
Protein, ur: NEGATIVE mg/dL
Specific Gravity, Urine: 1.021 (ref 1.005–1.030)
pH: 7 (ref 5.0–8.0)

## 2019-01-12 LAB — CBC
HCT: 43.1 % (ref 36.0–46.0)
Hemoglobin: 13.9 g/dL (ref 12.0–15.0)
MCH: 31.8 pg (ref 26.0–34.0)
MCHC: 32.3 g/dL (ref 30.0–36.0)
MCV: 98.6 fL (ref 80.0–100.0)
Platelets: 389 10*3/uL (ref 150–400)
RBC: 4.37 MIL/uL (ref 3.87–5.11)
RDW: 13.9 % (ref 11.5–15.5)
WBC: 11.5 10*3/uL — ABNORMAL HIGH (ref 4.0–10.5)
nRBC: 0 % (ref 0.0–0.2)

## 2019-01-12 LAB — COMPREHENSIVE METABOLIC PANEL
ALT: 39 U/L (ref 0–44)
AST: 27 U/L (ref 15–41)
Albumin: 3.5 g/dL (ref 3.5–5.0)
Alkaline Phosphatase: 59 U/L (ref 38–126)
Anion gap: 11 (ref 5–15)
BUN: 28 mg/dL — ABNORMAL HIGH (ref 8–23)
CO2: 24 mmol/L (ref 22–32)
Calcium: 8.8 mg/dL — ABNORMAL LOW (ref 8.9–10.3)
Chloride: 105 mmol/L (ref 98–111)
Creatinine, Ser: 1.49 mg/dL — ABNORMAL HIGH (ref 0.44–1.00)
GFR calc Af Amer: 41 mL/min — ABNORMAL LOW (ref >60)
GFR calc non Af Amer: 36 mL/min — ABNORMAL LOW (ref >60)
Glucose, Bld: 221 mg/dL — ABNORMAL HIGH (ref 70–99)
Potassium: 4.3 mmol/L (ref 3.5–5.1)
Sodium: 140 mmol/L (ref 135–145)
Total Bilirubin: 0.7 mg/dL (ref 0.3–1.2)
Total Protein: 7.8 g/dL (ref 6.5–8.1)

## 2019-01-12 LAB — LIPASE, BLOOD: Lipase: 32 U/L (ref 11–51)

## 2019-01-12 LAB — LACTIC ACID, PLASMA: Lactic Acid, Venous: 1.5 mmol/L (ref 0.5–1.9)

## 2019-01-12 MED ORDER — INSULIN ASPART 100 UNIT/ML ~~LOC~~ SOLN
0.0000 [IU] | SUBCUTANEOUS | Status: DC
  Administered 2019-01-13: 2 [IU] via SUBCUTANEOUS
  Administered 2019-01-13 (×3): 1 [IU] via SUBCUTANEOUS
  Administered 2019-01-13: 2 [IU] via SUBCUTANEOUS
  Filled 2019-01-12: qty 0.09

## 2019-01-12 MED ORDER — PROMETHAZINE HCL 25 MG/ML IJ SOLN
12.5000 mg | Freq: Four times a day (QID) | INTRAMUSCULAR | Status: DC | PRN
  Administered 2019-01-12 – 2019-01-13 (×2): 12.5 mg via INTRAVENOUS
  Filled 2019-01-12 (×3): qty 1

## 2019-01-12 MED ORDER — SODIUM CHLORIDE 0.9% FLUSH: 3.0000 mL | Freq: Once | INTRAVENOUS | Status: DC

## 2019-01-12 MED ORDER — IOHEXOL 300 MG/ML  SOLN
75.0000 mL | Freq: Once | INTRAMUSCULAR | Status: AC | PRN
  Administered 2019-01-12: 75 mL via INTRAVENOUS

## 2019-01-12 MED ORDER — SODIUM CHLORIDE 0.9 % IV BOLUS
1000.0000 mL | Freq: Once | INTRAVENOUS | Status: AC
  Administered 2019-01-12: 1000 mL via INTRAVENOUS

## 2019-01-12 MED ORDER — ONDANSETRON HCL 4 MG/2ML IJ SOLN
4.0000 mg | Freq: Once | INTRAMUSCULAR | Status: AC
  Administered 2019-01-12: 4 mg via INTRAVENOUS
  Filled 2019-01-12: qty 2

## 2019-01-12 MED ORDER — ACETAMINOPHEN 325 MG PO TABS: 650.0000 mg | ORAL_TABLET | Freq: Four times a day (QID) | ORAL | Status: DC | PRN

## 2019-01-12 MED ORDER — ACETAMINOPHEN 650 MG RE SUPP: 650.0000 mg | Freq: Four times a day (QID) | RECTAL | Status: DC | PRN

## 2019-01-12 MED ORDER — MORPHINE SULFATE (PF) 4 MG/ML IV SOLN
4.0000 mg | Freq: Once | INTRAVENOUS | Status: AC
  Administered 2019-01-12: 4 mg via INTRAVENOUS
  Filled 2019-01-12: qty 1

## 2019-01-12 MED ORDER — ONDANSETRON HCL 4 MG/2ML IJ SOLN
4.0000 mg | Freq: Four times a day (QID) | INTRAMUSCULAR | Status: DC | PRN
  Administered 2019-01-13: 4 mg via INTRAVENOUS
  Filled 2019-01-12: qty 2

## 2019-01-12 MED ORDER — ONDANSETRON HCL 4 MG PO TABS: 4.0000 mg | ORAL_TABLET | Freq: Four times a day (QID) | ORAL | Status: DC | PRN

## 2019-01-12 MED ORDER — SODIUM CHLORIDE 0.9 % IV SOLN
INTRAVENOUS | Status: AC
  Administered 2019-01-13 (×2): via INTRAVENOUS

## 2019-01-12 MED ORDER — MORPHINE SULFATE (PF) 2 MG/ML IV SOLN: 1.0000 mg | INTRAVENOUS | Status: DC | PRN

## 2019-01-12 NOTE — Consult Note (Signed)
Re:   Christina [name] DOB:   Aug 24, 1950 MRN:   [id number]  Chief Complaint Abdominal pain  ASSESEMENT AND PLAN: 1.  Thickened, edematous loops of small bowel - gastroenteritis ?  She does not have a very impressive PE  Plan:  Admission, keep NPO, repeat labs and exam in AM  2.  Ventral hernia 3.  Left adrenal nodule 4.  Atherosclerosis 5.  DM  Glucose - 221 6.  HTN 7.  Morbid obesity - BMI 42  Chief Complaint  Patient presents with  . Abdominal Pain  . Nausea  . Emesis  . Diarrhea   PHYSICIAN REQUESTING CONSULTATION: Dr. Demaris Callander, [facility]  HISTORY OF PRESENT ILLNESS: Christina [name] is a 68 y.o. (DOB: 1950-10-25)  AA female whose primary care physician is [name], MD and comes to the [facility] with vomiting, abdominal pain, and diarrhea of 24 hours.   The patient had colon surgery for cancer at [facility] in 2006.  She said this was done by Dr. Quentin Cornwall.  She sees Dr. Erlene Quan in [place] from a gastrointestinal standpoint.  She said the surgery controlled her colon cancer and she needed no further evaluation.  She has had no abdominal symptoms, pain, and was unaware she had a ventral hernia.  She had some fish on Wednesday, September 9, in which she has some lip swelling.  Then today she developed significant nausea and vomiting.  She has had some vague abdominal pain.  And she has had loose diarrheal stools x 6.  WBC - 01/12/2019 - 11,500 Lactic Acid - 01/12/2019 - 1.5 CT scan of the abdomen - 01/12/2019 - 1. Focally clustered loops of thickened small bowel in the mid abdomen with adjacent hazy stranding within partially twisted mesentery. Findings are raise concern for possible small bowel ischemia, possibly the result of adhesion or internal hernia. No high-grade mechanical obstruction or ileus is seen.  2. Large fat containing ventral hernia with a 9.7 by 4.4 cm fascial defect. Finding is in the region of postsurgical scarring.    Additional subcutaneous fluid  collections adjacent this incision site could reflect postoperative seroma or hematoma.  (note: part of what he is measuring may be more a diastasis)  3. 11 mm hypoattenuating nodule in the left adrenal gland, indeterminate on this single phase exam. Consider further evaluation with adrenal protocol CT or MRI.  4. Aortic Atherosclerosis (ICD10-I70.0).  Past Medical History:  Diagnosis Date  . Arthritis    OA RIGHT KNEE AND PAIN  . Cancer ([place]) 2011   HX OF COLON CANCER; S/P SURGERY AND DID NOT HAVE TO HAVE CHEMO OR RADIATION  . Diabetes mellitus without complication ([place])   . Hypertension       Past Surgical History:  Procedure Laterality Date  . BREAST SURGERY     RIGHT AND LEFT BREAST TUMORS REMOVED - BENIGN  . C-SECTIONS X 2    . COLON SURGERY    . FATTY TUMOR REMOVED FROM RIGHT HEEL    . TONSILLECTOMY     AT AGE OF 12  . TOTAL KNEE ARTHROPLASTY Right 08/02/2013   Procedure: RIGHT TOTAL KNEE ARTHROPLASTY;  Surgeon: Tobi Bastos, MD;  Location: [facility] OR[facility];  Service: Orthopedics;  Laterality: Right;      Current Facility-Administered Medications  Medication Dose Route Frequency Provider Last Rate Last Dose  . morphine 4 MG/ML injection 4 mg  4 mg Intravenous Once Valarie Merino, MD   Stopped at 01/12/19 2145  . sodium chloride  flush (NS) 0.9 % injection 3 mL  3 mL Intravenous Once Valarie Merino, MD       Current Outpatient Medications  Medication Sig Dispense Refill  . amLODipine-benazepril (LOTREL) 10-40 MG capsule Take 1 capsule by mouth daily.    . chlorthalidone (HYGROTON) 25 MG tablet Take 25 mg by mouth daily.    . metFORMIN (GLUMETZA) 500 MG (MOD) 24 hr tablet Take 1,000 mg by mouth daily with breakfast.    . methocarbamol (ROBAXIN) 500 MG tablet Take 1 tablet (500 mg total) by mouth every 6 (six) hours as needed for muscle spasms. (Patient not taking: Reported on 01/12/2019) 40 tablet 1  . oxyCODONE-acetaminophen (PERCOCET/ROXICET) 5-325 MG per tablet Take 1-2  tablets by mouth every 4 (four) hours as needed for severe pain. (Patient not taking: Reported on 01/12/2019) 360 tablet 0      Allergies  Allergen Reactions  . Atorvastatin     Other reaction(s): Cramps (ALLERGY/intolerance), Other  . Penicillins Dermatitis and Rash    Skin and nails peel   . Rosuvastatin     Other reaction(s): Cramps (ALLERGY/intolerance)  . Rosuvastatin Calcium     REVIEW OF SYSTEMS: Skin:  No history of rash.  No history of abnormal moles. Infection:  No history of hepatitis or HIV.  No history of MRSA. Neurologic:  No history of stroke.  No history of seizure.  No history of headaches. Cardiac:  No history of hypertension. No history of heart disease.  No history of prior cardiac catheterization.  No history of seeing a cardiologist. Pulmonary:  Does not smoke cigarettes.  No asthma or bronchitis.  No OSA/CPAP.  Endocrine:  DM x 10 years.  She does not check her BS. Gastrointestinal:  See HPI Urologic:  No history of kidney stones.  No history of bladder infections. Musculoskeletal:  Right knee arthroplasty - 08/02/2013 - [facility] Hematologic:  No bleeding disorder.  No history of anemia.  Not anticoagulated. Psycho-social:  The patient is oriented.   The patient has no obvious psychologic or social impairment to understanding our conversation and plan.  SOCIAL and FAMILY HISTORY: Married. She has 3 children - 19 yo daughter in [place], 79 yo son in [place], and 33 yo daughter who lives with her  PHYSICAL EXAM: BP (!) 159/84 (BP Location: Right Arm)   Pulse 86   Temp 99.1 F (37.3 C) (Oral)   Resp (!) 22   Ht 5\' 2"  (1.575 m)   Wt 104.3 kg   SpO2 95%   BMI 42.07 kg/m   General: Obese AA F who is alert. Skin:  Inspection and palpation - no mass or rash. Eyes:  Conjunctiva and lids unremarkable.            Pupils are equal Ears, Nose, Mouth, and Throat:  Ears and nose unremarkable            Lips and teeth are unremarable. Neck: Supple. No mass,  trachea midline.  No thyroid mass. Lymph Nodes:  No supraclavicular, cervical, or inguinal nodes. Lungs: Normal respiratory effort.  Clear to auscultation and symmetric breath sounds. Heart:  Palpation of the heart is normal.            Auscultation: RRR. No murmur or rub.  Abdomen: Very obese.  Soft. No mass. No localized tenderness.             Decreased bowel sounds.  Lower midline scar. Rectal: Not done. Musculoskeletal:  Good muscle strength and ROM  in upper  and lower extremities.  Neurologic:  Grossly intact to motor and sensory function. Psychiatric: Normal judgement and insight. Behavior is normal.            Oriented to time, person, place.   DATA REVIEWED, COUNSELING AND COORDINATION OF CARE: Epic notes reviewed. Counseling and coordination of care exceeded more than 50% of the time spent with patient. Total time spent with patient and charting: 50 minutes  Alphonsa Overall, MD,  [facility], [address]  [place], [place]    [place] Phone:  [phone] FAX:  [phone]

## 2019-01-12 NOTE — H&P (Addendum)
History and Physical    Christina [name] [id number] DOB: 1951-02-18 DOA: 01/12/2019  PCP: [name], MD   Patient coming from: Home   Chief Complaint: Abdominal pain, N/V/D   HPI: Christina [name] is a 68 y.o. female with medical history significant for colon cancer status post resection in 2006, type 2 diabetes mellitus, hypertension, and obesity (BMI 42), now presenting to the emergency department for evaluation of abdominal pain, nausea, vomiting, and diarrhea.  Patient reported that her symptoms began earlier today and include nausea with nonbloody vomiting and approximately 6 episodes of diarrhea.  When symptoms began, she was experiencing discomfort across the lower abdomen, but in the ED, she reports that the discomfort is now mainly in her upper abdomen.  She felt as though she might be having a fever earlier today.  She denies any sick contacts, travel, or recent antibiotics.  She denies melena or hematochezia. She has had 2 episodes of both vomiting and diarrhea in ED.   ED Course: Upon arrival to the ED, patient is found to be afebrile, saturating mid 90s on room air, and with stable blood pressure.  EKG features a sinus rhythm.  Chemistry panel is notable for creatinine 1.49, up from 1.12 in January 2020.  CBC features a leukocytosis to 11,500.  Lactic acid is reassuringly normal.  COVID-19 is negative.  CT the abdomen and pelvis is concerning for focally clustered loops of thickened small bowel with hazy stranding and partially twisted mesentery concerning for possible small bowel ischemia.  Also noted on the CT is ventral hernia with possible adjacent fluid collection, and left adrenal nodule.  Patient was given a liter of normal saline, morphine, and Zofran in the emergency department.  Surgery was consulted by the ED physician and recommended medical admission.  Review of Systems:  All other systems reviewed and apart from HPI, are negative.  Past Medical History:  Diagnosis  Date   Arthritis    OA RIGHT KNEE AND PAIN   Cancer ([place]) 2011   HX OF COLON CANCER; S/P SURGERY AND DID NOT HAVE TO HAVE CHEMO OR RADIATION   Diabetes mellitus without complication (HCC)    Hypertension     Past Surgical History:  Procedure Laterality Date   BREAST SURGERY     RIGHT AND LEFT BREAST TUMORS REMOVED - BENIGN   C-SECTIONS X 2     COLON SURGERY     FATTY TUMOR REMOVED FROM RIGHT HEEL     TONSILLECTOMY     AT AGE OF 12   TOTAL KNEE ARTHROPLASTY Right 08/02/2013   Procedure: RIGHT TOTAL KNEE ARTHROPLASTY;  Surgeon: Tobi Bastos, MD;  Location: [facility];  Service: Orthopedics;  Laterality: Right;     reports that she has never smoked. She has never used smokeless tobacco. She reports that she does not drink alcohol or use drugs.  Allergies  Allergen Reactions   Atorvastatin     Other reaction(s): Cramps (ALLERGY/intolerance), Other   Penicillins Dermatitis and Rash    Skin and nails peel    Rosuvastatin     Other reaction(s): Cramps (ALLERGY/intolerance)   Rosuvastatin Calcium     Family History  Problem Relation Age of Onset   Hypertension Mother    Heart attack Mother      Prior to Admission medications   Medication Sig Start Date End Date Taking? Authorizing Provider  amLODipine-benazepril (LOTREL) 10-40 MG capsule Take 1 capsule by mouth daily. 12/26/18  Yes [provider]  chlorthalidone (HYGROTON)  25 MG tablet Take 25 mg by mouth daily. 12/23/18  Yes [provider]  metFORMIN (GLUMETZA) 500 MG (MOD) 24 hr tablet Take 1,000 mg by mouth daily with breakfast.   Yes [provider]    Physical Exam: Vitals:   01/12/19 1931 01/12/19 2045 01/12/19 2047 01/12/19 2200  BP: (!) 165/88 (!) 159/84 (!) 159/84 130/60  Pulse: 76 81 86 72  Resp: (!) 22 (!) 21 (!) 22 (!) 27  Temp:   99.1 F (37.3 C)   TempSrc:   Oral   SpO2: 97% 99% 95% 94%  Weight:      Height:        Constitutional: NAD, appears uncomfortable    Eyes: PERTLA, lids and conjunctivae normal ENMT: Mucous membranes are moist. Posterior pharynx clear of any exudate or lesions.   Neck: normal, supple, no masses, no thyromegaly Respiratory: clear to auscultation bilaterally, no wheezing, no crackles. No accessory muscle use.  Cardiovascular: S1 & S2 heard, regular rate and rhythm. Pretibial pitting edema bilaterally. Abdomen: No distension, soft, tender without rebound pain or guarding. Bowel sounds active.  Musculoskeletal: no clubbing / cyanosis. No joint deformity upper and lower extremities.   Skin: no significant rashes, lesions, ulcers. Warm, dry, well-perfused. Neurologic: No gross facial asymmetry. Sensation intact. Moving all extremities.  Psychiatric: alert and oriented x 3. Pleasant, cooperative.    Labs on Admission: I have personally reviewed following labs and imaging studies  CBC: Recent Labs  Lab 01/12/19 1631  WBC 11.5*  HGB 13.9  HCT 43.1  MCV 98.6  PLT [id number]   Basic Metabolic Panel: Recent Labs  Lab 01/12/19 1631  NA 140  K 4.3  CL 105  CO2 24  GLUCOSE 221*  BUN 28*  CREATININE 1.49*  CALCIUM 8.8*   GFR: Estimated Creatinine Clearance: 41 mL/min (A) (by C-G formula based on SCr of 1.49 mg/dL (H)). Liver Function Tests: Recent Labs  Lab 01/12/19 1631  AST 27  ALT 39  ALKPHOS 59  BILITOT 0.7  PROT 7.8  ALBUMIN 3.5   Recent Labs  Lab 01/12/19 1631  LIPASE 32   No results for input(s): AMMONIA in the last 168 hours. Coagulation Profile: No results for input(s): INR, PROTIME in the last 168 hours. Cardiac Enzymes: No results for input(s): CKTOTAL, CKMB, CKMBINDEX, TROPONINI in the last 168 hours. BNP (last 3 results) No results for input(s): PROBNP in the last 8760 hours. HbA1C: No results for input(s): HGBA1C in the last 72 hours. CBG: No results for input(s): GLUCAP in the last 168 hours. Lipid Profile: No results for input(s): CHOL, HDL, LDLCALC, TRIG, CHOLHDL, LDLDIRECT in the last  72 hours. Thyroid Function Tests: No results for input(s): TSH, T4TOTAL, FREET4, T3FREE, THYROIDAB in the last 72 hours. Anemia Panel: No results for input(s): VITAMINB12, FOLATE, FERRITIN, TIBC, IRON, RETICCTPCT in the last 72 hours. Urine analysis:    Component Value Date/Time   COLORURINE YELLOW 01/12/2019 2000   APPEARANCEUR CLEAR 01/12/2019 2000   LABSPEC 1.021 01/12/2019 2000   PHURINE 7.0 01/12/2019 2000   GLUCOSEU NEGATIVE 01/12/2019 2000   HGBUR NEGATIVE 01/12/2019 2000   BILIRUBINUR NEGATIVE 01/12/2019 2000   KETONESUR NEGATIVE 01/12/2019 2000   PROTEINUR NEGATIVE 01/12/2019 2000   UROBILINOGEN 0.2 07/24/2013 1424   NITRITE NEGATIVE 01/12/2019 2000   LEUKOCYTESUR NEGATIVE 01/12/2019 2000   Sepsis Labs: @LABRCNTIP (procalcitonin:4,lacticidven:4) ) Recent Results (from the past 240 hour(s))  SARS Coronavirus 2 [facility] order, Performed in [facility] hospital lab) Nasopharyngeal Nasopharyngeal Swab  Status: None   Collection Time: 01/12/19  6:34 PM   Specimen: Nasopharyngeal Swab  Result Value Ref Range Status   SARS Coronavirus 2 NEGATIVE NEGATIVE Final    Comment: (NOTE) If result is NEGATIVE SARS-CoV-2 target nucleic acids are NOT DETECTED. The SARS-CoV-2 RNA is generally detectable in upper and lower  respiratory specimens during the acute phase of infection. The lowest  concentration of SARS-CoV-2 viral copies this assay can detect is 250  copies / mL. A negative result does not preclude SARS-CoV-2 infection  and should not be used as the sole basis for treatment or other  patient management decisions.  A negative result may occur with  improper specimen collection / handling, submission of specimen other  than nasopharyngeal swab, presence of viral mutation(s) within the  areas targeted by this assay, and inadequate number of viral copies  (<250 copies / mL). A negative result must be combined with clinical  observations, patient history, and  epidemiological information. If result is POSITIVE SARS-CoV-2 target nucleic acids are DETECTED. The SARS-CoV-2 RNA is generally detectable in upper and lower  respiratory specimens dur ing the acute phase of infection.  Positive  results are indicative of active infection with SARS-CoV-2.  Clinical  correlation with patient history and other diagnostic information is  necessary to determine patient infection status.  Positive results do  not rule out bacterial infection or co-infection with other viruses. If result is PRESUMPTIVE POSTIVE SARS-CoV-2 nucleic acids MAY BE PRESENT.   A presumptive positive result was obtained on the submitted specimen  and confirmed on repeat testing.  While 2019 novel coronavirus  (SARS-CoV-2) nucleic acids may be present in the submitted sample  additional confirmatory testing may be necessary for epidemiological  and / or clinical management purposes  to differentiate between  SARS-CoV-2 and other Sarbecovirus currently known to infect humans.  If clinically indicated additional testing with an alternate test  methodology [phone]) is advised. The SARS-CoV-2 RNA is generally  detectable in upper and lower respiratory sp ecimens during the acute  phase of infection. The expected result is Negative. Fact Sheet for Patients:  StrictlyIdeas.no Fact Sheet for Healthcare Providers: BankingDealers.co.za This test is not yet approved or cleared by the [place] FDA and has been authorized for detection and/or diagnosis of SARS-CoV-2 by FDA under an Emergency Use Authorization (EUA).  This EUA will remain in effect (meaning this test can be used) for the duration of the COVID-19 declaration under Section 564(b)(1) of the Act, 21 U.S.C. section 360bbb-3(b)(1), unless the authorization is terminated or revoked sooner. Performed at [facility], [place] [address] [place], [place] [address]       Radiological Exams on Admission: Ct Abdomen Pelvis W Contrast  Result Date: 01/12/2019 CLINICAL DATA:  Abdominal pain, distension EXAM: CT ABDOMEN AND PELVIS WITH CONTRAST TECHNIQUE: Multidetector CT imaging of the abdomen and pelvis was performed using the standard protocol following bolus administration of intravenous contrast. CONTRAST:  33mL OMNIPAQUE IOHEXOL 300 MG/ML  SOLN COMPARISON:  None. FINDINGS: Lower chest: Basilar areas of atelectasis and/or scarring. Normal heart size. No pericardial effusion. Hepatobiliary: No focal liver abnormality is seen. No gallstones, gallbladder wall thickening, or biliary dilatation. Rounded calcifications along the dome of the liver, do not clearly appear to reside within the hepatic parenchyma, possibly reflecting peritoneal loose body. No gallbladder wall thickening or calcified gallstones. No biliary ductal dilatation. Pancreas: Unremarkable. No pancreatic ductal dilatation or surrounding inflammatory changes. Spleen: Normal in size without focal abnormality. Adrenals/Urinary  Tract: 11 mm hypoattenuating nodule in the left adrenal gland, indeterminate on this single phase exam. Right adrenal gland is unremarkable. Fluid attenuation 3.7 cm cystic structure arising from the upper pole right kidney. 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 Mild bilateral symmetric perinephric stranding, a nonspecific finding though may correlate with either age or decreased renal function. Kidneys are otherwise unremarkable, without renal calculi, suspicious lesion, or hydronephrosis. Bladder is unremarkable. Stomach/Bowel: Small hiatal hernia. Stomach and duodenum are unremarkable. There are focally clustered loops of thickened small bowel in the mid abdomen with adjacent hazy stranding within partially twisted mesentery. No dilatation or convincing evidence of obstruction though abrupt transition points between the thickened  and non thickened bowel are visualized (coronal 5/44, 5/34). Distal small bowel is more unremarkable in appearance. There is prior right hemicolectomy with ileocolic anastomosis. Scattered colonic diverticular present throughout the remaining portion of the colon. No focal pericolonic inflammation to suggest diverticulitis. Vascular/Lymphatic: Atherosclerotic plaque within the normal caliber aorta. No proximal occlusions are visualized. No suspicious or enlarged lymph nodes in the included lymphatic chains. Reproductive: Anteverted uterus. No concerning adnexal lesions. Small amount of fluid in the left adnexa, likely reactive. Other: Reactive free fluid is seen throughout the abdomen, within the paracolic gutters, layering in the pelvis, and in the subhepatic space. Lobular centrally fat attenuation lesion along the right paracolic gutter with peripheral calcification is compatible with an area of postsurgical fat necrosis or a torsed epiploic appendage. Large fat containing ventral hernia with a 9.7 by 4.4 cm fascial defect. Finding is in the region of postsurgical scarring, likely incisional hernia. Adjacent to this is a high view lower region of fluid attenuation in the subcutaneous fat, possibly postsurgical in nature such as related to prior ostomy take-down. Musculoskeletal: Multilevel degenerative changes are present in the imaged portions of the spine. IMPRESSION: 1. Focally clustered loops of thickened small bowel in the mid abdomen with adjacent hazy stranding within partially twisted mesentery. Findings are raise concern for possible small bowel ischemia, possibly the result of adhesion or internal hernia. No high-grade mechanical obstruction or ileus is seen. 2. Large fat containing ventral hernia with a 9.7 by 4.4 cm fascial defect. Finding is in the region of postsurgical scarring. Additional subcutaneous fluid collections adjacent this incision site could reflect postoperative seroma or hematoma. 3.  11 mm hypoattenuating nodule in the left adrenal gland, indeterminate on this single phase exam. Consider further evaluation with adrenal protocol CT or MRI. 4. Aortic Atherosclerosis (ICD10-I70.0). These results were called by telephone at the time of interpretation on 01/12/2019 at 7:39 pm to provider [place] , who verbally acknowledged these results. Electronically Signed   By: Lovena Le M.D.   On: 01/12/2019 19:40    EKG: Independently reviewed. Sinus rhythm, rate 72, QTc 442.   Assessment/Plan   1. Abdominal pain with N/V/D  - Presents with abdominal pain and N/V/D that began earlier today  - She is afebrile and hemodynamically stable with mild leukocytosis, reasurringly normal lactate, and CT findings of clustered and thickened small bowel loops that could reflect ischemia  - Surgery is consulting and much appreciated  - She was treated in ED with IVF, analgesia, and antiemetics  - Continue bowel-rest, IVF hydration, supportive care, serial exams, monitor electrolytes    2. Renal insufficiency  - SCr is 1.49 on admission, up from 1.12 in January 2020  - Continue IVF hydration while NPO, ACE-i held, renally-dose medications, repeat chem  panel in am    3. Type II DM  - No recent A1c on file - Managed with metformin at home, held on admission  - Check CBG's and use a SSI with Novolog while in hospital   4. Hypertension  - BP at goal  - Antihypertensives held on admission, will treat prn for now    5. Left adrenal nodule  - Noted incidentally on CT in ED   - Discussed with patient  - Non-emergent follow-up imaging recommended     PPE: Mask, face shield  DVT prophylaxis: SCD's  Code Status: Full  Family Communication: Discussed with patient  Consults called: Surgery  Admission status: Observation    Vianne Bulls, MD [facility] Pager [phone]  If 7PM-7AM, please contact night-coverage [email or web address] Password [facility]  01/12/2019, 10:34 PM

## 2019-01-12 NOTE — ED Notes (Signed)
Spoke with Dr. Francia Greaves, the 2nd Lactic Acid does not need to be redrawn.

## 2019-01-12 NOTE — ED Notes (Signed)
Gave report to Lavonna Monarch, RN for room [phone].

## 2019-01-12 NOTE — ED Provider Notes (Signed)
[facility] DEPT Provider Note   CSN: [id number] Arrival date & time: 01/12/19  1439     History   Chief Complaint Chief Complaint  Patient presents with  . Abdominal Pain  . Nausea  . Emesis  . Diarrhea    HPI Christina [name] is a 68 y.o. female.     68 year old female with prior medical history as detailed below presents for evaluation of nausea, vomiting, diarrhea, abdominal cramps.  Symptoms began last night and worsened over the morning.  She denies associated fever.  She denies prior similar complaints.  She denies chest pain or shortness of breath.  She has not taken anything for symptoms at home.    The history is provided by the patient and medical records.  Abdominal Pain Pain location:  Suprapubic Pain quality: aching and cramping   Pain radiates to:  Does not radiate Pain severity:  Mild Onset quality:  Gradual Duration:  1 day Timing:  Constant Progression:  Waxing and waning Chronicity:  New Relieved by:  Nothing Worsened by:  Nothing Associated symptoms: diarrhea and vomiting   Emesis Associated symptoms: abdominal pain and diarrhea   Diarrhea Associated symptoms: abdominal pain and vomiting     Past Medical History:  Diagnosis Date  . Arthritis    OA RIGHT KNEE AND PAIN  . Cancer ([place]) 2011   HX OF COLON CANCER; S/P SURGERY AND DID NOT HAVE TO HAVE CHEMO OR RADIATION  . Diabetes mellitus without complication ([place])   . Hypertension     Patient Active Problem List   Diagnosis Date Noted  . Constipation 09/07/2013  . Surgical wound infection 08/08/2013  . Diabetes mellitus without complication ([place])   . Hypertension   . Osteoarthritis of right knee 08/02/2013  . Total knee replacement status 08/02/2013    Past Surgical History:  Procedure Laterality Date  . BREAST SURGERY     RIGHT AND LEFT BREAST TUMORS REMOVED - BENIGN  . C-SECTIONS X 2    . COLON SURGERY    . FATTY TUMOR REMOVED FROM RIGHT HEEL    .  TONSILLECTOMY     AT AGE OF 12  . TOTAL KNEE ARTHROPLASTY Right 08/02/2013   Procedure: RIGHT TOTAL KNEE ARTHROPLASTY;  Surgeon: Tobi Bastos, MD;  Location: [facility];  Service: Orthopedics;  Laterality: Right;     OB History   No obstetric history on file.      Home Medications    Prior to Admission medications   Medication Sig Start Date End Date Taking? Authorizing Provider  amLODipine-benazepril (LOTREL) 10-40 MG capsule Take 1 capsule by mouth daily. 12/26/18  Yes [provider]  chlorthalidone (HYGROTON) 25 MG tablet Take 25 mg by mouth daily. 12/23/18  Yes [provider]  metFORMIN (GLUMETZA) 500 MG (MOD) 24 hr tablet Take 1,000 mg by mouth daily with breakfast.   Yes [provider]  methocarbamol (ROBAXIN) 500 MG tablet Take 1 tablet (500 mg total) by mouth every 6 (six) hours as needed for muscle spasms. Patient not taking: Reported on 01/12/2019 08/02/13   Ardeen Jourdain, PA-C  oxyCODONE-acetaminophen (PERCOCET/ROXICET) 5-325 MG per tablet Take 1-2 tablets by mouth every 4 (four) hours as needed for severe pain. Patient not taking: Reported on 01/12/2019 08/14/13   Gayland Curry, DO    Family History Family History  Problem Relation Age of Onset  . Hypertension Mother   . Heart attack Mother     Social History Social History  Tobacco Use  . Smoking status: Never Smoker  . Smokeless tobacco: Never Used  Substance Use Topics  . Alcohol use: No  . Drug use: No     Allergies   Atorvastatin, Penicillins, Rosuvastatin, and Rosuvastatin calcium   Review of Systems Review of Systems  Gastrointestinal: Positive for abdominal pain, diarrhea and vomiting.  All other systems reviewed and are negative.    Physical Exam Updated Vital Signs BP (!) 142/73   Pulse 70   Temp 98 F (36.7 C)   Resp 18   Ht 5\' 2"  (1.575 m)   Wt 104.3 kg   SpO2 94%   BMI 42.07 kg/m   Physical Exam Vitals signs and nursing note reviewed.   Constitutional:      General: She is not in acute distress.    Appearance: She is well-developed.  HENT:     Head: Normocephalic and atraumatic.  Eyes:     Conjunctiva/sclera: Conjunctivae normal.     Pupils: Pupils are equal, round, and reactive to light.  Neck:     Musculoskeletal: Normal range of motion and neck supple.  Cardiovascular:     Rate and Rhythm: Normal rate and regular rhythm.     Heart sounds: Normal heart sounds.  Pulmonary:     Effort: Pulmonary effort is normal. No respiratory distress.     Breath sounds: Normal breath sounds.  Abdominal:     General: There is no distension.     Palpations: Abdomen is soft.     Tenderness: There is generalized abdominal tenderness.  Musculoskeletal: Normal range of motion.        General: No deformity.  Skin:    General: Skin is warm and dry.  Neurological:     Mental Status: She is alert and oriented to person, place, and time.      ED Treatments / Results  Labs (all labs ordered are listed, but only abnormal results are displayed) Labs Reviewed  COMPREHENSIVE METABOLIC PANEL - Abnormal; Notable for the following components:      Result Value   Glucose, Bld 221 (*)    BUN 28 (*)    Creatinine, Ser 1.49 (*)    Calcium 8.8 (*)    GFR calc non Af Amer 36 (*)    GFR calc Af Amer 41 (*)    All other components within normal limits  CBC - Abnormal; Notable for the following components:   WBC 11.5 (*)    All other components within normal limits  SARS CORONAVIRUS 2 (HOSPITAL ORDER, [facility])  LIPASE, BLOOD  URINALYSIS, ROUTINE W REFLEX MICROSCOPIC  LACTIC ACID, PLASMA    EKG EKG Interpretation  Date/Time:  Thursday January 12 2019 16:05:26 EDT Ventricular Rate:  73 PR Interval:    QRS Duration: 81 QT Interval:  401 QTC Calculation: 442 R Axis:   -19 Text Interpretation:  Sinus rhythm Borderline left axis deviation Low voltage, precordial leads Borderline T abnormalities,  inferior leads Confirmed by Dene Gentry [phone]) on 01/12/2019 4:58:22 PM   Radiology No results found.  Procedures Procedures (including critical care time)  Medications Ordered in ED Medications  sodium chloride flush (NS) 0.9 % injection 3 mL ( Intravenous Canceled Entry 01/12/19 2046)  morphine 4 MG/ML injection 4 mg (0 mg Intravenous Hold 01/12/19 2145)  ondansetron (ZOFRAN) injection 4 mg (4 mg Intravenous Given 01/12/19 1715)  sodium chloride 0.9 % bolus 1,000 mL (0 mLs Intravenous Stopped 01/12/19 1833)  iohexol (OMNIPAQUE) 300 MG/ML  solution 75 mL (75 mLs Intravenous Contrast Given 01/12/19 1853)  ondansetron (ZOFRAN) injection 4 mg (4 mg Intravenous Given 01/12/19 2044)     Initial Impression / Assessment and Plan / ED Course  I have reviewed the triage vital signs and the nursing notes.  Pertinent labs & imaging results that were available during my care of the patient were reviewed by me and considered in my medical decision making (see chart for details).        MDM  Screen complete  Thelda Stokke was evaluated in Emergency Department on 01/12/2019 for the symptoms described in the history of present illness. She was evaluated in the context of the global COVID-19 pandemic, which necessitated consideration that the patient might be at risk for infection with the SARS-CoV-2 virus that causes COVID-19. Institutional protocols and algorithms that pertain to the evaluation of patients at risk for COVID-19 are in a state of rapid change based on information released by regulatory bodies including the CDC and federal and state organizations. These policies and algorithms were followed during the patient's care in the ED.  Patient is presenting for evaluation abdominal pain.  CT imaging is concerning for possible mesenteric ischemia.  Dr. Lucia Gaskins of surgery is aware of case and will evaluate the patient.  He is requesting hospitalist do the admission.  Dr. Myna Hidalgo of hospitalist  service is aware of case and will evaluate the patient for admission.   Final Clinical Impressions(s) / ED Diagnoses   Final diagnoses:  Generalized abdominal pain    ED Discharge Orders    None       Valarie Merino, MD 01/12/19 2227

## 2019-01-12 NOTE — ED Notes (Signed)
Dr. Lucia Gaskins has just walked into the patients room.

## 2019-01-12 NOTE — ED Notes (Signed)
Unable to collect urine specimen d/t bed pan leak. Advised pt to notify when able to urinate again. [employer]

## 2019-01-12 NOTE — ED Notes (Signed)
Patient vomited and had an episode of diarrhea. Performed patient hygiene. Changed her gown, linen, and placed clean in continence pad under patient. Also, placed a new external catheter in place.

## 2019-01-12 NOTE — ED Triage Notes (Signed)
Pt arrived via EMS from home. Pt lives with family. Pt c/o lower abdominal pain cramping with associated n/v/d, that started 11 am today. Pt denies any ill feelings yesterday.   8 mg Zofran pt reported relief.  22 G left forearm   EMS v/s 138/70, HR 72, RR 16, CBG 288

## 2019-01-12 NOTE — ED Notes (Signed)
Went to bedside to administer Morphine, patient had vomited another small amount, and had a small amount of diarrhea. Performed patient hygiene. Changed patient's gown and incontinence pad. Place a new external catheter on patient. Christina [name], NT assisted also. Going to hold Morphine to make sure her vomiting will stop or at least her nausea will subside.

## 2019-01-13 ENCOUNTER — Other Ambulatory Visit: Payer: Self-pay

## 2019-01-13 DIAGNOSIS — E860 Dehydration: Secondary | ICD-10-CM | POA: Diagnosis present

## 2019-01-13 DIAGNOSIS — R1084 Generalized abdominal pain: Secondary | ICD-10-CM | POA: Diagnosis present

## 2019-01-13 DIAGNOSIS — Z880 Allergy status to penicillin: Secondary | ICD-10-CM | POA: Diagnosis not present

## 2019-01-13 DIAGNOSIS — R112 Nausea with vomiting, unspecified: Secondary | ICD-10-CM | POA: Diagnosis not present

## 2019-01-13 DIAGNOSIS — K439 Ventral hernia without obstruction or gangrene: Secondary | ICD-10-CM | POA: Diagnosis not present

## 2019-01-13 DIAGNOSIS — Z96651 Presence of right artificial knee joint: Secondary | ICD-10-CM | POA: Diagnosis present

## 2019-01-13 DIAGNOSIS — K432 Incisional hernia without obstruction or gangrene: Secondary | ICD-10-CM | POA: Diagnosis present

## 2019-01-13 DIAGNOSIS — R197 Diarrhea, unspecified: Secondary | ICD-10-CM | POA: Diagnosis not present

## 2019-01-13 DIAGNOSIS — Z8249 Family history of ischemic heart disease and other diseases of the circulatory system: Secondary | ICD-10-CM | POA: Diagnosis not present

## 2019-01-13 DIAGNOSIS — Z79899 Other long term (current) drug therapy: Secondary | ICD-10-CM | POA: Diagnosis not present

## 2019-01-13 DIAGNOSIS — Z888 Allergy status to other drugs, medicaments and biological substances status: Secondary | ICD-10-CM | POA: Diagnosis not present

## 2019-01-13 DIAGNOSIS — R109 Unspecified abdominal pain: Secondary | ICD-10-CM | POA: Diagnosis not present

## 2019-01-13 DIAGNOSIS — R1110 Vomiting, unspecified: Secondary | ICD-10-CM | POA: Diagnosis not present

## 2019-01-13 DIAGNOSIS — Z6841 Body Mass Index (BMI) 40.0 and over, adult: Secondary | ICD-10-CM | POA: Diagnosis not present

## 2019-01-13 DIAGNOSIS — E119 Type 2 diabetes mellitus without complications: Secondary | ICD-10-CM | POA: Diagnosis present

## 2019-01-13 DIAGNOSIS — E278 Other specified disorders of adrenal gland: Secondary | ICD-10-CM | POA: Diagnosis present

## 2019-01-13 DIAGNOSIS — N179 Acute kidney failure, unspecified: Secondary | ICD-10-CM | POA: Diagnosis present

## 2019-01-13 DIAGNOSIS — I700 Atherosclerosis of aorta: Secondary | ICD-10-CM | POA: Diagnosis present

## 2019-01-13 DIAGNOSIS — Z20828 Contact with and (suspected) exposure to other viral communicable diseases: Secondary | ICD-10-CM | POA: Diagnosis present

## 2019-01-13 DIAGNOSIS — Z7984 Long term (current) use of oral hypoglycemic drugs: Secondary | ICD-10-CM | POA: Diagnosis not present

## 2019-01-13 DIAGNOSIS — K529 Noninfective gastroenteritis and colitis, unspecified: Secondary | ICD-10-CM | POA: Diagnosis present

## 2019-01-13 DIAGNOSIS — I10 Essential (primary) hypertension: Secondary | ICD-10-CM | POA: Diagnosis present

## 2019-01-13 DIAGNOSIS — Z85038 Personal history of other malignant neoplasm of large intestine: Secondary | ICD-10-CM | POA: Diagnosis not present

## 2019-01-13 LAB — CBC WITH DIFFERENTIAL/PLATELET
Abs Immature Granulocytes: 0.05 10*3/uL (ref 0.00–0.07)
Basophils Absolute: 0 10*3/uL (ref 0.0–0.1)
Basophils Relative: 0 %
Eosinophils Absolute: 0.1 10*3/uL (ref 0.0–0.5)
Eosinophils Relative: 1 %
HCT: 38.4 % (ref 36.0–46.0)
Hemoglobin: 12.2 g/dL (ref 12.0–15.0)
Immature Granulocytes: 1 %
Lymphocytes Relative: 17 %
Lymphs Abs: 1.4 10*3/uL (ref 0.7–4.0)
MCH: 31.9 pg (ref 26.0–34.0)
MCHC: 31.8 g/dL (ref 30.0–36.0)
MCV: 100.5 fL — ABNORMAL HIGH (ref 80.0–100.0)
Monocytes Absolute: 0.6 10*3/uL (ref 0.1–1.0)
Monocytes Relative: 7 %
Neutro Abs: 6.3 10*3/uL (ref 1.7–7.7)
Neutrophils Relative %: 74 %
Platelets: 337 10*3/uL (ref 150–400)
RBC: 3.82 MIL/uL — ABNORMAL LOW (ref 3.87–5.11)
RDW: 14.2 % (ref 11.5–15.5)
WBC: 8.4 10*3/uL (ref 4.0–10.5)
nRBC: 0 % (ref 0.0–0.2)

## 2019-01-13 LAB — COMPREHENSIVE METABOLIC PANEL
ALT: 29 U/L (ref 0–44)
AST: 19 U/L (ref 15–41)
Albumin: 2.9 g/dL — ABNORMAL LOW (ref 3.5–5.0)
Alkaline Phosphatase: 44 U/L (ref 38–126)
Anion gap: 10 (ref 5–15)
BUN: 21 mg/dL (ref 8–23)
CO2: 22 mmol/L (ref 22–32)
Calcium: 8.4 mg/dL — ABNORMAL LOW (ref 8.9–10.3)
Chloride: 110 mmol/L (ref 98–111)
Creatinine, Ser: 1.4 mg/dL — ABNORMAL HIGH (ref 0.44–1.00)
GFR calc Af Amer: 45 mL/min — ABNORMAL LOW (ref >60)
GFR calc non Af Amer: 39 mL/min — ABNORMAL LOW (ref >60)
Glucose, Bld: 156 mg/dL — ABNORMAL HIGH (ref 70–99)
Potassium: 4.4 mmol/L (ref 3.5–5.1)
Sodium: 142 mmol/L (ref 135–145)
Total Bilirubin: 0.7 mg/dL (ref 0.3–1.2)
Total Protein: 6.2 g/dL — ABNORMAL LOW (ref 6.5–8.1)

## 2019-01-13 LAB — GLUCOSE, CAPILLARY
Glucose-Capillary: 102 mg/dL — ABNORMAL HIGH (ref 70–99)
Glucose-Capillary: 114 mg/dL — ABNORMAL HIGH (ref 70–99)
Glucose-Capillary: 128 mg/dL — ABNORMAL HIGH (ref 70–99)
Glucose-Capillary: 128 mg/dL — ABNORMAL HIGH (ref 70–99)
Glucose-Capillary: 147 mg/dL — ABNORMAL HIGH (ref 70–99)
Glucose-Capillary: 156 mg/dL — ABNORMAL HIGH (ref 70–99)
Glucose-Capillary: 181 mg/dL — ABNORMAL HIGH (ref 70–99)

## 2019-01-13 LAB — HIV ANTIBODY (ROUTINE TESTING W REFLEX): HIV Screen 4th Generation wRfx: NONREACTIVE

## 2019-01-13 MED ORDER — SODIUM CHLORIDE 0.45 % IV SOLN
INTRAVENOUS | Status: DC
  Administered 2019-01-13 – 2019-01-14 (×2): via INTRAVENOUS

## 2019-01-13 MED ORDER — AMLODIPINE BESYLATE 10 MG PO TABS
10.0000 mg | ORAL_TABLET | Freq: Every day | ORAL | Status: DC
  Administered 2019-01-13 – 2019-01-14 (×2): 10 mg via ORAL
  Filled 2019-01-13 (×2): qty 1

## 2019-01-13 MED ORDER — ENOXAPARIN SODIUM 40 MG/0.4ML ~~LOC~~ SOLN
40.0000 mg | SUBCUTANEOUS | Status: DC
  Administered 2019-01-13: 40 mg via SUBCUTANEOUS
  Filled 2019-01-13: qty 0.4

## 2019-01-13 NOTE — Progress Notes (Signed)
PROGRESS NOTE    Christina [name]  [id number] DOB: [birth year] DOA: 01/12/2019 PCP: [name], MD    Brief Narrative:68 y.o. female with medical history significant for colon cancer status post resection in 2006, type 2 diabetes mellitus, hypertension, and obesity (BMI 42), now presenting to the emergency department for evaluation of abdominal pain, nausea, vomiting, and diarrhea.  Patient reported that her symptoms began earlier today and include nausea with nonbloody vomiting and approximately 6 episodes of diarrhea.  When symptoms began, she was experiencing discomfort across the lower abdomen, but in the ED, she reports that the discomfort is now mainly in her upper abdomen.  She felt as though she might be having a fever earlier today.  She denies any sick contacts, travel, or recent antibiotics.  She denies melena or hematochezia. She has had 2 episodes of both vomiting and diarrhea in ED.   ED Course: Upon arrival to the ED, patient is found to be afebrile, saturating mid 90s on room air, and with stable blood pressure.  EKG features a sinus rhythm.  Chemistry panel is notable for creatinine 1.49, up from 1.12 in January 2020.  CBC features a leukocytosis to 11,500.  Lactic acid is reassuringly normal.  COVID-19 is negative.  CT the abdomen and pelvis is concerning for focally clustered loops of thickened small bowel with hazy stranding and partially twisted mesentery concerning for possible small bowel ischemia.  Also noted on the CT is ventral hernia with possible adjacent fluid collection, and left adrenal nodule.  Patient was given a liter of normal saline, morphine, and Zofran in the emergency department.  Surgery was consulted by the ED physician and recommended medical admission.   Assessment & Plan:   Principal Problem:   Abdominal pain, vomiting, and diarrhea Active Problems:   Diabetes mellitus without complication (HCC)   Hypertension   Renal insufficiency   Adrenal  nodule (HCC)  #1 gastroenteritis-patient admitted with nausea vomiting diarrhea which is resolving has been npo.starting clear liquids.advance diet as tolerated.. She has been afebrile normal lactate level.  CT shows thickened small bowel loops?  Ischemia versus enteritis.  However she is improving with n.p.o. bowel rest IV fluids supportive care.  Appreciate surgery input.  Patient is not safe discharge at this time due to ongoing needs for IV fluids and n.p.o. status just being changed.  She needs ongoing hospital stay and IV fluids and IV antiemetics.  She has not gotten out of bed since admission to hospital for fear of pain.  Obtain PT consult.  #2 AKI improving with IV fluids.  Hold ACE inhibitor.  And HCTZ.  #3 type 2 diabetes continue to hold metformin monitor blood sugar continue SSI  #4 hypertension restart amlodipine continue to hold ACE inhibitor and HCTZ as patient is dehydrated.  #5 left adrenal nodule incidental finding on CT scan follow-up as outpatient   Estimated body mass index is 42.07 kg/m as calculated from the following:   Height as of this encounter: 5\' 2"  (1.575 m).   Weight as of this encounter: 104.3 kg.  DVT prophylaxis: Lovenox Code Status: Full code Family Communication none Disposition Plan:  Pending clinical improvement  Consultants: General surgery   Procedures: None Antimicrobials: None Subjective: Patient has been n.p.o. diarrhea nausea and vomiting is better than yesterday.   Objective: Vitals:   01/12/19 2330 01/13/19 0007 01/13/19 0416 01/13/19 0432  BP: (!) 96/53 (!) 106/55 (!) 108/56 110/62  Pulse: 78 69 64   Resp: 18 15 18  Temp:  97.8 F (36.6 C) 97.7 F (36.5 C)   TempSrc:  Oral Oral   SpO2: 93% 96% 94%   Weight:      Height:        Intake/Output Summary (Last 24 hours) at 01/13/2019 1309 Last data filed at 01/13/2019 1000 Gross per 24 hour  Intake 1913.85 ml  Output 200 ml  Net 1713.85 ml   Filed Weights   01/12/19 1516   Weight: 104.3 kg    Examination:  General exam: Appears calm and comfortable oral mucosa dry Respiratory system: Clear to auscultation. Respiratory effort normal. Cardiovascular system: S1 & S2 heard, RRR. No JVD, murmurs, rubs, gallops or clicks. No pedal edema. Gastrointestinal system: Abdomen is nondistended, soft and nontender. No organomegaly or masses felt. Normal bowel sounds heard. Central nervous system: Alert and oriented. No focal neurological deficits. Extremities: Symmetric 5 x 5 power. Skin: No rashes, lesions or ulcers Psychiatry: Judgement and insight appear normal. Mood & affect appropriate.     Data Reviewed: I have personally reviewed following labs and imaging studies  CBC: Recent Labs  Lab 01/12/19 1631 01/13/19 0318  WBC 11.5* 8.4  NEUTROABS  --  6.3  HGB 13.9 12.2  HCT 43.1 38.4  MCV 98.6 100.5*  PLT 389 [id number]   Basic Metabolic Panel: Recent Labs  Lab 01/12/19 1631 01/13/19 0318  NA 140 142  K 4.3 4.4  CL 105 110  CO2 24 22  GLUCOSE 221* 156*  BUN 28* 21  CREATININE 1.49* 1.40*  CALCIUM 8.8* 8.4*   GFR: Estimated Creatinine Clearance: 43.6 mL/min (A) (by C-G formula based on SCr of 1.4 mg/dL (H)). Liver Function Tests: Recent Labs  Lab 01/12/19 1631 01/13/19 0318  AST 27 19  ALT 39 29  ALKPHOS 59 44  BILITOT 0.7 0.7  PROT 7.8 6.2*  ALBUMIN 3.5 2.9*   Recent Labs  Lab 01/12/19 1631  LIPASE 32   No results for input(s): AMMONIA in the last 168 hours. Coagulation Profile: No results for input(s): INR, PROTIME in the last 168 hours. Cardiac Enzymes: No results for input(s): CKTOTAL, CKMB, CKMBINDEX, TROPONINI in the last 168 hours. BNP (last 3 results) No results for input(s): PROBNP in the last 8760 hours. HbA1C: No results for input(s): HGBA1C in the last 72 hours. CBG: Recent Labs  Lab 01/13/19 0033 01/13/19 0411 01/13/19 0737 01/13/19 1239  GLUCAP 181* 147* 114* 128*   Lipid Profile: No results for input(s):  CHOL, HDL, LDLCALC, TRIG, CHOLHDL, LDLDIRECT in the last 72 hours. Thyroid Function Tests: No results for input(s): TSH, T4TOTAL, FREET4, T3FREE, THYROIDAB in the last 72 hours. Anemia Panel: No results for input(s): VITAMINB12, FOLATE, FERRITIN, TIBC, IRON, RETICCTPCT in the last 72 hours. Sepsis Labs: Recent Labs  Lab 01/12/19 2038  LATICACIDVEN 1.5    Recent Results (from the past 240 hour(s))  SARS Coronavirus 2 [facility] order, Performed in [facility] hospital lab) Nasopharyngeal Nasopharyngeal Swab     Status: None   Collection Time: 01/12/19  6:34 PM   Specimen: Nasopharyngeal Swab  Result Value Ref Range Status   SARS Coronavirus 2 NEGATIVE NEGATIVE Final    Comment: (NOTE) If result is NEGATIVE SARS-CoV-2 target nucleic acids are NOT DETECTED. The SARS-CoV-2 RNA is generally detectable in upper and lower  respiratory specimens during the acute phase of infection. The lowest  concentration of SARS-CoV-2 viral copies this assay can detect is 250  copies / mL. A negative result does not preclude SARS-CoV-2 infection  and  should not be used as the sole basis for treatment or other  patient management decisions.  A negative result may occur with  improper specimen collection / handling, submission of specimen other  than nasopharyngeal swab, presence of viral mutation(s) within the  areas targeted by this assay, and inadequate number of viral copies  (<250 copies / mL). A negative result must be combined with clinical  observations, patient history, and epidemiological information. If result is POSITIVE SARS-CoV-2 target nucleic acids are DETECTED. The SARS-CoV-2 RNA is generally detectable in upper and lower  respiratory specimens dur ing the acute phase of infection.  Positive  results are indicative of active infection with SARS-CoV-2.  Clinical  correlation with patient history and other diagnostic information is  necessary to determine patient infection status.   Positive results do  not rule out bacterial infection or co-infection with other viruses. If result is PRESUMPTIVE POSTIVE SARS-CoV-2 nucleic acids MAY BE PRESENT.   A presumptive positive result was obtained on the submitted specimen  and confirmed on repeat testing.  While 2019 novel coronavirus  (SARS-CoV-2) nucleic acids may be present in the submitted sample  additional confirmatory testing may be necessary for epidemiological  and / or clinical management purposes  to differentiate between  SARS-CoV-2 and other Sarbecovirus currently known to infect humans.  If clinically indicated additional testing with an alternate test  methodology [phone]) is advised. The SARS-CoV-2 RNA is generally  detectable in upper and lower respiratory sp ecimens during the acute  phase of infection. The expected result is Negative. Fact Sheet for Patients:  StrictlyIdeas.no Fact Sheet for Healthcare Providers: BankingDealers.co.za This test is not yet approved or cleared by the [place] FDA and has been authorized for detection and/or diagnosis of SARS-CoV-2 by FDA under an Emergency Use Authorization (EUA).  This EUA will remain in effect (meaning this test can be used) for the duration of the COVID-19 declaration under Section 564(b)(1) of the Act, 21 U.S.C. section 360bbb-3(b)(1), unless the authorization is terminated or revoked sooner. Performed at [facility], [place] [address], [place], [place] [address]          Radiology Studies: Ct Abdomen Pelvis W Contrast  Result Date: 01/12/2019 CLINICAL DATA:  Abdominal pain, distension EXAM: CT ABDOMEN AND PELVIS WITH CONTRAST TECHNIQUE: Multidetector CT imaging of the abdomen and pelvis was performed using the standard protocol following bolus administration of intravenous contrast. CONTRAST:  56mL OMNIPAQUE IOHEXOL 300 MG/ML  SOLN COMPARISON:  None. FINDINGS: Lower chest:  Basilar areas of atelectasis and/or scarring. Normal heart size. No pericardial effusion. Hepatobiliary: No focal liver abnormality is seen. No gallstones, gallbladder wall thickening, or biliary dilatation. Rounded calcifications along the dome of the liver, do not clearly appear to reside within the hepatic parenchyma, possibly reflecting peritoneal loose body. No gallbladder wall thickening or calcified gallstones. No biliary ductal dilatation. Pancreas: Unremarkable. No pancreatic ductal dilatation or surrounding inflammatory changes. Spleen: Normal in size without focal abnormality. Adrenals/Urinary Tract: 11 mm hypoattenuating nodule in the left adrenal gland, indeterminate on this single phase exam. Right adrenal gland is unremarkable. Fluid attenuation 3.7 cm cystic structure arising from the upper pole right kidney. 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 Mild bilateral symmetric perinephric stranding, a nonspecific finding though may correlate with either age or decreased renal function. Kidneys are otherwise unremarkable, without renal calculi, suspicious lesion, or hydronephrosis. Bladder is unremarkable. Stomach/Bowel: Small hiatal hernia. Stomach and duodenum are unremarkable. There  are focally clustered loops of thickened small bowel in the mid abdomen with adjacent hazy stranding within partially twisted mesentery. No dilatation or convincing evidence of obstruction though abrupt transition points between the thickened and non thickened bowel are visualized (coronal 5/44, 5/34). Distal small bowel is more unremarkable in appearance. There is prior right hemicolectomy with ileocolic anastomosis. Scattered colonic diverticular present throughout the remaining portion of the colon. No focal pericolonic inflammation to suggest diverticulitis. Vascular/Lymphatic: Atherosclerotic plaque within the normal caliber aorta. No proximal occlusions are  visualized. No suspicious or enlarged lymph nodes in the included lymphatic chains. Reproductive: Anteverted uterus. No concerning adnexal lesions. Small amount of fluid in the left adnexa, likely reactive. Other: Reactive free fluid is seen throughout the abdomen, within the paracolic gutters, layering in the pelvis, and in the subhepatic space. Lobular centrally fat attenuation lesion along the right paracolic gutter with peripheral calcification is compatible with an area of postsurgical fat necrosis or a torsed epiploic appendage. Large fat containing ventral hernia with a 9.7 by 4.4 cm fascial defect. Finding is in the region of postsurgical scarring, likely incisional hernia. Adjacent to this is a high view lower region of fluid attenuation in the subcutaneous fat, possibly postsurgical in nature such as related to prior ostomy take-down. Musculoskeletal: Multilevel degenerative changes are present in the imaged portions of the spine. IMPRESSION: 1. Focally clustered loops of thickened small bowel in the mid abdomen with adjacent hazy stranding within partially twisted mesentery. Findings are raise concern for possible small bowel ischemia, possibly the result of adhesion or internal hernia. No high-grade mechanical obstruction or ileus is seen. 2. Large fat containing ventral hernia with a 9.7 by 4.4 cm fascial defect. Finding is in the region of postsurgical scarring. Additional subcutaneous fluid collections adjacent this incision site could reflect postoperative seroma or hematoma. 3. 11 mm hypoattenuating nodule in the left adrenal gland, indeterminate on this single phase exam. Consider further evaluation with adrenal protocol CT or MRI. 4. Aortic Atherosclerosis (ICD10-I70.0). These results were called by telephone at the time of interpretation on 01/12/2019 at 7:39 pm to provider [facility] , who verbally acknowledged these results. Electronically Signed   By: Lovena Le M.D.   On: 01/12/2019 19:40         Scheduled Meds: . insulin aspart  0-9 Units Subcutaneous Q4H  . sodium chloride flush  3 mL Intravenous Once   Continuous Infusions:   LOS: 0 days     Georgette Shell, MD [facility]   If 7PM-7AM, please contact night-coverage [email or web address] Password [email or web address] 01/13/2019, 1:09 PM

## 2019-01-13 NOTE — Progress Notes (Signed)
[facility]/Trauma Progress Note      Assessment/Plan Ventral hernia Left adrenal nodule Atherosclerosis DM HTN Morbid obesity - BMI 42  Thickened, edematous loops of small bowel - gastroenteritis ? - Patient minimally tender on exam and no vomiting or diarrhea since admission  FEN: CLD VTE: SCD's, chemical prophylaxis per medicine ID: None, afebrile, WBC 8.4 no indication for antibiotics at this time Foley: None Follow up: TBD  DISPO: Will attempt trial of clear liquid diet today around noon to see how she does.  Ambulate    LOS: 0 days    Subjective/CC:  Patient states she feels much better today, "she feels human".  She denies vomiting or diarrhea since admission.  Her abdominal pain has resolved.  She has not been out of bed for fear the pain will return.  Objective: Vital signs in last 24 hours: Temp:  [97.7 F (36.5 C)-99.1 F (37.3 C)] 97.7 F (36.5 C) (09/11 0416) Pulse Rate:  [64-86] 64 (09/11 0416) Resp:  [15-27] 18 (09/11 0416) BP: (96-165)/(53-88) 110/62 (09/11 0432) SpO2:  [93 %-99 %] 94 % (09/11 0416) Weight:  [104.3 kg] 104.3 kg (09/10 1516) Last BM Date: 01/13/19  Intake/Output from previous day: 09/10 0701 - 09/11 0700 In: 1452.5 [I.V.:452.5; IV Piggyback:1000] Out: 0  Intake/Output this shift: No intake/output data recorded.  PE: Gen:  Alert, NAD, pleasant, cooperative Pulm: Rate and effort normal Abd: Soft, obese, very mild distention, +BS, no HSM, very mild generalized TTP without guarding.  No peritonitis Skin: no rashes noted, warm and dry   Anti-infectives: Anti-infectives (From admission, onward)   None      Lab Results:  Recent Labs    01/12/19 1631 01/13/19 0318  WBC 11.5* 8.4  HGB 13.9 12.2  HCT 43.1 38.4  PLT 389 337   BMET Recent Labs    01/12/19 1631 01/13/19 0318  NA 140 142  K 4.3 4.4  CL 105 110  CO2 24 22  GLUCOSE 221* 156*  BUN 28* 21  CREATININE 1.49* 1.40*  CALCIUM 8.8* 8.4*    PT/INR No results for input(s): LABPROT, INR in the last 72 hours. CMP     Component Value Date/Time   NA 142 01/13/2019 0318   K 4.4 01/13/2019 0318   CL 110 01/13/2019 0318   CO2 22 01/13/2019 0318   GLUCOSE 156 (H) 01/13/2019 0318   BUN 21 01/13/2019 0318   CREATININE 1.40 (H) 01/13/2019 0318   CALCIUM 8.4 (L) 01/13/2019 0318   PROT 6.2 (L) 01/13/2019 0318   ALBUMIN 2.9 (L) 01/13/2019 0318   AST 19 01/13/2019 0318   ALT 29 01/13/2019 0318   ALKPHOS 44 01/13/2019 0318   BILITOT 0.7 01/13/2019 0318   GFRNONAA 39 (L) 01/13/2019 0318   GFRAA 45 (L) 01/13/2019 0318   Lipase     Component Value Date/Time   LIPASE 32 01/12/2019 1631    Studies/Results: Ct Abdomen Pelvis W Contrast  Result Date: 01/12/2019 CLINICAL DATA:  Abdominal pain, distension EXAM: CT ABDOMEN AND PELVIS WITH CONTRAST TECHNIQUE: Multidetector CT imaging of the abdomen and pelvis was performed using the standard protocol following bolus administration of intravenous contrast. CONTRAST:  10mL OMNIPAQUE IOHEXOL 300 MG/ML  SOLN COMPARISON:  None. FINDINGS: Lower chest: Basilar areas of atelectasis and/or scarring. Normal heart size. No pericardial effusion. Hepatobiliary: No focal liver abnormality is seen. No gallstones, gallbladder wall thickening, or biliary dilatation. Rounded calcifications along the dome of the liver, do not clearly appear to reside within the  hepatic parenchyma, possibly reflecting peritoneal loose body. No gallbladder wall thickening or calcified gallstones. No biliary ductal dilatation. Pancreas: Unremarkable. No pancreatic ductal dilatation or surrounding inflammatory changes. Spleen: Normal in size without focal abnormality. Adrenals/Urinary Tract: 11 mm hypoattenuating nodule in the left adrenal gland, indeterminate on this single phase exam. Right adrenal gland is unremarkable. Fluid attenuation 3.7 cm cystic structure arising from the upper pole right kidney. 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 Mild bilateral symmetric perinephric stranding, a nonspecific finding though may correlate with either age or decreased renal function. Kidneys are otherwise unremarkable, without renal calculi, suspicious lesion, or hydronephrosis. Bladder is unremarkable. Stomach/Bowel: Small hiatal hernia. Stomach and duodenum are unremarkable. There are focally clustered loops of thickened small bowel in the mid abdomen with adjacent hazy stranding within partially twisted mesentery. No dilatation or convincing evidence of obstruction though abrupt transition points between the thickened and non thickened bowel are visualized (coronal 5/44, 5/34). Distal small bowel is more unremarkable in appearance. There is prior right hemicolectomy with ileocolic anastomosis. Scattered colonic diverticular present throughout the remaining portion of the colon. No focal pericolonic inflammation to suggest diverticulitis. Vascular/Lymphatic: Atherosclerotic plaque within the normal caliber aorta. No proximal occlusions are visualized. No suspicious or enlarged lymph nodes in the included lymphatic chains. Reproductive: Anteverted uterus. No concerning adnexal lesions. Small amount of fluid in the left adnexa, likely reactive. Other: Reactive free fluid is seen throughout the abdomen, within the paracolic gutters, layering in the pelvis, and in the subhepatic space. Lobular centrally fat attenuation lesion along the right paracolic gutter with peripheral calcification is compatible with an area of postsurgical fat necrosis or a torsed epiploic appendage. Large fat containing ventral hernia with a 9.7 by 4.4 cm fascial defect. Finding is in the region of postsurgical scarring, likely incisional hernia. Adjacent to this is a high view lower region of fluid attenuation in the subcutaneous fat, possibly postsurgical in nature such as related to prior ostomy take-down.  Musculoskeletal: Multilevel degenerative changes are present in the imaged portions of the spine. IMPRESSION: 1. Focally clustered loops of thickened small bowel in the mid abdomen with adjacent hazy stranding within partially twisted mesentery. Findings are raise concern for possible small bowel ischemia, possibly the result of adhesion or internal hernia. No high-grade mechanical obstruction or ileus is seen. 2. Large fat containing ventral hernia with a 9.7 by 4.4 cm fascial defect. Finding is in the region of postsurgical scarring. Additional subcutaneous fluid collections adjacent this incision site could reflect postoperative seroma or hematoma. 3. 11 mm hypoattenuating nodule in the left adrenal gland, indeterminate on this single phase exam. Consider further evaluation with adrenal protocol CT or MRI. 4. Aortic Atherosclerosis (ICD10-I70.0). These results were called by telephone at the time of interpretation on 01/12/2019 at 7:39 pm to provider [facility] , who verbally acknowledged these results. Electronically Signed   By: Lovena Le M.D.   On: 01/12/2019 19:40      Kalman Drape , [facility] Surgery 01/13/2019, 8:45 AM  Pager: [phone] Mon-Wed, Friday 7:00am-4:30pm Thurs 7am-11:30am  Consults: [phone]

## 2019-01-14 LAB — CBC
HCT: 37.6 % (ref 36.0–46.0)
Hemoglobin: 12.2 g/dL (ref 12.0–15.0)
MCH: 32.5 pg (ref 26.0–34.0)
MCHC: 32.4 g/dL (ref 30.0–36.0)
MCV: 100.3 fL — ABNORMAL HIGH (ref 80.0–100.0)
Platelets: 310 10*3/uL (ref 150–400)
RBC: 3.75 MIL/uL — ABNORMAL LOW (ref 3.87–5.11)
RDW: 14.2 % (ref 11.5–15.5)
WBC: 5.5 10*3/uL (ref 4.0–10.5)
nRBC: 0 % (ref 0.0–0.2)

## 2019-01-14 LAB — COMPREHENSIVE METABOLIC PANEL
ALT: 26 U/L (ref 0–44)
AST: 20 U/L (ref 15–41)
Albumin: 3.1 g/dL — ABNORMAL LOW (ref 3.5–5.0)
Alkaline Phosphatase: 45 U/L (ref 38–126)
Anion gap: 8 (ref 5–15)
BUN: 16 mg/dL (ref 8–23)
CO2: 22 mmol/L (ref 22–32)
Calcium: 8.8 mg/dL — ABNORMAL LOW (ref 8.9–10.3)
Chloride: 110 mmol/L (ref 98–111)
Creatinine, Ser: 1.23 mg/dL — ABNORMAL HIGH (ref 0.44–1.00)
GFR calc Af Amer: 52 mL/min — ABNORMAL LOW (ref >60)
GFR calc non Af Amer: 45 mL/min — ABNORMAL LOW (ref >60)
Glucose, Bld: 116 mg/dL — ABNORMAL HIGH (ref 70–99)
Potassium: 3.5 mmol/L (ref 3.5–5.1)
Sodium: 140 mmol/L (ref 135–145)
Total Bilirubin: 1 mg/dL (ref 0.3–1.2)
Total Protein: 6.6 g/dL (ref 6.5–8.1)

## 2019-01-14 LAB — GLUCOSE, CAPILLARY
Glucose-Capillary: 100 mg/dL — ABNORMAL HIGH (ref 70–99)
Glucose-Capillary: 119 mg/dL — ABNORMAL HIGH (ref 70–99)
Glucose-Capillary: 112 mg/dL — ABNORMAL HIGH (ref 70–99)

## 2019-01-14 LAB — HEMOGLOBIN A1C
Hgb A1c MFr Bld: 8.2 % — ABNORMAL HIGH (ref 4.8–5.6)
Mean Plasma Glucose: 189 mg/dL

## 2019-01-14 MED ORDER — ONDANSETRON HCL 4 MG PO TABS: 4.0000 mg | ORAL_TABLET | Freq: Four times a day (QID) | ORAL | 0 refills | Status: DC | PRN

## 2019-01-14 MED ORDER — METFORMIN HCL 500 MG PO TABS: 500.0000 mg | ORAL_TABLET | Freq: Two times a day (BID) | ORAL | 11 refills | Status: DC

## 2019-01-14 NOTE — Plan of Care (Signed)
Assessment unchanged. Pt verbalized understanding through teach back. Understands follow up care. Discharged via wc accompanied by NT to front entrance to meet husband.

## 2019-01-14 NOTE — Discharge Summary (Signed)
Physician Discharge Summary  Christina [name] [id number] DOB: 05/09/1950 DOA: 01/12/2019  PCP: [name], MD  Admit date: 01/12/2019 Discharge date: 01/14/2019  Admitted From: Home Disposition: Home  Recommendations for Outpatient Follow-up:  1. Follow up with PCP in 1-2 weeks 2. Please obtain BMP/CBC in one week  Home Health: Yes  equipment/Devices: 3 in 1  Discharge Condition: Stable CODE STATUS: Full code Diet recommendation: Cardiac Brief/Interim Summary:68 y.o.femalewith medical history significant forcolon cancer status post resection in 2006, type 2 diabetes mellitus, hypertension, and obesity (BMI 42), now presenting to the emergency department for evaluation of abdominal pain, nausea, vomiting, and diarrhea. Patient reported that her symptoms began earlier today and include nausea with nonbloody vomiting and approximately 6 episodes of diarrhea. When symptoms began, she was experiencing discomfort across the lower abdomen, but in the ED, she reports that the discomfort is now mainly in her upper abdomen. She felt as though she might be having a fever earlier today. She denies any sick contacts, travel, or recent antibiotics. She denies melena or hematochezia. She has had 2 episodes of both vomiting and diarrhea in ED.  ED Course:Upon arrival to the ED, patient is found to be afebrile, saturating mid 90s on room air, and with stable blood pressure. EKG features a sinus rhythm. Chemistry panel is notable for creatinine 1.49, up from 1.12 in January 2020. CBC features a leukocytosis to 11,500. Lactic acid is reassuringly normal. COVID-19 is negative. CT the abdomen and pelvis is concerning for focally clustered loops of thickened small bowel with hazy stranding and partially twisted mesentery concerning for possible small bowel ischemia. Also noted on the CT is ventral hernia with possible adjacent fluid collection, and left adrenal nodule. Patient was given a  liter of normal saline, morphine, and Zofran in the emergency department. Surgery was consulted by the ED physician and recommended medical admission.   Discharge Diagnoses:  Principal Problem:   Abdominal pain, vomiting, and diarrhea Active Problems:   Diabetes mellitus without complication (HCC)   Hypertension   Renal insufficiency   Adrenal nodule ([place])   #1 gastroenteritis-patient admitted with nausea vomiting diarrhea which  Resolved with the symptomatic treatment.  Patient tolerated her diet without any nausea vomiting or diarrhea or abdominal pain. CT shows thickened small bowel loops?  Ischemia versus enteritis.  CT scan Focally clustered loops of thickened small bowel in the mid abdomen with adjacent hazy stranding within partially twisted mesentery. Findings are raise concern for possible small bowel ischemia, possibly the result of adhesion or internal hernia. No high-grade mechanical obstruction or ileus is seen.  Large fat containing ventral hernia with a 9.7 by 4.4 cm fascial defect. Finding is in the region of postsurgical scarring. Additional subcutaneous fluid collections adjacent this incision site could reflect postoperative seroma or hematoma.  11 mm hypoattenuating nodule in the left adrenal gland, indeterminate on this single phase exam. Consider further evaluation with adrenal protocol CT or MRI.  Aortic Atherosclerosis (ICD10-I70.0).    Patient is asked to follow-up with her PCP and check CT adrenal protocol or MRI for further work-up of hypoattenuating nodule in the left adrenal gland.  It was not done inpatient due to AKI.  #2 AKI improved with fluids and holding hydrochlorothiazide ACE inhibitor and metformin. #3 type 2 diabetes restart metformin at a lower dose as blood sugars have been stable   #4 hypertension restart amlodipine and ARB DC HCTZ  #5 left adrenal nodule incidental finding on CT scan follow-up as outpatient  Consultants:  General  surgery  Estimated body mass index is 44.07 kg/m as calculated from the following:   Height as of this encounter: 5\' 2"  (1.575 m).   Weight as of this encounter: 109.3 kg.  Discharge Instructions  Discharge Instructions    Call MD for:  difficulty breathing, headache or visual disturbances   Complete by: As directed    Call MD for:  persistant nausea and vomiting   Complete by: As directed    Call MD for:  temperature >100.4   Complete by: As directed    Diet - low sodium heart healthy   Complete by: As directed    Face-to-face encounter (required for Medicare/Medicaid patients)   Complete by: As directed    [facility] certify that this patient is under my care and that I, or a nurse practitioner or physician's assistant working with me, had a face-to-face encounter that meets the physician face-to-face encounter requirements with this patient on 01/14/2019. The encounter with the patient was in whole, or in part for the following medical condition(s) which is the primary reason for home health care (List medical condition): enteritis dm htn   The encounter with the patient was in whole, or in part, for the following medical condition, which is the primary reason for home health care: enteritis   I certify that, based on my findings, the following services are medically necessary home health services: Physical therapy   Reason for Medically Necessary Home Health Services: Therapy- Personnel officer, Public librarian   My clinical findings support the need for the above services: Unsafe ambulation due to balance issues   Further, I certify that my clinical findings support that this patient is homebound due to: Unable to leave home safely without assistance   Home Health   Complete by: As directed    To provide the following care/treatments: PT   Increase activity slowly   Complete by: As directed      Allergies as of 01/14/2019      Reactions   Atorvastatin     Other reaction(s): Cramps (ALLERGY/intolerance), Other   Penicillins Dermatitis, Rash   Skin and nails peel    Rosuvastatin    Other reaction(s): Cramps (ALLERGY/intolerance)   Rosuvastatin Calcium       Medication List    STOP taking these medications   chlorthalidone 25 MG tablet Commonly known as: HYGROTON   metFORMIN 500 MG (MOD) 24 hr tablet Commonly known as: GLUMETZA Replaced by: metFORMIN 500 MG tablet     TAKE these medications   amLODipine-benazepril 10-40 MG capsule Commonly known as: LOTREL Take 1 capsule by mouth daily.   metFORMIN 500 MG tablet Commonly known as: Glucophage Take 1 tablet (500 mg total) by mouth 2 (two) times daily with a meal. Replaces: metFORMIN 500 MG (MOD) 24 hr tablet   ondansetron 4 MG tablet Commonly known as: ZOFRAN Take 1 tablet (4 mg total) by mouth every 6 (six) hours as needed for nausea.      Follow-up Information    [name], MD Follow up.   Specialty: Family Medicine Why: Patient needs MRI of the abdomen or CT adrenal gland to follow-up on an adrenal lesion that was found during the hospital stay.  Patient had renal insufficiency which is improving with IV hydration.  Please check a BMP prior to CT scan to make sure her r Contact information: [place] [address] [phone]  Allergies  Allergen Reactions  . Atorvastatin     Other reaction(s): Cramps (ALLERGY/intolerance), Other  . Penicillins Dermatitis and Rash    Skin and nails peel   . Rosuvastatin     Other reaction(s): Cramps (ALLERGY/intolerance)  . Rosuvastatin Calcium     Consultations: General surgery Procedures/Studies: Ct Abdomen Pelvis W Contrast  Result Date: 01/12/2019 CLINICAL DATA:  Abdominal pain, distension EXAM: CT ABDOMEN AND PELVIS WITH CONTRAST TECHNIQUE: Multidetector CT imaging of the abdomen and pelvis was performed using the standard protocol following bolus administration of intravenous  contrast. CONTRAST:  53mL OMNIPAQUE IOHEXOL 300 MG/ML  SOLN COMPARISON:  None. FINDINGS: Lower chest: Basilar areas of atelectasis and/or scarring. Normal heart size. No pericardial effusion. Hepatobiliary: No focal liver abnormality is seen. No gallstones, gallbladder wall thickening, or biliary dilatation. Rounded calcifications along the dome of the liver, do not clearly appear to reside within the hepatic parenchyma, possibly reflecting peritoneal loose body. No gallbladder wall thickening or calcified gallstones. No biliary ductal dilatation. Pancreas: Unremarkable. No pancreatic ductal dilatation or surrounding inflammatory changes. Spleen: Normal in size without focal abnormality. Adrenals/Urinary Tract: 11 mm hypoattenuating nodule in the left adrenal gland, indeterminate on this single phase exam. Right adrenal gland is unremarkable. Fluid attenuation 3.7 cm cystic structure arising from the upper pole right kidney. 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 Mild bilateral symmetric perinephric stranding, a nonspecific finding though may correlate with either age or decreased renal function. Kidneys are otherwise unremarkable, without renal calculi, suspicious lesion, or hydronephrosis. Bladder is unremarkable. Stomach/Bowel: Small hiatal hernia. Stomach and duodenum are unremarkable. There are focally clustered loops of thickened small bowel in the mid abdomen with adjacent hazy stranding within partially twisted mesentery. No dilatation or convincing evidence of obstruction though abrupt transition points between the thickened and non thickened bowel are visualized (coronal 5/44, 5/34). Distal small bowel is more unremarkable in appearance. There is prior right hemicolectomy with ileocolic anastomosis. Scattered colonic diverticular present throughout the remaining portion of the colon. No focal pericolonic inflammation to suggest diverticulitis.  Vascular/Lymphatic: Atherosclerotic plaque within the normal caliber aorta. No proximal occlusions are visualized. No suspicious or enlarged lymph nodes in the included lymphatic chains. Reproductive: Anteverted uterus. No concerning adnexal lesions. Small amount of fluid in the left adnexa, likely reactive. Other: Reactive free fluid is seen throughout the abdomen, within the paracolic gutters, layering in the pelvis, and in the subhepatic space. Lobular centrally fat attenuation lesion along the right paracolic gutter with peripheral calcification is compatible with an area of postsurgical fat necrosis or a torsed epiploic appendage. Large fat containing ventral hernia with a 9.7 by 4.4 cm fascial defect. Finding is in the region of postsurgical scarring, likely incisional hernia. Adjacent to this is a high view lower region of fluid attenuation in the subcutaneous fat, possibly postsurgical in nature such as related to prior ostomy take-down. Musculoskeletal: Multilevel degenerative changes are present in the imaged portions of the spine. IMPRESSION: 1. Focally clustered loops of thickened small bowel in the mid abdomen with adjacent hazy stranding within partially twisted mesentery. Findings are raise concern for possible small bowel ischemia, possibly the result of adhesion or internal hernia. No high-grade mechanical obstruction or ileus is seen. 2. Large fat containing ventral hernia with a 9.7 by 4.4 cm fascial defect. Finding is in the region of postsurgical scarring. Additional subcutaneous fluid collections adjacent this incision site could reflect postoperative seroma or hematoma. 3. 11 mm hypoattenuating nodule  in the left adrenal gland, indeterminate on this single phase exam. Consider further evaluation with adrenal protocol CT or MRI. 4. Aortic Atherosclerosis (ICD10-I70.0). These results were called by telephone at the time of interpretation on 01/12/2019 at 7:39 pm to provider [facility] , who  verbally acknowledged these results. Electronically Signed   By: Lovena Le M.D.   On: 01/12/2019 19:40    (Echo, Carotid, EGD, Colonoscopy, ERCP)    Subjective:  Resting in bed awake alert tolerating p.o. anxious to go home Discharge Exam: Vitals:   01/14/19 0632 01/14/19 1340  BP: (!) 151/79 (!) 143/76  Pulse: 70 69  Resp: 18 18  Temp: 97.8 F (36.6 C) 98.2 F (36.8 C)  SpO2: 97% 95%   Vitals:   01/13/19 2145 01/14/19 0500 01/14/19 0632 01/14/19 1340  BP: 137/71  (!) 151/79 (!) 143/76  Pulse: 71  70 69  Resp: 17  18 18   Temp: 98.3 F (36.8 C)  97.8 F (36.6 C) 98.2 F (36.8 C)  TempSrc: Oral  Oral Oral  SpO2: 98%  97% 95%  Weight:  109.3 kg    Height:        General: Pt is alert, awake, not in acute distress Cardiovascular: RRR, S1/S2 +, no rubs, no gallops Respiratory: CTA bilaterally, no wheezing, no rhonchi Abdominal: Soft, NT, ND, bowel sounds + Extremities: no edema, no cyanosis    The results of significant diagnostics from this hospitalization (including imaging, microbiology, ancillary and laboratory) are listed below for reference.     Microbiology: Recent Results (from the past 240 hour(s))  SARS Coronavirus 2 [facility] order, Performed in [facility] hospital lab) Nasopharyngeal Nasopharyngeal Swab     Status: None   Collection Time: 01/12/19  6:34 PM   Specimen: Nasopharyngeal Swab  Result Value Ref Range Status   SARS Coronavirus 2 NEGATIVE NEGATIVE Final    Comment: (NOTE) If result is NEGATIVE SARS-CoV-2 target nucleic acids are NOT DETECTED. The SARS-CoV-2 RNA is generally detectable in upper and lower  respiratory specimens during the acute phase of infection. The lowest  concentration of SARS-CoV-2 viral copies this assay can detect is 250  copies / mL. A negative result does not preclude SARS-CoV-2 infection  and should not be used as the sole basis for treatment or other  patient management decisions.  A negative result may occur  with  improper specimen collection / handling, submission of specimen other  than nasopharyngeal swab, presence of viral mutation(s) within the  areas targeted by this assay, and inadequate number of viral copies  (<250 copies / mL). A negative result must be combined with clinical  observations, patient history, and epidemiological information. If result is POSITIVE SARS-CoV-2 target nucleic acids are DETECTED. The SARS-CoV-2 RNA is generally detectable in upper and lower  respiratory specimens dur ing the acute phase of infection.  Positive  results are indicative of active infection with SARS-CoV-2.  Clinical  correlation with patient history and other diagnostic information is  necessary to determine patient infection status.  Positive results do  not rule out bacterial infection or co-infection with other viruses. If result is PRESUMPTIVE POSTIVE SARS-CoV-2 nucleic acids MAY BE PRESENT.   A presumptive positive result was obtained on the submitted specimen  and confirmed on repeat testing.  While 2019 novel coronavirus  (SARS-CoV-2) nucleic acids may be present in the submitted sample  additional confirmatory testing may be necessary for epidemiological  and / or clinical management purposes  to differentiate between  SARS-CoV-2 and other Sarbecovirus currently known to infect humans.  If clinically indicated additional testing with an alternate test  methodology [phone]) is advised. The SARS-CoV-2 RNA is generally  detectable in upper and lower respiratory sp ecimens during the acute  phase of infection. The expected result is Negative. Fact Sheet for Patients:  StrictlyIdeas.no Fact Sheet for Healthcare Providers: BankingDealers.co.za This test is not yet approved or cleared by the [place] FDA and has been authorized for detection and/or diagnosis of SARS-CoV-2 by FDA under an Emergency Use Authorization (EUA).  This EUA  will remain in effect (meaning this test can be used) for the duration of the COVID-19 declaration under Section 564(b)(1) of the Act, 21 U.S.C. section 360bbb-3(b)(1), unless the authorization is terminated or revoked sooner. Performed at [facility], [place] [address] [place], [place] [address]      Labs: BNP (last 3 results) No results for input(s): BNP in the last 8760 hours. Basic Metabolic Panel: Recent Labs  Lab 01/12/19 1631 01/13/19 0318 01/14/19 0331  NA 140 142 140  K 4.3 4.4 3.5  CL 105 110 110  CO2 24 22 22   GLUCOSE 221* 156* 116*  BUN 28* 21 16  CREATININE 1.49* 1.40* 1.23*  CALCIUM 8.8* 8.4* 8.8*   Liver Function Tests: Recent Labs  Lab 01/12/19 1631 01/13/19 0318 01/14/19 0331  AST 27 19 20   ALT 39 29 26  ALKPHOS 59 44 45  BILITOT 0.7 0.7 1.0  PROT 7.8 6.2* 6.6  ALBUMIN 3.5 2.9* 3.1*   Recent Labs  Lab 01/12/19 1631  LIPASE 32   No results for input(s): AMMONIA in the last 168 hours. CBC: Recent Labs  Lab 01/12/19 1631 01/13/19 0318 01/14/19 0331  WBC 11.5* 8.4 5.5  NEUTROABS  --  6.3  --   HGB 13.9 12.2 12.2  HCT 43.1 38.4 37.6  MCV 98.6 100.5* 100.3*  PLT 389 337 310   Cardiac Enzymes: No results for input(s): CKTOTAL, CKMB, CKMBINDEX, TROPONINI in the last 168 hours. BNP: Invalid input(s): POCBNP CBG: Recent Labs  Lab 01/13/19 2130 01/13/19 2349 01/14/19 0632 01/14/19 0759 01/14/19 1226  GLUCAP 128* 102* 100* 119* 112*   D-Dimer No results for input(s): DDIMER in the last 72 hours. Hgb A1c Recent Labs    01/13/19 0318  HGBA1C 8.2*   Lipid Profile No results for input(s): CHOL, HDL, LDLCALC, TRIG, CHOLHDL, LDLDIRECT in the last 72 hours. Thyroid function studies No results for input(s): TSH, T4TOTAL, T3FREE, THYROIDAB in the last 72 hours.  Invalid input(s): FREET3 Anemia work up No results for input(s): VITAMINB12, FOLATE, FERRITIN, TIBC, IRON, RETICCTPCT in the last 72 hours. Urinalysis     Component Value Date/Time   COLORURINE YELLOW 01/12/2019 2000   APPEARANCEUR CLEAR 01/12/2019 2000   LABSPEC 1.021 01/12/2019 2000   PHURINE 7.0 01/12/2019 2000   GLUCOSEU NEGATIVE 01/12/2019 2000   HGBUR NEGATIVE 01/12/2019 2000   BILIRUBINUR NEGATIVE 01/12/2019 2000   KETONESUR NEGATIVE 01/12/2019 2000   PROTEINUR NEGATIVE 01/12/2019 2000   UROBILINOGEN 0.2 07/24/2013 1424   NITRITE NEGATIVE 01/12/2019 2000   LEUKOCYTESUR NEGATIVE 01/12/2019 2000   Sepsis Labs Invalid input(s): PROCALCITONIN,  WBC,  LACTICIDVEN Microbiology Recent Results (from the past 240 hour(s))  SARS Coronavirus 2 [facility] order, Performed in [facility] hospital lab) Nasopharyngeal Nasopharyngeal Swab     Status: None   Collection Time: 01/12/19  6:34 PM   Specimen: Nasopharyngeal Swab  Result Value Ref Range Status   SARS Coronavirus 2  NEGATIVE NEGATIVE Final    Comment: (NOTE) If result is NEGATIVE SARS-CoV-2 target nucleic acids are NOT DETECTED. The SARS-CoV-2 RNA is generally detectable in upper and lower  respiratory specimens during the acute phase of infection. The lowest  concentration of SARS-CoV-2 viral copies this assay can detect is 250  copies / mL. A negative result does not preclude SARS-CoV-2 infection  and should not be used as the sole basis for treatment or other  patient management decisions.  A negative result may occur with  improper specimen collection / handling, submission of specimen other  than nasopharyngeal swab, presence of viral mutation(s) within the  areas targeted by this assay, and inadequate number of viral copies  (<250 copies / mL). A negative result must be combined with clinical  observations, patient history, and epidemiological information. If result is POSITIVE SARS-CoV-2 target nucleic acids are DETECTED. The SARS-CoV-2 RNA is generally detectable in upper and lower  respiratory specimens dur ing the acute phase of infection.  Positive  results are  indicative of active infection with SARS-CoV-2.  Clinical  correlation with patient history and other diagnostic information is  necessary to determine patient infection status.  Positive results do  not rule out bacterial infection or co-infection with other viruses. If result is PRESUMPTIVE POSTIVE SARS-CoV-2 nucleic acids MAY BE PRESENT.   A presumptive positive result was obtained on the submitted specimen  and confirmed on repeat testing.  While 2019 novel coronavirus  (SARS-CoV-2) nucleic acids may be present in the submitted sample  additional confirmatory testing may be necessary for epidemiological  and / or clinical management purposes  to differentiate between  SARS-CoV-2 and other Sarbecovirus currently known to infect humans.  If clinically indicated additional testing with an alternate test  methodology [phone]) is advised. The SARS-CoV-2 RNA is generally  detectable in upper and lower respiratory sp ecimens during the acute  phase of infection. The expected result is Negative. Fact Sheet for Patients:  StrictlyIdeas.no Fact Sheet for Healthcare Providers: BankingDealers.co.za This test is not yet approved or cleared by the [place] FDA and has been authorized for detection and/or diagnosis of SARS-CoV-2 by FDA under an Emergency Use Authorization (EUA).  This EUA will remain in effect (meaning this test can be used) for the duration of the COVID-19 declaration under Section 564(b)(1) of the Act, 21 U.S.C. section 360bbb-3(b)(1), unless the authorization is terminated or revoked sooner. Performed at [facility], [place] [address], [place], [place] [address]      Time coordinating discharge: 34 minutes  SIGNED:   Georgette Shell, MD  [facility] 01/14/2019, 2:27 PM Pager   If 7PM-7AM, please contact night-coverage [email or web address] Password [email or web address]

## 2019-01-14 NOTE — Progress Notes (Signed)
[facility]/Trauma Progress Note      Assessment/Plan Ventral hernia Left adrenal nodule Atherosclerosis DM HTN Morbid obesity - BMI 42  Thickened, edematous loops of small bowel - gastroenteritis ? - Patient minimally tender on exam and no vomiting or diarrhea since admission  FEN: advance to solid foods VTE: SCD's, chemical prophylaxis per medicine ID: None, afebrile, WBC wnl. no indication for antibiotics at this time Foley: None Follow up: at [facility] to discuss hernia  DISPO: Advance to solid diet, ok to d/c from our standpoint if she tolerates this.  Will sign off    LOS: 1 day    Subjective/CC:  Patient states she feels much better today.  Her abdominal pain has resolved.  She has tolerated clears.  Objective: Vital signs in last 24 hours: Temp:  [97.5 F (36.4 C)-98.3 F (36.8 C)] 97.8 F (36.6 C) (09/12 [id number]) Pulse Rate:  [63-71] 70 (09/12 0632) Resp:  [17-18] 18 (09/12 [id number]) BP: (137-181)/(68-79) 151/79 (09/12 0632) SpO2:  [97 %-98 %] 97 % (09/12 [id number]) Weight:  [109.3 kg] 109.3 kg (09/12 0500) Last BM Date: 01/13/19  Intake/Output from previous day: 09/11 0701 - 09/12 0700 In: 2563 [P.O.:1200; I.V.:1363] Out: 1650 [Urine:1650] Intake/Output this shift: No intake/output data recorded.  PE: Gen:  Alert, NAD, pleasant, cooperative Pulm: Rate and effort normal Abd: Soft, obese,no distention,  No  TTP or guarding.  No peritonitis Skin: no rashes noted, warm and dry   Anti-infectives: Anti-infectives (From admission, onward)   None      Lab Results:  Recent Labs    01/13/19 0318 01/14/19 0331  WBC 8.4 5.5  HGB 12.2 12.2  HCT 38.4 37.6  PLT 337 310   BMET Recent Labs    01/13/19 0318 01/14/19 0331  NA 142 140  K 4.4 3.5  CL 110 110  CO2 22 22  GLUCOSE 156* 116*  BUN 21 16  CREATININE 1.40* 1.23*  CALCIUM 8.4* 8.8*   PT/INR No results for input(s): LABPROT, INR in the last 72 hours. CMP     Component Value  Date/Time   NA 140 01/14/2019 0331   K 3.5 01/14/2019 0331   CL 110 01/14/2019 0331   CO2 22 01/14/2019 0331   GLUCOSE 116 (H) 01/14/2019 0331   BUN 16 01/14/2019 0331   CREATININE 1.23 (H) 01/14/2019 0331   CALCIUM 8.8 (L) 01/14/2019 0331   PROT 6.6 01/14/2019 0331   ALBUMIN 3.1 (L) 01/14/2019 0331   AST 20 01/14/2019 0331   ALT 26 01/14/2019 0331   ALKPHOS 45 01/14/2019 0331   BILITOT 1.0 01/14/2019 0331   GFRNONAA 45 (L) 01/14/2019 0331   GFRAA 52 (L) 01/14/2019 0331   Lipase     Component Value Date/Time   LIPASE 32 01/12/2019 1631    Studies/Results: Ct Abdomen Pelvis W Contrast  Result Date: 01/12/2019 CLINICAL DATA:  Abdominal pain, distension EXAM: CT ABDOMEN AND PELVIS WITH CONTRAST TECHNIQUE: Multidetector CT imaging of the abdomen and pelvis was performed using the standard protocol following bolus administration of intravenous contrast. CONTRAST:  37mL OMNIPAQUE IOHEXOL 300 MG/ML  SOLN COMPARISON:  None. FINDINGS: Lower chest: Basilar areas of atelectasis and/or scarring. Normal heart size. No pericardial effusion. Hepatobiliary: No focal liver abnormality is seen. No gallstones, gallbladder wall thickening, or biliary dilatation. Rounded calcifications along the dome of the liver, do not clearly appear to reside within the hepatic parenchyma, possibly reflecting peritoneal loose body. No gallbladder wall thickening or calcified gallstones. No biliary ductal dilatation. Pancreas:  Unremarkable. No pancreatic ductal dilatation or surrounding inflammatory changes. Spleen: Normal in size without focal abnormality. Adrenals/Urinary Tract: 11 mm hypoattenuating nodule in the left adrenal gland, indeterminate on this single phase exam. Right adrenal gland is unremarkable. Fluid attenuation 3.7 cm cystic structure arising from the upper pole right kidney. 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 Mild bilateral symmetric  perinephric stranding, a nonspecific finding though may correlate with either age or decreased renal function. Kidneys are otherwise unremarkable, without renal calculi, suspicious lesion, or hydronephrosis. Bladder is unremarkable. Stomach/Bowel: Small hiatal hernia. Stomach and duodenum are unremarkable. There are focally clustered loops of thickened small bowel in the mid abdomen with adjacent hazy stranding within partially twisted mesentery. No dilatation or convincing evidence of obstruction though abrupt transition points between the thickened and non thickened bowel are visualized (coronal 5/44, 5/34). Distal small bowel is more unremarkable in appearance. There is prior right hemicolectomy with ileocolic anastomosis. Scattered colonic diverticular present throughout the remaining portion of the colon. No focal pericolonic inflammation to suggest diverticulitis. Vascular/Lymphatic: Atherosclerotic plaque within the normal caliber aorta. No proximal occlusions are visualized. No suspicious or enlarged lymph nodes in the included lymphatic chains. Reproductive: Anteverted uterus. No concerning adnexal lesions. Small amount of fluid in the left adnexa, likely reactive. Other: Reactive free fluid is seen throughout the abdomen, within the paracolic gutters, layering in the pelvis, and in the subhepatic space. Lobular centrally fat attenuation lesion along the right paracolic gutter with peripheral calcification is compatible with an area of postsurgical fat necrosis or a torsed epiploic appendage. Large fat containing ventral hernia with a 9.7 by 4.4 cm fascial defect. Finding is in the region of postsurgical scarring, likely incisional hernia. Adjacent to this is a high view lower region of fluid attenuation in the subcutaneous fat, possibly postsurgical in nature such as related to prior ostomy take-down. Musculoskeletal: Multilevel degenerative changes are present in the imaged portions of the spine.  IMPRESSION: 1. Focally clustered loops of thickened small bowel in the mid abdomen with adjacent hazy stranding within partially twisted mesentery. Findings are raise concern for possible small bowel ischemia, possibly the result of adhesion or internal hernia. No high-grade mechanical obstruction or ileus is seen. 2. Large fat containing ventral hernia with a 9.7 by 4.4 cm fascial defect. Finding is in the region of postsurgical scarring. Additional subcutaneous fluid collections adjacent this incision site could reflect postoperative seroma or hematoma. 3. 11 mm hypoattenuating nodule in the left adrenal gland, indeterminate on this single phase exam. Consider further evaluation with adrenal protocol CT or MRI. 4. Aortic Atherosclerosis (ICD10-I70.0). These results were called by telephone at the time of interpretation on 01/12/2019 at 7:39 pm to provider [facility] , who verbally acknowledged these results. Electronically Signed   By: Lovena Le M.D.   On: [id number]      Rosario Adie, MD  [facility]

## 2019-01-14 NOTE — Evaluation (Signed)
Physical Therapy Evaluation Patient Details Name: Christina [name] MRN: [id number] DOB: Feb 09, 1951 Today's Date: 01/14/2019   History of Present Illness  68yo female c/o abdominal pain, N/V, diarrhea. CT concerning for small bowel ischemia and possible ventral hernia. PMH hx colon CA, obesity, HTN, hx B breat surgery, R TKR  Clinical Impression   Patient received sitting at EOB, very pleasant and willing to participate in PT today. Able to complete all functional mobility with min guard and SPC today with the exception of one time need for MinA for balance when her SPC slipped on a slick spot on the floor. Discussed PT recommendation of RW at first upon return home for safety, patient agreeable. She was left up in the chair with all needs met and questions/concerns addressed this morning. She will continue to benefit from skilled PT services in the acute setting, also recommend skilled HHPT services moving forward.     Follow Up Recommendations Home health PT;Supervision for mobility/OOB    Equipment Recommendations  3in1 (PT)    Recommendations for Other Services       Precautions / Restrictions Precautions Precautions: Fall Restrictions Weight Bearing Restrictions: No      Mobility  Bed Mobility               General bed mobility comments: sitting at EOB upon entry  Transfers Overall transfer level: Needs assistance Equipment used: Straight cane Transfers: Sit to/from Stand Sit to Stand: Min guard         General transfer comment: min guard for safety, no physical assist given  Ambulation/Gait Ambulation/Gait assistance: Min guard;Min assist Gait Distance (Feet): 140 Feet Assistive device: Straight cane Gait Pattern/deviations: Step-through pattern;Decreased step length - right;Decreased step length - left;Decreased dorsiflexion - right;Decreased dorsiflexion - left Gait velocity: decreased   General Gait Details: generally Min guard for gait with SPC, did have  one time need for MinA due to SPC slipping on a slick spot on floor  Stairs            Wheelchair Mobility    Modified Rankin (Stroke Patients Only)       Balance Overall balance assessment: Mild deficits observed, not formally tested                                           Pertinent Vitals/Pain Pain Assessment: No/denies pain    Home Living Family/patient expects to be discharged to:: Private residence Living Arrangements: Other relatives;Spouse/significant other;Children Available Help at Discharge: Family Type of Home: House Home Access: Level entry     Home Layout: Two level;Bed/bath upstairs Home Equipment: Walker - 2 wheels;Cane - single point      Prior Function Level of Independence: Independent with assistive device(s)               Hand Dominance        Extremity/Trunk Assessment   Upper Extremity Assessment Upper Extremity Assessment: Generalized weakness    Lower Extremity Assessment Lower Extremity Assessment: Generalized weakness    Cervical / Trunk Assessment Cervical / Trunk Assessment: Kyphotic  Communication   Communication: No difficulties  Cognition Arousal/Alertness: Awake/alert Behavior During Therapy: WFL for tasks assessed/performed Overall Cognitive Status: Within Functional Limits for tasks assessed  General Comments      Exercises     Assessment/Plan    PT Assessment Patient needs continued PT services  PT Problem List Decreased strength;Decreased mobility;Decreased safety awareness;Decreased coordination;Obesity;Decreased activity tolerance;Decreased balance;Decreased knowledge of use of DME       PT Treatment Interventions DME instruction;Therapeutic activities;Gait training;Therapeutic exercise;Patient/family education;Stair training;Balance training;Functional mobility training;Neuromuscular re-education    PT Goals (Current goals  can be found in the Care Plan section)  Acute Rehab PT Goals Patient Stated Goal: go home PT Goal Formulation: With patient Time For Goal Achievement: 01/28/19 Potential to Achieve Goals: Good    Frequency Min 3X/week   Barriers to discharge        Co-evaluation               AM-PAC PT "6 Clicks" Mobility  Outcome Measure Help needed turning from your back to your side while in a flat bed without using bedrails?: A Little Help needed moving from lying on your back to sitting on the side of a flat bed without using bedrails?: A Little Help needed moving to and from a bed to a chair (including a wheelchair)?: A Little Help needed standing up from a chair using your arms (e.g., wheelchair or bedside chair)?: A Little Help needed to walk in hospital room?: A Little Help needed climbing 3-5 steps with a railing? : A Lot 6 Click Score: 17    End of Session   Activity Tolerance: Patient tolerated treatment well Patient left: in chair;with call bell/phone within reach Nurse Communication: Mobility status PT Visit Diagnosis: Unsteadiness on feet (R26.81);Muscle weakness (generalized) (M62.81)    Time: 3016-0109 PT Time Calculation (min) (ACUTE ONLY): 15 min   Charges:   PT Evaluation $PT Eval Low Complexity: 1 Low          Deniece Ree PT, DPT, CBIS  Supplemental Physical Therapist [facility]    Pager [phone] Acute Rehab Office [phone]

## 2019-01-15 NOTE — Progress Notes (Signed)
CM called patient to discuss need for [facility] services, no answer, voicemail left requesting a call back.

## 2019-01-16 DIAGNOSIS — E119 Type 2 diabetes mellitus without complications: Secondary | ICD-10-CM | POA: Diagnosis not present

## 2019-01-16 DIAGNOSIS — H25813 Combined forms of age-related cataract, bilateral: Secondary | ICD-10-CM | POA: Diagnosis not present

## 2019-01-18 DIAGNOSIS — R609 Edema, unspecified: Secondary | ICD-10-CM | POA: Diagnosis not present

## 2019-01-18 DIAGNOSIS — R109 Unspecified abdominal pain: Secondary | ICD-10-CM | POA: Diagnosis not present

## 2019-01-18 DIAGNOSIS — N179 Acute kidney failure, unspecified: Secondary | ICD-10-CM | POA: Diagnosis not present

## 2019-01-18 DIAGNOSIS — R110 Nausea: Secondary | ICD-10-CM | POA: Diagnosis not present

## 2019-01-18 DIAGNOSIS — R197 Diarrhea, unspecified: Secondary | ICD-10-CM | POA: Diagnosis not present

## 2019-01-18 DIAGNOSIS — R935 Abnormal findings on diagnostic imaging of other abdominal regions, including retroperitoneum: Secondary | ICD-10-CM | POA: Diagnosis not present

## 2019-01-18 DIAGNOSIS — Z09 Encounter for follow-up examination after completed treatment for conditions other than malignant neoplasm: Secondary | ICD-10-CM | POA: Diagnosis not present

## 2019-01-18 DIAGNOSIS — E1169 Type 2 diabetes mellitus with other specified complication: Secondary | ICD-10-CM | POA: Diagnosis not present

## 2019-01-18 DIAGNOSIS — R7989 Other specified abnormal findings of blood chemistry: Secondary | ICD-10-CM | POA: Diagnosis not present

## 2019-01-24 ENCOUNTER — Other Ambulatory Visit: Payer: Self-pay | Admitting: Family Medicine

## 2019-01-24 DIAGNOSIS — R935 Abnormal findings on diagnostic imaging of other abdominal regions, including retroperitoneum: Secondary | ICD-10-CM

## 2019-01-26 DIAGNOSIS — Z0000 Encounter for general adult medical examination without abnormal findings: Secondary | ICD-10-CM | POA: Diagnosis not present

## 2019-01-26 DIAGNOSIS — M179 Osteoarthritis of knee, unspecified: Secondary | ICD-10-CM | POA: Diagnosis not present

## 2019-01-26 DIAGNOSIS — I10 Essential (primary) hypertension: Secondary | ICD-10-CM | POA: Diagnosis not present

## 2019-01-26 DIAGNOSIS — E1165 Type 2 diabetes mellitus with hyperglycemia: Secondary | ICD-10-CM | POA: Diagnosis not present

## 2019-01-31 DIAGNOSIS — M71342 Other bursal cyst, left hand: Secondary | ICD-10-CM | POA: Diagnosis not present

## 2019-01-31 DIAGNOSIS — M67432 Ganglion, left wrist: Secondary | ICD-10-CM | POA: Diagnosis not present

## 2019-01-31 DIAGNOSIS — D2112 Benign neoplasm of connective and other soft tissue of left upper limb, including shoulder: Secondary | ICD-10-CM | POA: Diagnosis not present

## 2019-01-31 DIAGNOSIS — M25742 Osteophyte, left hand: Secondary | ICD-10-CM | POA: Diagnosis not present

## 2019-01-31 DIAGNOSIS — M67442 Ganglion, left hand: Secondary | ICD-10-CM | POA: Diagnosis not present

## 2019-01-31 DIAGNOSIS — D481 Neoplasm of uncertain behavior of connective and other soft tissue: Secondary | ICD-10-CM | POA: Diagnosis not present

## 2019-02-08 ENCOUNTER — Ambulatory Visit
Admission: RE | Admit: 2019-02-08 | Discharge: 2019-02-08 | Disposition: A | Discharge status: Home / Self Care | Payer: Medicare HMO | Source: Ambulatory Visit | Attending: Family Medicine | Admitting: Family Medicine

## 2019-02-08 ENCOUNTER — Other Ambulatory Visit: Payer: Self-pay

## 2019-02-08 DIAGNOSIS — K760 Fatty (change of) liver, not elsewhere classified: Secondary | ICD-10-CM | POA: Diagnosis not present

## 2019-02-08 DIAGNOSIS — R935 Abnormal findings on diagnostic imaging of other abdominal regions, including retroperitoneum: Secondary | ICD-10-CM

## 2019-02-08 DIAGNOSIS — E278 Other specified disorders of adrenal gland: Secondary | ICD-10-CM | POA: Diagnosis not present

## 2019-02-08 MED ORDER — GADOBENATE DIMEGLUMINE 529 MG/ML IV SOLN
20.0000 mL | Freq: Once | INTRAVENOUS | Status: AC | PRN
  Administered 2019-02-08: 20 mL via INTRAVENOUS

## 2019-02-09 DIAGNOSIS — Z23 Encounter for immunization: Secondary | ICD-10-CM | POA: Diagnosis not present

## 2019-02-15 ENCOUNTER — Other Ambulatory Visit: Payer: Self-pay

## 2019-02-15 ENCOUNTER — Ambulatory Visit: Payer: Medicare HMO | Admitting: Podiatry

## 2019-02-15 ENCOUNTER — Ambulatory Visit (INDEPENDENT_AMBULATORY_CARE_PROVIDER_SITE_OTHER): Payer: Medicare HMO

## 2019-02-15 ENCOUNTER — Encounter: Payer: Self-pay | Admitting: Podiatry

## 2019-02-15 DIAGNOSIS — M2041 Other hammer toe(s) (acquired), right foot: Secondary | ICD-10-CM | POA: Diagnosis not present

## 2019-02-15 DIAGNOSIS — M79675 Pain in left toe(s): Secondary | ICD-10-CM | POA: Diagnosis not present

## 2019-02-15 DIAGNOSIS — M79642 Pain in left hand: Secondary | ICD-10-CM | POA: Diagnosis not present

## 2019-02-15 DIAGNOSIS — M2012 Hallux valgus (acquired), left foot: Secondary | ICD-10-CM

## 2019-02-15 DIAGNOSIS — M79674 Pain in right toe(s): Secondary | ICD-10-CM

## 2019-02-15 DIAGNOSIS — M2011 Hallux valgus (acquired), right foot: Secondary | ICD-10-CM

## 2019-02-15 DIAGNOSIS — B351 Tinea unguium: Secondary | ICD-10-CM | POA: Diagnosis not present

## 2019-02-15 DIAGNOSIS — L84 Corns and callosities: Secondary | ICD-10-CM

## 2019-02-15 DIAGNOSIS — Z5189 Encounter for other specified aftercare: Secondary | ICD-10-CM | POA: Diagnosis not present

## 2019-02-15 DIAGNOSIS — M2042 Other hammer toe(s) (acquired), left foot: Secondary | ICD-10-CM

## 2019-02-15 DIAGNOSIS — M25842 Other specified joint disorders, left hand: Secondary | ICD-10-CM | POA: Diagnosis not present

## 2019-02-15 DIAGNOSIS — E119 Type 2 diabetes mellitus without complications: Secondary | ICD-10-CM

## 2019-02-15 DIAGNOSIS — M25832 Other specified joint disorders, left wrist: Secondary | ICD-10-CM | POA: Insufficient documentation

## 2019-02-15 NOTE — Patient Instructions (Signed)
Bunion  A bunion is a bump on the base of the big toe that forms when the bones of the big toe joint move out of position. Bunions may be small at first, but they often get larger over time. They can make walking painful. What are the causes? A bunion may be caused by:  Wearing narrow or pointed shoes that force the big toe to press against the other toes.  Abnormal foot development that causes the foot to roll inward (pronate).  Changes in the foot that are caused by certain diseases, such as rheumatoid arthritis or polio.  A foot injury. What increases the risk? The following factors may make you more likely to develop this condition:  Wearing shoes that squeeze the toes together.  Having certain diseases, such as: ? Rheumatoid arthritis. ? Polio. ? Cerebral palsy.  Having family members who have bunions.  Being born with a foot deformity, such as flat feet or low arches.  Doing activities that put a lot of pressure on the feet, such as ballet dancing. What are the signs or symptoms? The main symptom of a bunion is a noticeable bump on the big toe. Other symptoms may include:  Pain.  Swelling around the big toe.  Redness and inflammation.  Thick or hardened skin on the big toe or between the toes.  Stiffness or loss of motion in the big toe.  Trouble with walking. How is this diagnosed? A bunion may be diagnosed based on your symptoms, medical history, and activities. You may have tests, such as:  X-rays. These allow your health care provider to check the position of the bones in your foot and look for damage to your joint. They also help your health care provider determine the severity of your bunion and the best way to treat it.  Joint aspiration. In this test, a sample of fluid is removed from the toe joint. This test may be done if you are in a lot of pain. It helps rule out diseases that cause painful swelling of the joints, such as arthritis. How is this  treated? Treatment depends on the severity of your symptoms. The goal of treatment is to relieve symptoms and prevent the bunion from getting worse. Your health care provider may recommend:  Wearing shoes that have a wide toe box.  Using bunion pads to cushion the affected area.  Taping your toes together to keep them in a normal position.  Placing a device inside your shoe (orthotics) to help reduce pressure on your toe joint.  Taking medicine to ease pain, inflammation, and swelling.  Applying heat or ice to the affected area.  Doing stretching exercises.  Surgery to remove scar tissue and move the toes back into their normal position. This treatment is rare. Follow these instructions at home: Managing pain, stiffness, and swelling   If directed, put ice on the painful area: ? Put ice in a plastic bag. ? Place a towel between your skin and the bag. ? Leave the ice on for 20 minutes, 2-3 times a day. Activity   If directed, apply heat to the affected area before you exercise. Use the heat source that your health care provider recommends, such as a moist heat pack or a heating pad. ? Place a towel between your skin and the heat source. ? Leave the heat on for 20-30 minutes. ? Remove the heat if your skin turns bright red. This is especially important if you are unable to feel pain,   heat, or cold. You may have a greater risk of getting burned.  Do exercises as told by your health care provider. General instructions  Support your toe joint with proper footwear, shoe padding, or taping as told by your health care provider.  Take over-the-counter and prescription medicines only as told by your health care provider.  Keep all follow-up visits as told by your health care provider. This is important. Contact a health care provider if your symptoms:  Get worse.  Do not improve in 2 weeks. Get help right away if you have:  Severe pain and trouble with walking. Summary  A  bunion is a bump on the base of the big toe that forms when the bones of the big toe joint move out of position.  Bunions can make walking painful.  Treatment depends on the severity of your symptoms.  Support your toe joint with proper footwear, shoe padding, or taping as told by your health care provider. This information is not intended to replace advice given to you by your health care provider. Make sure you discuss any questions you have with your health care provider. Document Released: 04/20/2005 Document Revised: 10/25/2017 Document Reviewed: 08/31/2017 Elsevier Patient Education  Hurley Toe  Hammer toe is a change in the shape (a deformity) of your toe. The deformity causes the middle joint of your toe to stay bent. This causes pain, especially when you are wearing shoes. Hammer toe starts gradually. At first, the toe can be straightened. Gradually over time, the deformity becomes stiff and permanent. Early treatments to keep the toe straight may relieve pain. As the deformity becomes stiff and permanent, surgery may be needed to straighten the toe. What are the causes? Hammer toe is caused by abnormal bending of the toe joint that is closest to your foot. It happens gradually over time. This pulls on the muscles and connections (tendons) of the toe joint, making them weak and stiff. It is often related to wearing shoes that are too short or narrow and do not let your toes straighten. What increases the risk? You may be at greater risk for hammer toe if you:  Are female.  Are older.  Wear shoes that are too small.  Wear high-heeled shoes that pinch your toes.  Are a Engineer, mining.  Have a second toe that is longer than your big toe (first toe).  Injure your foot or toe.  Have arthritis.  Have a family history of hammer toe.  Have a nerve or muscle disorder. What are the signs or symptoms? The main symptoms of this condition are pain and  deformity of the toe. The pain is worse when wearing shoes, walking, or running. Other symptoms may include:  Corns or calluses over the bent part of the toe or between the toes.  Redness and a burning feeling on the toe.  An open sore that forms on the top of the toe.  Not being able to straighten the toe. How is this diagnosed? This condition is diagnosed based on your symptoms and a physical exam. During the exam, your health care provider will try to straighten your toe to see how stiff the deformity is. You may also have tests, such as:  A blood test to check for rheumatoid arthritis.  An X-ray to show how severe the deformity is. How is this treated? Treatment for this condition will depend on how stiff the deformity is. Surgery is often needed. However,  sometimes a hammer toe can be straightened without surgery. Treatments that do not involve surgery include:  Taping the toe into a straightened position.  Using pads and cushions to protect the toe (orthotics).  Wearing shoes that provide enough room for the toes.  Doing toe-stretching exercises at home.  Taking an NSAID to reduce pain and swelling. If these treatments do not help or the toe cannot be straightened, surgery is the next option. The most common surgeries used to straighten a hammer toe include:  Arthroplasty. In this procedure, part of the joint is removed, and that allows the toe to straighten.  Fusion. In this procedure, cartilage between the two bones of the joint is taken out and the bones are fused together into one longer bone.  Implantation. In this procedure, part of the bone is removed and replaced with an implant to let the toe move again.  Flexor tendon transfer. In this procedure, the tendons that curl the toes down (flexor tendons) are repositioned. Follow these instructions at home:  Take over-the-counter and prescription medicines only as told by your health care provider.  Do toe  straightening and stretching exercises as told by your health care provider.  Keep all follow-up visits as told by your health care provider. This is important. How is this prevented?  Wear shoes that give your toes enough room and do not cause pain.  Do not wear high-heeled shoes. Contact a health care provider if:  Your pain gets worse.  Your toe becomes red or swollen.  You develop an open sore on your toe. This information is not intended to replace advice given to you by your health care provider. Make sure you discuss any questions you have with your health care provider. Document Released: 04/17/2000 Document Revised: 04/02/2017 Document Reviewed: 08/14/2015 Elsevier Patient Education  2020 [employer].

## 2019-02-15 NOTE — Progress Notes (Signed)
Subjective:   Patient ID: Christina [name], female   DOB: 67 y.o.   MRN: [id number]   HPI Patient presents stating she is concerned about digital deformity of the right big toe second toe with bunion deformity and also has keratotic lesions on the big toes on both feet that become sore and her nails get thick and are very difficult for her to cut.  Patient does not smoke likes to be active   Review of Systems  All other systems reviewed and are negative.       Objective:  Physical Exam Vitals signs and nursing note reviewed.  Constitutional:      Appearance: She is well-developed.  Pulmonary:     Effort: Pulmonary effort is normal.  Musculoskeletal: Normal range of motion.  Skin:    General: Skin is warm.  Neurological:     Mental Status: She is alert.     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

## 2019-02-20 DIAGNOSIS — M79642 Pain in left hand: Secondary | ICD-10-CM | POA: Diagnosis not present

## 2019-02-21 DIAGNOSIS — K429 Umbilical hernia without obstruction or gangrene: Secondary | ICD-10-CM | POA: Diagnosis not present

## 2019-03-22 ENCOUNTER — Encounter: Payer: Medicare HMO | Admitting: Obstetrics & Gynecology

## 2019-05-24 ENCOUNTER — Other Ambulatory Visit: Payer: Self-pay

## 2019-05-24 ENCOUNTER — Ambulatory Visit: Payer: Medicare HMO | Admitting: Podiatry

## 2019-05-24 DIAGNOSIS — L84 Corns and callosities: Secondary | ICD-10-CM | POA: Diagnosis not present

## 2019-05-24 DIAGNOSIS — M79675 Pain in left toe(s): Secondary | ICD-10-CM

## 2019-05-24 DIAGNOSIS — M79674 Pain in right toe(s): Secondary | ICD-10-CM

## 2019-05-24 DIAGNOSIS — B351 Tinea unguium: Secondary | ICD-10-CM

## 2019-05-24 DIAGNOSIS — E119 Type 2 diabetes mellitus without complications: Secondary | ICD-10-CM

## 2019-05-24 NOTE — Patient Instructions (Addendum)
[email or web address] (recovery).  Apply Foot Miracle and use pumice stone every 3 weeks.     Diabetes Mellitus and Foot Care Foot care is an important part of your health, especially when you have diabetes. Diabetes may cause you to have problems because of poor blood flow (circulation) to your feet and legs, which can cause your skin to:  Become thinner and drier.  Break more easily.  Heal more slowly.  Peel and crack. You may also have nerve damage (neuropathy) in your legs and feet, causing decreased feeling in them. This means that you may not notice minor injuries to your feet that could lead to more serious problems. Noticing and addressing any potential problems early is the best way to prevent future foot problems. How to care for your feet Foot hygiene  Wash your feet daily with warm water and mild soap. Do not use hot water. Then, pat your feet and the areas between your toes until they are completely dry. Do not soak your feet as this can dry your skin.  Trim your toenails straight across. Do not dig under them or around the cuticle. File the edges of your nails with an emery board or nail file.  Apply a moisturizing lotion or petroleum jelly to the skin on your feet and to dry, brittle toenails. Use lotion that does not contain alcohol and is unscented. Do not apply lotion between your toes. Shoes and socks  Wear clean socks or stockings every day. Make sure they are not too tight. Do not wear knee-high stockings since they may decrease blood flow to your legs.  Wear shoes that fit properly and have enough cushioning. Always look in your shoes before you put them on to be sure there are no objects inside.  To break in new shoes, wear them for just a few hours a day. This prevents injuries on your feet. Wounds, scrapes, corns, and calluses  Check your feet daily for blisters, cuts, bruises, sores, and redness. If you cannot see the bottom of your feet, use a mirror or ask someone  for help.  Do not cut corns or calluses or try to remove them with medicine.  If you find a minor scrape, cut, or break in the skin on your feet, keep it and the skin around it clean and dry. You may clean these areas with mild soap and water. Do not clean the area with peroxide, alcohol, or iodine.  If you have a wound, scrape, corn, or callus on your foot, look at it several times a day to make sure it is healing and not infected. Check for: ? Redness, swelling, or pain. ? Fluid or blood. ? Warmth. ? Pus or a bad smell. General instructions  Do not cross your legs. This may decrease blood flow to your feet.  Do not use heating pads or hot water bottles on your feet. They may burn your skin. If you have lost feeling in your feet or legs, you may not know this is happening until it is too late.  Protect your feet from hot and cold by wearing shoes, such as at the beach or on hot pavement.  Schedule a complete foot exam at least once a year (annually) or more often if you have foot problems. If you have foot problems, report any cuts, sores, or bruises to your health care provider immediately. Contact a health care provider if:  You have a medical condition that increases your risk of infection and you  have any cuts, sores, or bruises on your feet.  You have an injury that is not healing.  You have redness on your legs or feet.  You feel burning or tingling in your legs or feet.  You have pain or cramps in your legs and feet.  Your legs or feet are numb.  Your feet always feel cold.  You have pain around a toenail. Get help right away if:  You have a wound, scrape, corn, or callus on your foot and: ? You have pain, swelling, or redness that gets worse. ? You have fluid or blood coming from the wound, scrape, corn, or callus. ? Your wound, scrape, corn, or callus feels warm to the touch. ? You have pus or a bad smell coming from the wound, scrape, corn, or callus. ? You have  a fever. ? You have a red line going up your leg. Summary  Check your feet every day for cuts, sores, red spots, swelling, and blisters.  Moisturize feet and legs daily.  Wear shoes that fit properly and have enough cushioning.  If you have foot problems, report any cuts, sores, or bruises to your health care provider immediately.  Schedule a complete foot exam at least once a year (annually) or more often if you have foot problems. This information is not intended to replace advice given to you by your health care provider. Make sure you discuss any questions you have with your health care provider. Document Revised: 01/11/2019 Document Reviewed: 05/22/2016 Elsevier Patient Education  [place].

## 2019-05-28 ENCOUNTER — Encounter: Payer: Self-pay | Admitting: Podiatry

## 2019-05-28 NOTE — Progress Notes (Signed)
Subjective: Christina [name] presents today for follow up of preventative diabetic foot care with and callus(es) b/l feet and painful mycotic toenails b/l that are difficult to trim. Pain interferes with ambulation. Aggravating factors include wearing enclosed shoe gear. Pain is relieved with periodic professional debridement..    She states she has h/o lipoma resection medial aspect right heel pad which has recurred. It is not symptomatic.  Allergies  Allergen Reactions  . Atorvastatin     Other reaction(s): Cramps (ALLERGY/intolerance), Other  . Penicillins Dermatitis and Rash    Skin and nails peel   . Rosuvastatin     Other reaction(s): Cramps (ALLERGY/intolerance)  . Rosuvastatin Calcium      Objective: There were no vitals filed for this visit.  Vascular Examination:  capillary refill time to digits immediate b/l, palpable DP pulses b/l, palpable PT pulses b/l, pedal hair sparse b/l and skin temperature gradient within normal limits b/l  Dermatological Examination: Pedal skin with normal turgor, texture and tone bilaterally, no open wounds bilaterally, no interdigital macerations bilaterally, toenails 1-5 b/l elongated, dystrophic, thickened, crumbly with subungual debris and hyperkeratotic lesion(s) submetatarsal head 5 b/l.  No erythema, no edema, no drainage, no flocculence  Musculoskeletal: normal muscle strength 5/5 to all lower extremity muscle groups bilaterally, no pain crepitus or joint limitation noted with ROM b/l, bunion deformity noted b/l, hammertoes noted to the right, 2nd toe and utilizes cane for ambulation assistance   Palpable fixed soft tissue mass noted medial heelpad right foot.  Neurological: sensation intact 5/5 intact bilaterally with 10g monofilament and vibratory sensation intact  Assessment: 1. Pain due to onychomycosis of toenails of both feet   2. Callus   3. Controlled type 2 diabetes mellitus without complication, without long-term current use of  insulin ([place])     Plan: Continue diabetic foot care principles. Literature dispensed on today.  -Toenails 1-5 b/l were debrided in length and girth without iatrogenic bleeding. -The above corns and calluses were debrided without complication or incident. Total number debrided =2 submet head 5 b/l. She was advised to use Foot Miracle cream and pumice stone after baths once every 3 weeks. Do not use any sharp instruments on her feet. -Patient to continue soft, supportive shoe gear daily. -Patient to report any pedal injuries to medical professional immediately. -Patient/POA to call should there be question/concern in the interim.  Return in about 10 weeks (around 08/02/2019) for diabetic nail and callus trim.

## 2019-06-12 DIAGNOSIS — M8580 Other specified disorders of bone density and structure, unspecified site: Secondary | ICD-10-CM | POA: Diagnosis not present

## 2019-06-12 DIAGNOSIS — I10 Essential (primary) hypertension: Secondary | ICD-10-CM | POA: Diagnosis not present

## 2019-06-12 DIAGNOSIS — E7800 Pure hypercholesterolemia, unspecified: Secondary | ICD-10-CM | POA: Diagnosis not present

## 2019-06-12 DIAGNOSIS — E1169 Type 2 diabetes mellitus with other specified complication: Secondary | ICD-10-CM | POA: Diagnosis not present

## 2019-06-12 DIAGNOSIS — Z85038 Personal history of other malignant neoplasm of large intestine: Secondary | ICD-10-CM | POA: Diagnosis not present

## 2019-06-12 DIAGNOSIS — M179 Osteoarthritis of knee, unspecified: Secondary | ICD-10-CM | POA: Diagnosis not present

## 2019-06-12 DIAGNOSIS — E1165 Type 2 diabetes mellitus with hyperglycemia: Secondary | ICD-10-CM | POA: Diagnosis not present

## 2019-07-20 DIAGNOSIS — E7800 Pure hypercholesterolemia, unspecified: Secondary | ICD-10-CM | POA: Diagnosis not present

## 2019-07-20 DIAGNOSIS — I10 Essential (primary) hypertension: Secondary | ICD-10-CM | POA: Diagnosis not present

## 2019-07-20 DIAGNOSIS — Z85038 Personal history of other malignant neoplasm of large intestine: Secondary | ICD-10-CM | POA: Diagnosis not present

## 2019-07-20 DIAGNOSIS — N183 Chronic kidney disease, stage 3 unspecified: Secondary | ICD-10-CM | POA: Diagnosis not present

## 2019-07-20 DIAGNOSIS — M179 Osteoarthritis of knee, unspecified: Secondary | ICD-10-CM | POA: Diagnosis not present

## 2019-07-20 DIAGNOSIS — E1169 Type 2 diabetes mellitus with other specified complication: Secondary | ICD-10-CM | POA: Diagnosis not present

## 2019-07-20 DIAGNOSIS — M8580 Other specified disorders of bone density and structure, unspecified site: Secondary | ICD-10-CM | POA: Diagnosis not present

## 2019-07-28 DIAGNOSIS — E7800 Pure hypercholesterolemia, unspecified: Secondary | ICD-10-CM | POA: Diagnosis not present

## 2019-07-28 DIAGNOSIS — M7500 Adhesive capsulitis of unspecified shoulder: Secondary | ICD-10-CM | POA: Diagnosis not present

## 2019-07-28 DIAGNOSIS — E1169 Type 2 diabetes mellitus with other specified complication: Secondary | ICD-10-CM | POA: Diagnosis not present

## 2019-07-28 DIAGNOSIS — N183 Chronic kidney disease, stage 3 unspecified: Secondary | ICD-10-CM | POA: Diagnosis not present

## 2019-08-02 ENCOUNTER — Other Ambulatory Visit: Payer: Self-pay

## 2019-08-02 ENCOUNTER — Encounter: Payer: Self-pay | Admitting: Podiatry

## 2019-08-02 ENCOUNTER — Ambulatory Visit: Payer: Medicare HMO | Admitting: Podiatry

## 2019-08-02 VITALS — Temp 97.6°F

## 2019-08-02 DIAGNOSIS — M79674 Pain in right toe(s): Secondary | ICD-10-CM

## 2019-08-02 DIAGNOSIS — E119 Type 2 diabetes mellitus without complications: Secondary | ICD-10-CM | POA: Diagnosis not present

## 2019-08-02 DIAGNOSIS — L84 Corns and callosities: Secondary | ICD-10-CM

## 2019-08-02 DIAGNOSIS — M79675 Pain in left toe(s): Secondary | ICD-10-CM

## 2019-08-02 DIAGNOSIS — B351 Tinea unguium: Secondary | ICD-10-CM

## 2019-08-02 NOTE — Patient Instructions (Signed)
Diabetes Mellitus and Foot Care Foot care is an important part of your health, especially when you have diabetes. Diabetes may cause you to have problems because of poor blood flow (circulation) to your feet and legs, which can cause your skin to:  Become thinner and drier.  Break more easily.  Heal more slowly.  Peel and crack. You may also have nerve damage (neuropathy) in your legs and feet, causing decreased feeling in them. This means that you may not notice minor injuries to your feet that could lead to more serious problems. Noticing and addressing any potential problems early is the best way to prevent future foot problems. How to care for your feet Foot hygiene  Wash your feet daily with warm water and mild soap. Do not use hot water. Then, pat your feet and the areas between your toes until they are completely dry. Do not soak your feet as this can dry your skin.  Trim your toenails straight across. Do not dig under them or around the cuticle. File the edges of your nails with an emery board or nail file.  Apply a moisturizing lotion or petroleum jelly to the skin on your feet and to dry, brittle toenails. Use lotion that does not contain alcohol and is unscented. Do not apply lotion between your toes. Shoes and socks  Wear clean socks or stockings every day. Make sure they are not too tight. Do not wear knee-high stockings since they may decrease blood flow to your legs.  Wear shoes that fit properly and have enough cushioning. Always look in your shoes before you put them on to be sure there are no objects inside.  To break in new shoes, wear them for just a few hours a day. This prevents injuries on your feet. Wounds, scrapes, corns, and calluses  Check your feet daily for blisters, cuts, bruises, sores, and redness. If you cannot see the bottom of your feet, use a mirror or ask someone for help.  Do not cut corns or calluses or try to remove them with medicine.  If you  find a minor scrape, cut, or break in the skin on your feet, keep it and the skin around it clean and dry. You may clean these areas with mild soap and water. Do not clean the area with peroxide, alcohol, or iodine.  If you have a wound, scrape, corn, or callus on your foot, look at it several times a day to make sure it is healing and not infected. Check for: ? Redness, swelling, or pain. ? Fluid or blood. ? Warmth. ? Pus or a bad smell. General instructions  Do not cross your legs. This may decrease blood flow to your feet.  Do not use heating pads or hot water bottles on your feet. They may burn your skin. If you have lost feeling in your feet or legs, you may not know this is happening until it is too late.  Protect your feet from hot and cold by wearing shoes, such as at the beach or on hot pavement.  Schedule a complete foot exam at least once a year (annually) or more often if you have foot problems. If you have foot problems, report any cuts, sores, or bruises to your health care provider immediately. Contact a health care provider if:  You have a medical condition that increases your risk of infection and you have any cuts, sores, or bruises on your feet.  You have an injury that is not   healing.  You have redness on your legs or feet.  You feel burning or tingling in your legs or feet.  You have pain or cramps in your legs and feet.  Your legs or feet are numb.  Your feet always feel cold.  You have pain around a toenail. Get help right away if:  You have a wound, scrape, corn, or callus on your foot and: ? You have pain, swelling, or redness that gets worse. ? You have fluid or blood coming from the wound, scrape, corn, or callus. ? Your wound, scrape, corn, or callus feels warm to the touch. ? You have pus or a bad smell coming from the wound, scrape, corn, or callus. ? You have a fever. ? You have a red line going up your leg. Summary  Check your feet every day  for cuts, sores, red spots, swelling, and blisters.  Moisturize feet and legs daily.  Wear shoes that fit properly and have enough cushioning.  If you have foot problems, report any cuts, sores, or bruises to your health care provider immediately.  Schedule a complete foot exam at least once a year (annually) or more often if you have foot problems. This information is not intended to replace advice given to you by your health care provider. Make sure you discuss any questions you have with your health care provider. Document Revised: 01/11/2019 Document Reviewed: 05/22/2016 Elsevier Patient Education  2020 [employer]  

## 2019-08-03 NOTE — Progress Notes (Signed)
Subjective: Christina [name] presents today for follow up of preventative diabetic foot care and callus(es) b/l feet and painful mycotic toenails b/l that are difficult to trim. Pain interferes with ambulation. Aggravating factors include wearing enclosed shoe gear. Pain is relieved with periodic professional debridement.   Today, she has concerns regarding a balance on her account.   Allergies  Allergen Reactions  . Atorvastatin     Other reaction(s): Cramps (ALLERGY/intolerance), Other  . Penicillins Dermatitis and Rash    Skin and nails peel   . Rosuvastatin     Other reaction(s): Cramps (ALLERGY/intolerance)  . Rosuvastatin Calcium      Objective: Vitals:   08/02/19 1138  Temp: 97.6 F (36.4 C)    Pt 69 y.o. year old AA  female in NAD. AAO x 3.   Vascular Examination:  Capillary refill time to digits immediate b/l. Palpable DP pulses b/l. Palpable PT pulses b/l. Pedal hair sparse b/l. Skin temperature gradient within normal limits b/l.  Dermatological Examination: Pedal skin with normal turgor, texture and tone bilaterally. No open wounds bilaterally. No interdigital macerations bilaterally. Toenails 1-5 b/l elongated, dystrophic, thickened, crumbly with subungual debris and tenderness to dorsal palpation. Hyperkeratotic lesion(s) submet head 5 left foot and submet head 5 right foot.  No erythema, no edema, no drainage, no flocculence.  Musculoskeletal: Normal muscle strength 5/5 to all lower extremity muscle groups bilaterally, no pain crepitus or joint limitation noted with ROM b/l, bunion deformity noted b/l and hammertoes noted to the  R 2nd toe  Neurological: Protective sensation intact 5/5 intact bilaterally with 10g monofilament b/l Vibratory sensation intact b/l  Assessment: 1. Pain due to onychomycosis of toenails of both feet   2. Callus   3. Controlled type 2 diabetes mellitus without complication, without long-term current use of insulin ([place])    Plan: -Continue  diabetic foot care principles. Literature dispensed on today.  -Toenails 1-5 b/l were debrided in length and girth with sterile nail nippers and dremel without iatrogenic bleeding.  -Callus(es) submet head 5 left foot and submet head 5 right foot were debrided without complication or incident. Total number debrided =2. -Patient to continue soft, supportive shoe gear daily. -Patient to report any pedal injuries to medical professional immediately. -Patient/POA to call should there be question/concern in the interim.  Return in about 3 months (around 11/01/2019) for diabetic nail trim.

## 2019-08-04 DIAGNOSIS — E7800 Pure hypercholesterolemia, unspecified: Secondary | ICD-10-CM | POA: Diagnosis not present

## 2019-08-04 DIAGNOSIS — Z85038 Personal history of other malignant neoplasm of large intestine: Secondary | ICD-10-CM | POA: Diagnosis not present

## 2019-08-04 DIAGNOSIS — M8580 Other specified disorders of bone density and structure, unspecified site: Secondary | ICD-10-CM | POA: Diagnosis not present

## 2019-08-04 DIAGNOSIS — M179 Osteoarthritis of knee, unspecified: Secondary | ICD-10-CM | POA: Diagnosis not present

## 2019-08-04 DIAGNOSIS — E1169 Type 2 diabetes mellitus with other specified complication: Secondary | ICD-10-CM | POA: Diagnosis not present

## 2019-08-04 DIAGNOSIS — N183 Chronic kidney disease, stage 3 unspecified: Secondary | ICD-10-CM | POA: Diagnosis not present

## 2019-08-04 DIAGNOSIS — I10 Essential (primary) hypertension: Secondary | ICD-10-CM | POA: Diagnosis not present

## 2019-08-08 DIAGNOSIS — M7502 Adhesive capsulitis of left shoulder: Secondary | ICD-10-CM | POA: Diagnosis not present

## 2019-08-10 ENCOUNTER — Telehealth: Payer: Self-pay | Admitting: Podiatry

## 2019-08-10 NOTE — Telephone Encounter (Signed)
My bill was supposed to be refilled and I wanted to know the status of that. My phone number is [phone]. Thank you very much for returning the call.

## 2019-08-14 DIAGNOSIS — M7502 Adhesive capsulitis of left shoulder: Secondary | ICD-10-CM | POA: Diagnosis not present

## 2019-08-18 DIAGNOSIS — M7502 Adhesive capsulitis of left shoulder: Secondary | ICD-10-CM | POA: Diagnosis not present

## 2019-08-21 DIAGNOSIS — M7502 Adhesive capsulitis of left shoulder: Secondary | ICD-10-CM | POA: Diagnosis not present

## 2019-08-25 DIAGNOSIS — M7502 Adhesive capsulitis of left shoulder: Secondary | ICD-10-CM | POA: Diagnosis not present

## 2019-08-30 DIAGNOSIS — M7502 Adhesive capsulitis of left shoulder: Secondary | ICD-10-CM | POA: Diagnosis not present

## 2019-09-01 DIAGNOSIS — M7502 Adhesive capsulitis of left shoulder: Secondary | ICD-10-CM | POA: Diagnosis not present

## 2019-09-05 DIAGNOSIS — M7502 Adhesive capsulitis of left shoulder: Secondary | ICD-10-CM | POA: Diagnosis not present

## 2019-09-17 DIAGNOSIS — E7800 Pure hypercholesterolemia, unspecified: Secondary | ICD-10-CM | POA: Diagnosis not present

## 2019-09-17 DIAGNOSIS — M8580 Other specified disorders of bone density and structure, unspecified site: Secondary | ICD-10-CM | POA: Diagnosis not present

## 2019-09-17 DIAGNOSIS — M179 Osteoarthritis of knee, unspecified: Secondary | ICD-10-CM | POA: Diagnosis not present

## 2019-09-17 DIAGNOSIS — Z85038 Personal history of other malignant neoplasm of large intestine: Secondary | ICD-10-CM | POA: Diagnosis not present

## 2019-09-17 DIAGNOSIS — I10 Essential (primary) hypertension: Secondary | ICD-10-CM | POA: Diagnosis not present

## 2019-09-17 DIAGNOSIS — N183 Chronic kidney disease, stage 3 unspecified: Secondary | ICD-10-CM | POA: Diagnosis not present

## 2019-09-17 DIAGNOSIS — E1169 Type 2 diabetes mellitus with other specified complication: Secondary | ICD-10-CM | POA: Diagnosis not present

## 2019-11-01 ENCOUNTER — Other Ambulatory Visit: Payer: Self-pay

## 2019-11-01 ENCOUNTER — Encounter: Payer: Self-pay | Admitting: Podiatry

## 2019-11-01 ENCOUNTER — Ambulatory Visit: Payer: Medicare HMO | Admitting: Podiatry

## 2019-11-01 DIAGNOSIS — M79674 Pain in right toe(s): Secondary | ICD-10-CM

## 2019-11-01 DIAGNOSIS — L84 Corns and callosities: Secondary | ICD-10-CM

## 2019-11-01 DIAGNOSIS — M2042 Other hammer toe(s) (acquired), left foot: Secondary | ICD-10-CM

## 2019-11-01 DIAGNOSIS — E119 Type 2 diabetes mellitus without complications: Secondary | ICD-10-CM

## 2019-11-01 DIAGNOSIS — M2041 Other hammer toe(s) (acquired), right foot: Secondary | ICD-10-CM

## 2019-11-01 DIAGNOSIS — B351 Tinea unguium: Secondary | ICD-10-CM

## 2019-11-01 DIAGNOSIS — M79675 Pain in left toe(s): Secondary | ICD-10-CM

## 2019-11-05 NOTE — Progress Notes (Signed)
Subjective:  Patient ID: Christina [name], female    DOB: 1950/08/09,  MRN: [id number]  69 y.o. female presents with preventative diabetic foot care and painful corn(s) b/l 5th toes and callus(es) b/l feet and painful mycotic nails.  Pain interferes with ambulation. Aggravating factors include wearing enclosed shoe gear..    Review of Systems: Negative except as noted in the HPI. Denies N/V/F/Ch. Past Medical History:  Diagnosis Date   Arthritis    OA RIGHT KNEE AND PAIN   Cancer ([place]) 2011   HX OF COLON CANCER; S/P SURGERY AND DID NOT HAVE TO HAVE CHEMO OR RADIATION   Diabetes mellitus without complication ([facility])    Hypertension    Past Surgical History:  Procedure Laterality Date   BREAST SURGERY     RIGHT AND LEFT BREAST TUMORS REMOVED - BENIGN   C-SECTIONS X 2     COLON SURGERY     FATTY TUMOR REMOVED FROM RIGHT HEEL     TONSILLECTOMY     AT AGE OF 12   TOTAL KNEE ARTHROPLASTY Right 08/02/2013   Procedure: RIGHT TOTAL KNEE ARTHROPLASTY;  Surgeon: Tobi Bastos, MD;  Location: [facility];  Service: Orthopedics;  Laterality: Right;   Patient Active Problem List   Diagnosis Date Noted   Mass of joint of left hand 02/15/2019   Mass of joint of left wrist 02/15/2019   Abdominal pain, vomiting, and diarrhea 01/12/2019   Renal insufficiency 01/12/2019   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   Pain in thumb joint with movement, right 12/28/2018   Pain of left hand 12/28/2018   Degenerative scoliosis 05/23/2018   Degenerative spondylolisthesis 05/23/2018   Constipation 09/07/2013   Surgical wound infection 08/08/2013   Diabetes mellitus without complication ([place])    Hypertension    Osteoarthritis of right knee 08/02/2013   Total knee replacement status 08/02/2013   History of colon cancer 06/19/2010    Current Outpatient Medications:    ACCU-CHEK AVIVA PLUS test strip, , Disp: , Rfl:    Accu-Chek Softclix Lancets lancets, , Disp: , Rfl:     amLODipine-benazepril (LOTREL) 10-40 MG capsule, Take 1 capsule by mouth daily., Disp: , Rfl:    Blood Glucose Calibration (ACCU-CHEK AVIVA) SOLN, , Disp: , Rfl:    Blood Glucose Monitoring Suppl (ACCU-CHEK AVIVA PLUS) w/Device KIT, , Disp: , Rfl:    metFORMIN (GLUCOPHAGE) 500 MG tablet, Take 1 tablet (500 mg total) by mouth 2 (two) times daily with a meal., Disp: 60 tablet, Rfl: 11   metFORMIN (GLUCOPHAGE-XR) 500 MG 24 hr tablet, , Disp: , Rfl:    ondansetron (ZOFRAN) 4 MG tablet, Take 1 tablet (4 mg total) by mouth every 6 (six) hours as needed for nausea., Disp: 20 tablet, Rfl: 0   ondansetron (ZOFRAN-ODT) 8 MG disintegrating tablet, DISSOLVE 1 TABLET IN MOUTH EVERY 8 HOURS AS NEEDED FOR NAUSEA FOR 5 DAYS, Disp: , Rfl:    oxyCODONE (OXY IR/ROXICODONE) 5 MG immediate release tablet, oxycodone 5 mg tablet, Disp: , Rfl:    pravastatin (PRAVACHOL) 10 MG tablet, Take 10 mg by mouth once a week., Disp: , Rfl:  Allergies  Allergen Reactions   Atorvastatin     Other reaction(s): Cramps (ALLERGY/intolerance), Other   Penicillins Dermatitis and Rash    Skin and nails peel    Rosuvastatin     Other reaction(s): Cramps (ALLERGY/intolerance)   Rosuvastatin Calcium    Social History   Tobacco Use  Smoking Status Never Smoker  Smokeless Tobacco Never Used  Objective:  There were no vitals filed for this visit. Constitutional Patient is a pleasant 69 y.o. African American female, in NAD.[place]  Vascular Neurovascular status unchanged b/l lower extremities. Capillary refill time to digits immediate b/l. Palpable pedal pulses b/l LE. Pedal hair sparse. Lower extremity skin temperature gradient within normal limits. No pain with calf compression b/l. No cyanosis or clubbing noted.  Neurologic Normal speech. Oriented to person, place, and time. Protective sensation intact 5/5 intact bilaterally with 10g monofilament b/l. Vibratory sensation intact b/l.  Dermatologic Pedal skin with normal  turgor, texture and tone bilaterally. No open wounds bilaterally. No interdigital macerations bilaterally. Toenails 1-5 b/l elongated, discolored, dystrophic, thickened, crumbly with subungual debris and tenderness to dorsal palpation. Hyperkeratotic lesion(s) L 2nd toe, R hallux, R 5th toe, submet head 5 left foot and submet head 5 right foot.  No erythema, no edema, no drainage, no flocculence.  Orthopedic: Normal muscle strength 5/5 to all lower extremity muscle groups bilaterally. No pain crepitus or joint limitation noted with ROM b/l. No gross bony deformities bilaterally. Well healed surgical scar medial heel pad right foot secondary to lipoma removal.    Radiographs: None Assessment:   1. Pain due to onychomycosis of toenails of both feet   2. Controlled type 2 diabetes mellitus without complication, without long-term current use of insulin ([facility])   3. Hammer toes of both feet   4. Corns and callosities    Plan:  Patient was evaluated and treated and all questions answered.  Onychomycosis with pain -Nails palliatively debridement as below. -Educated on self-care  Procedure: Nail Debridement Rationale: Pain Type of Debridement: manual, sharp debridement. Instrumentation: Nail nipper, rotary burr. Number of Nails: 10  -Examined patient. -Continue diabetic foot care principles. -Toenails 1-5 b/l were debrided in length and girth with sterile nail nippers and dremel without iatrogenic bleeding.  -Corn(s) L 5th toe and R 5th toe and callus(es) R hallux, submet head 5 left foot and submet head 5 right foot were pared utilizing sterile scalpel blade without incident. Total number debrided =5.  Return in about 3 months (around 02/01/2020) for diabetic nail and callus trim.  Marzetta Board, DPM

## 2019-11-06 DIAGNOSIS — M8580 Other specified disorders of bone density and structure, unspecified site: Secondary | ICD-10-CM | POA: Diagnosis not present

## 2019-11-06 DIAGNOSIS — E7800 Pure hypercholesterolemia, unspecified: Secondary | ICD-10-CM | POA: Diagnosis not present

## 2019-11-06 DIAGNOSIS — E1169 Type 2 diabetes mellitus with other specified complication: Secondary | ICD-10-CM | POA: Diagnosis not present

## 2019-11-06 DIAGNOSIS — N183 Chronic kidney disease, stage 3 unspecified: Secondary | ICD-10-CM | POA: Diagnosis not present

## 2019-11-06 DIAGNOSIS — Z85038 Personal history of other malignant neoplasm of large intestine: Secondary | ICD-10-CM | POA: Diagnosis not present

## 2019-11-06 DIAGNOSIS — I10 Essential (primary) hypertension: Secondary | ICD-10-CM | POA: Diagnosis not present

## 2019-11-06 DIAGNOSIS — M179 Osteoarthritis of knee, unspecified: Secondary | ICD-10-CM | POA: Diagnosis not present

## 2020-01-16 DIAGNOSIS — H5203 Hypermetropia, bilateral: Secondary | ICD-10-CM | POA: Diagnosis not present

## 2020-01-16 DIAGNOSIS — H25013 Cortical age-related cataract, bilateral: Secondary | ICD-10-CM | POA: Diagnosis not present

## 2020-01-16 DIAGNOSIS — E1136 Type 2 diabetes mellitus with diabetic cataract: Secondary | ICD-10-CM | POA: Diagnosis not present

## 2020-01-16 DIAGNOSIS — H2513 Age-related nuclear cataract, bilateral: Secondary | ICD-10-CM | POA: Diagnosis not present

## 2020-01-16 DIAGNOSIS — H524 Presbyopia: Secondary | ICD-10-CM | POA: Diagnosis not present

## 2020-01-23 DIAGNOSIS — Z1231 Encounter for screening mammogram for malignant neoplasm of breast: Secondary | ICD-10-CM | POA: Diagnosis not present

## 2020-01-23 DIAGNOSIS — M85851 Other specified disorders of bone density and structure, right thigh: Secondary | ICD-10-CM | POA: Diagnosis not present

## 2020-01-23 DIAGNOSIS — M85852 Other specified disorders of bone density and structure, left thigh: Secondary | ICD-10-CM | POA: Diagnosis not present

## 2020-01-31 DIAGNOSIS — Z85038 Personal history of other malignant neoplasm of large intestine: Secondary | ICD-10-CM | POA: Diagnosis not present

## 2020-01-31 DIAGNOSIS — E1169 Type 2 diabetes mellitus with other specified complication: Secondary | ICD-10-CM | POA: Diagnosis not present

## 2020-01-31 DIAGNOSIS — E7800 Pure hypercholesterolemia, unspecified: Secondary | ICD-10-CM | POA: Diagnosis not present

## 2020-01-31 DIAGNOSIS — M8580 Other specified disorders of bone density and structure, unspecified site: Secondary | ICD-10-CM | POA: Diagnosis not present

## 2020-01-31 DIAGNOSIS — M179 Osteoarthritis of knee, unspecified: Secondary | ICD-10-CM | POA: Diagnosis not present

## 2020-01-31 DIAGNOSIS — I10 Essential (primary) hypertension: Secondary | ICD-10-CM | POA: Diagnosis not present

## 2020-01-31 DIAGNOSIS — N183 Chronic kidney disease, stage 3 unspecified: Secondary | ICD-10-CM | POA: Diagnosis not present

## 2020-02-07 ENCOUNTER — Ambulatory Visit: Payer: Medicare HMO | Admitting: Podiatry

## 2020-02-08 DIAGNOSIS — Z23 Encounter for immunization: Secondary | ICD-10-CM | POA: Diagnosis not present

## 2020-04-08 DIAGNOSIS — E7800 Pure hypercholesterolemia, unspecified: Secondary | ICD-10-CM | POA: Diagnosis not present

## 2020-04-08 DIAGNOSIS — G8929 Other chronic pain: Secondary | ICD-10-CM | POA: Diagnosis not present

## 2020-04-08 DIAGNOSIS — I10 Essential (primary) hypertension: Secondary | ICD-10-CM | POA: Diagnosis not present

## 2020-04-08 DIAGNOSIS — E1169 Type 2 diabetes mellitus with other specified complication: Secondary | ICD-10-CM | POA: Diagnosis not present

## 2020-04-08 DIAGNOSIS — M179 Osteoarthritis of knee, unspecified: Secondary | ICD-10-CM | POA: Diagnosis not present

## 2020-04-08 DIAGNOSIS — M8580 Other specified disorders of bone density and structure, unspecified site: Secondary | ICD-10-CM | POA: Diagnosis not present

## 2020-04-08 DIAGNOSIS — Z85038 Personal history of other malignant neoplasm of large intestine: Secondary | ICD-10-CM | POA: Diagnosis not present

## 2020-04-08 DIAGNOSIS — N183 Chronic kidney disease, stage 3 unspecified: Secondary | ICD-10-CM | POA: Diagnosis not present

## 2020-04-15 DIAGNOSIS — E1169 Type 2 diabetes mellitus with other specified complication: Secondary | ICD-10-CM | POA: Diagnosis not present

## 2020-04-15 DIAGNOSIS — Z0000 Encounter for general adult medical examination without abnormal findings: Secondary | ICD-10-CM | POA: Diagnosis not present

## 2020-04-15 DIAGNOSIS — E7800 Pure hypercholesterolemia, unspecified: Secondary | ICD-10-CM | POA: Diagnosis not present

## 2020-04-15 DIAGNOSIS — R3915 Urgency of urination: Secondary | ICD-10-CM | POA: Diagnosis not present

## 2020-04-15 DIAGNOSIS — M179 Osteoarthritis of knee, unspecified: Secondary | ICD-10-CM | POA: Diagnosis not present

## 2020-04-15 DIAGNOSIS — N183 Chronic kidney disease, stage 3 unspecified: Secondary | ICD-10-CM | POA: Diagnosis not present

## 2020-04-15 DIAGNOSIS — I10 Essential (primary) hypertension: Secondary | ICD-10-CM | POA: Diagnosis not present

## 2020-05-08 ENCOUNTER — Ambulatory Visit: Payer: Medicare HMO | Admitting: Podiatry

## 2020-05-08 ENCOUNTER — Other Ambulatory Visit: Payer: Self-pay

## 2020-05-08 ENCOUNTER — Encounter: Payer: Self-pay | Admitting: Podiatry

## 2020-05-08 DIAGNOSIS — R32 Unspecified urinary incontinence: Secondary | ICD-10-CM | POA: Insufficient documentation

## 2020-05-08 DIAGNOSIS — R3915 Urgency of urination: Secondary | ICD-10-CM | POA: Insufficient documentation

## 2020-05-08 DIAGNOSIS — M79674 Pain in right toe(s): Secondary | ICD-10-CM

## 2020-05-08 DIAGNOSIS — N1831 Chronic kidney disease, stage 3a: Secondary | ICD-10-CM | POA: Insufficient documentation

## 2020-05-08 DIAGNOSIS — M79675 Pain in left toe(s): Secondary | ICD-10-CM | POA: Diagnosis not present

## 2020-05-08 DIAGNOSIS — M2042 Other hammer toe(s) (acquired), left foot: Secondary | ICD-10-CM

## 2020-05-08 DIAGNOSIS — B351 Tinea unguium: Secondary | ICD-10-CM | POA: Diagnosis not present

## 2020-05-08 DIAGNOSIS — N183 Chronic kidney disease, stage 3 unspecified: Secondary | ICD-10-CM | POA: Insufficient documentation

## 2020-05-08 DIAGNOSIS — E119 Type 2 diabetes mellitus without complications: Secondary | ICD-10-CM

## 2020-05-08 DIAGNOSIS — G8929 Other chronic pain: Secondary | ICD-10-CM | POA: Insufficient documentation

## 2020-05-08 DIAGNOSIS — I700 Atherosclerosis of aorta: Secondary | ICD-10-CM | POA: Insufficient documentation

## 2020-05-08 DIAGNOSIS — G720 Drug-induced myopathy: Secondary | ICD-10-CM | POA: Insufficient documentation

## 2020-05-08 DIAGNOSIS — Z789 Other specified health status: Secondary | ICD-10-CM | POA: Insufficient documentation

## 2020-05-08 DIAGNOSIS — M2041 Other hammer toe(s) (acquired), right foot: Secondary | ICD-10-CM

## 2020-05-08 DIAGNOSIS — M5430 Sciatica, unspecified side: Secondary | ICD-10-CM | POA: Insufficient documentation

## 2020-05-08 DIAGNOSIS — L84 Corns and callosities: Secondary | ICD-10-CM

## 2020-05-08 DIAGNOSIS — E7800 Pure hypercholesterolemia, unspecified: Secondary | ICD-10-CM | POA: Insufficient documentation

## 2020-05-08 DIAGNOSIS — M791 Myalgia, unspecified site: Secondary | ICD-10-CM | POA: Insufficient documentation

## 2020-05-08 DIAGNOSIS — M8580 Other specified disorders of bone density and structure, unspecified site: Secondary | ICD-10-CM | POA: Insufficient documentation

## 2020-05-09 NOTE — Progress Notes (Signed)
Subjective:  Patient ID: Christina [name], female    DOB: [birth year],  MRN: [id number]  70 y.o. female presents with preventative diabetic foot care and painful corn(s) b/l 5th toes and callus(es) b/l feet and painful mycotic nails.  Pain interferes with ambulation. Aggravating factors include wearing enclosed shoe gear.   She states she does not monitor her blood glucose daily.    PCP is Dr. Lona Kettle and last visit was 04/15/2020.  Review of Systems: Negative except as noted in the HPI. Denies N/V/F/Ch. Past Medical History:  Diagnosis Date  . Arthritis    OA RIGHT KNEE AND PAIN  . Cancer ([place]) 2011   HX OF COLON CANCER; S/P SURGERY AND DID NOT HAVE TO HAVE CHEMO OR RADIATION  . Diabetes mellitus without complication ([place])   . Hypertension    Past Surgical History:  Procedure Laterality Date  . BREAST SURGERY     RIGHT AND LEFT BREAST TUMORS REMOVED - BENIGN  . C-SECTIONS X 2    . COLON SURGERY    . FATTY TUMOR REMOVED FROM RIGHT HEEL    . TONSILLECTOMY     AT AGE OF 12  . TOTAL KNEE ARTHROPLASTY Right 08/02/2013   Procedure: RIGHT TOTAL KNEE ARTHROPLASTY;  Surgeon: Tobi Bastos, MD;  Location: [facility];  Service: Orthopedics;  Laterality: Right;   Patient Active Problem List   Diagnosis Date Noted  . Chronic kidney disease with active medical management without dialysis, stage 3 (moderate) ([place]) 05/08/2020  . Chronic pain 05/08/2020  . 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  . Hardening of the aorta (main artery of the heart) ([place]) 05/08/2020  . Hypercholesterolemia 05/08/2020  . Morbid obesity ([place]) 05/08/2020  . Myalgia 05/08/2020  . Osteopenia 05/08/2020  . Sciatica 05/08/2020  . Urgency of urination 05/08/2020  . Urinary incontinence 05/08/2020  . Statin not tolerated 05/08/2020  . Adhesive capsulitis of left shoulder 08/08/2019  . Mass of joint of left hand 02/15/2019  . Mass of joint of left wrist 02/15/2019  . Abdominal pain, vomiting, and diarrhea 01/12/2019  .  Renal insufficiency 01/12/2019  . Adrenal nodule ([place]) 01/12/2019  . Pain in thumb joint with movement, right 12/28/2018  . Pain of left hand 12/28/2018  . Degenerative scoliosis 05/23/2018  . Degenerative spondylolisthesis 05/23/2018  . Constipation 09/07/2013  . Surgical wound infection 08/08/2013  . Diabetes mellitus without complication ([place])   . Hypertension   . Osteoarthritis of right knee 08/02/2013  . Total knee replacement status 08/02/2013  . History of colon cancer 06/19/2010    Current Outpatient Medications:  .  ACCU-CHEK AVIVA PLUS test strip, , Disp: , Rfl:  .  Accu-Chek Softclix Lancets lancets, , Disp: , Rfl:  .  Alcohol Swabs (B-D SINGLE USE SWABS REGULAR) PADS, USE TO TEST BLOOD SUGAR ONCE A DAY, Disp: , Rfl:  .  amLODipine-benazepril (LOTREL) 10-40 MG capsule, Take 1 capsule by mouth daily., Disp: , Rfl:  .  Apple Cider Vinegar 500 MG TABS, See admin instructions., Disp: , Rfl:  .  Blood Glucose Calibration (ACCU-CHEK AVIVA) SOLN, , Disp: , Rfl:  .  Blood Glucose Monitoring Suppl (ACCU-CHEK AVIVA PLUS) w/Device KIT, , Disp: , Rfl:  .  Calcium Carb-Cholecalciferol (CALCIUM + D3) 600-800 MG-UNIT TABS, 1 tablet, Disp: , Rfl:  .  cholecalciferol (VITAMIN D3) 25 MCG (1000 UNIT) tablet, 1 tablet, Disp: , Rfl:  .  furosemide (LASIX) 20 MG tablet, 1 tablet, Disp: , Rfl:  .  metFORMIN (GLUCOPHAGE-XR) 500 MG 24 hr  tablet, , Disp: , Rfl:  .  Multiple Vitamins-Minerals (IMMUNE SUPPORT) CHEW, See admin instructions., Disp: , Rfl:  .  ondansetron (ZOFRAN) 4 MG tablet, Take 1 tablet (4 mg total) by mouth every 6 (six) hours as needed for nausea., Disp: 20 tablet, Rfl: 0 .  ondansetron (ZOFRAN-ODT) 8 MG disintegrating tablet, DISSOLVE 1 TABLET IN MOUTH EVERY 8 HOURS AS NEEDED FOR NAUSEA FOR 5 DAYS, Disp: , Rfl:  .  oxybutynin (DITROPAN) 5 MG tablet, 1 tablet, Disp: , Rfl:  .  oxyCODONE (OXY IR/ROXICODONE) 5 MG immediate release tablet, oxycodone 5 mg tablet, Disp: , Rfl:  .   pravastatin (PRAVACHOL) 10 MG tablet, Take 10 mg by mouth once a week., Disp: , Rfl:  .  metFORMIN (GLUCOPHAGE) 500 MG tablet, Take 1 tablet (500 mg total) by mouth 2 (two) times daily with a meal., Disp: 60 tablet, Rfl: 11 Allergies  Allergen Reactions  . Atorvastatin     Other reaction(s): Cramps (ALLERGY/intolerance), Other  . Penicillins Dermatitis and Rash    Skin and nails peel  Other reaction(s): rash/ skin peel/ and nails come off  . Rosuvastatin     Other reaction(s): Cramps (ALLERGY/intolerance) Other reaction(s): muscle pain  . Cholestyramine     Other reaction(s): diarrhea  . Gemfibrozil     Other reaction(s): vomiting and diarrhea  . Rosuvastatin Calcium    Social History   Tobacco Use  Smoking Status Never Smoker  Smokeless Tobacco Never Used   Objective:  There were no vitals filed for this visit. Constitutional Patient is a pleasant 70 y.o. African American female, in NAD.Marland Kitchen  Vascular Neurovascular status unchanged b/l lower extremities. Capillary refill time to digits immediate b/l. Palpable pedal pulses b/l LE. Pedal hair sparse. Lower extremity skin temperature gradient within normal limits. No pain with calf compression b/l. No cyanosis or clubbing noted.  Neurologic Normal speech. Oriented to person, place, and time. Protective sensation intact 5/5 intact bilaterally with 10g monofilament b/l. Vibratory sensation intact b/l.  Dermatologic Pedal skin with normal turgor, texture and tone bilaterally. No open wounds bilaterally. No interdigital macerations bilaterally. Toenails 1-5 b/l elongated, discolored, dystrophic, thickened, crumbly with subungual debris and tenderness to dorsal palpation. Hyperkeratotic lesion(s) L 2nd toe, R hallux, R 5th toe, submet head 5 left foot and submet head 5 right foot.  No erythema, no edema, no drainage, no flocculence.  Orthopedic: Normal muscle strength 5/5 to all lower extremity muscle groups bilaterally. No pain crepitus or joint  limitation noted with ROM b/l. No gross bony deformities bilaterally. Well healed surgical scar medial heel pad right foot secondary to lipoma removal.    Radiographs: None  Lab Results  Component Value Date   HGBA1C 8.2 (H) 01/13/2019   Assessment:   1. Pain due to onychomycosis of toenails of both feet   2. Corns and callosities   3. Hammer toes of both feet   4. Controlled type 2 diabetes mellitus without complication, without long-term current use of insulin ([place])    Plan:  Patient was evaluated and treated and all questions answered.  Onychomycosis with pain -Nails palliatively debridement as below. -Educated on self-care  Procedure: Nail Debridement Rationale: Pain Type of Debridement: manual, sharp debridement. Instrumentation: Nail nipper, rotary burr. Number of Nails: 10  -Examined patient. -No new findings. No new orders. -Continue diabetic foot care principles. -Toenails 1-5 b/l were debrided in length and girth with sterile nail nippers and dremel without iatrogenic bleeding.  -Corn(s) L 5th toe and R 5th  toe and callus(es) R hallux, submet head 5 left foot and submet head 5 right foot were pared utilizing sterile scalpel blade without incident. Total number debrided =5.  Return in about 3 months (around 08/06/2020).  Marzetta Board, DPM

## 2020-06-10 DIAGNOSIS — M25551 Pain in right hip: Secondary | ICD-10-CM | POA: Insufficient documentation

## 2020-06-10 DIAGNOSIS — M25562 Pain in left knee: Secondary | ICD-10-CM | POA: Insufficient documentation

## 2020-06-13 DIAGNOSIS — M1712 Unilateral primary osteoarthritis, left knee: Secondary | ICD-10-CM | POA: Diagnosis not present

## 2020-06-13 DIAGNOSIS — M25551 Pain in right hip: Secondary | ICD-10-CM | POA: Diagnosis not present

## 2020-06-23 DIAGNOSIS — E7800 Pure hypercholesterolemia, unspecified: Secondary | ICD-10-CM | POA: Diagnosis not present

## 2020-06-23 DIAGNOSIS — N183 Chronic kidney disease, stage 3 unspecified: Secondary | ICD-10-CM | POA: Diagnosis not present

## 2020-06-23 DIAGNOSIS — Z85038 Personal history of other malignant neoplasm of large intestine: Secondary | ICD-10-CM | POA: Diagnosis not present

## 2020-06-23 DIAGNOSIS — I10 Essential (primary) hypertension: Secondary | ICD-10-CM | POA: Diagnosis not present

## 2020-06-23 DIAGNOSIS — M8580 Other specified disorders of bone density and structure, unspecified site: Secondary | ICD-10-CM | POA: Diagnosis not present

## 2020-06-23 DIAGNOSIS — G8929 Other chronic pain: Secondary | ICD-10-CM | POA: Diagnosis not present

## 2020-06-23 DIAGNOSIS — E1169 Type 2 diabetes mellitus with other specified complication: Secondary | ICD-10-CM | POA: Diagnosis not present

## 2020-08-01 DIAGNOSIS — I10 Essential (primary) hypertension: Secondary | ICD-10-CM | POA: Diagnosis not present

## 2020-08-09 ENCOUNTER — Ambulatory Visit: Payer: Medicare HMO | Admitting: Podiatry

## 2020-09-27 DIAGNOSIS — I10 Essential (primary) hypertension: Secondary | ICD-10-CM | POA: Diagnosis not present

## 2020-09-27 DIAGNOSIS — E1169 Type 2 diabetes mellitus with other specified complication: Secondary | ICD-10-CM | POA: Diagnosis not present

## 2020-09-27 DIAGNOSIS — G8929 Other chronic pain: Secondary | ICD-10-CM | POA: Diagnosis not present

## 2020-09-27 DIAGNOSIS — Z85038 Personal history of other malignant neoplasm of large intestine: Secondary | ICD-10-CM | POA: Diagnosis not present

## 2020-09-27 DIAGNOSIS — M179 Osteoarthritis of knee, unspecified: Secondary | ICD-10-CM | POA: Diagnosis not present

## 2020-09-27 DIAGNOSIS — E7800 Pure hypercholesterolemia, unspecified: Secondary | ICD-10-CM | POA: Diagnosis not present

## 2020-09-27 DIAGNOSIS — N183 Chronic kidney disease, stage 3 unspecified: Secondary | ICD-10-CM | POA: Diagnosis not present

## 2020-09-27 DIAGNOSIS — M8580 Other specified disorders of bone density and structure, unspecified site: Secondary | ICD-10-CM | POA: Diagnosis not present

## 2020-10-01 DIAGNOSIS — I10 Essential (primary) hypertension: Secondary | ICD-10-CM | POA: Diagnosis not present

## 2020-10-07 ENCOUNTER — Ambulatory Visit: Payer: Medicare HMO | Admitting: Podiatry

## 2020-10-08 ENCOUNTER — Encounter: Payer: Self-pay | Admitting: Podiatry

## 2020-10-08 ENCOUNTER — Other Ambulatory Visit: Payer: Self-pay

## 2020-10-08 ENCOUNTER — Ambulatory Visit (INDEPENDENT_AMBULATORY_CARE_PROVIDER_SITE_OTHER): Payer: Medicare HMO | Admitting: Podiatry

## 2020-10-08 DIAGNOSIS — E119 Type 2 diabetes mellitus without complications: Secondary | ICD-10-CM

## 2020-10-08 DIAGNOSIS — M79675 Pain in left toe(s): Secondary | ICD-10-CM | POA: Diagnosis not present

## 2020-10-08 DIAGNOSIS — L84 Corns and callosities: Secondary | ICD-10-CM | POA: Diagnosis not present

## 2020-10-08 DIAGNOSIS — B351 Tinea unguium: Secondary | ICD-10-CM

## 2020-10-08 DIAGNOSIS — M79674 Pain in right toe(s): Secondary | ICD-10-CM

## 2020-10-10 DIAGNOSIS — N183 Chronic kidney disease, stage 3 unspecified: Secondary | ICD-10-CM | POA: Diagnosis not present

## 2020-10-10 DIAGNOSIS — R32 Unspecified urinary incontinence: Secondary | ICD-10-CM | POA: Diagnosis not present

## 2020-10-10 DIAGNOSIS — Z7984 Long term (current) use of oral hypoglycemic drugs: Secondary | ICD-10-CM | POA: Diagnosis not present

## 2020-10-10 DIAGNOSIS — E1169 Type 2 diabetes mellitus with other specified complication: Secondary | ICD-10-CM | POA: Diagnosis not present

## 2020-10-10 DIAGNOSIS — I10 Essential (primary) hypertension: Secondary | ICD-10-CM | POA: Diagnosis not present

## 2020-10-14 ENCOUNTER — Other Ambulatory Visit (HOSPITAL_COMMUNITY): Payer: Medicare HMO

## 2020-10-14 NOTE — Progress Notes (Signed)
Subjective: Christina [name] is a pleasant 70 y.o. female patient seen today for preventative diabetic foot care. She is seen for corns, calluses and painful thick toenails which pose a risk for her. Pain interferes with ambulation. Aggravating factors include wearing enclosed shoe gear. Pain is relieved with periodic professional debridement.  Patient states her blood glucose was 119 mg/dl on last night. She has not checked it today.  PCP is [name], MD. Last visit was: 10/01/2020.  Allergies  Allergen Reactions   Atorvastatin     Other reaction(s): Cramps (ALLERGY/intolerance), Other   Penicillins Dermatitis and Rash    Skin and nails peel  Other reaction(s): rash/ skin peel/ and nails come off   Rosuvastatin     Other reaction(s): Cramps (ALLERGY/intolerance) Other reaction(s): muscle pain   Sulfa Antibiotics     Other reaction(s): Muscle Cramps, Vomiting   Cholestyramine     Other reaction(s): diarrhea   Gemfibrozil     Other reaction(s): vomiting and diarrhea   Rosuvastatin Calcium     Objective: Physical Exam  General: Christina [name] is a pleasant 70 y.o. African American female, in NAD. AAO x 3.   Vascular:  Capillary refill time to digits immediate b/l. Palpable DP pulse(s) b/l lower extremities Palpable PT pulse(s) b/l lower extremities Pedal hair sparse. Lower extremity skin temperature gradient within normal limits. No pain with calf compression b/l.  Dermatological:  Pedal skin with normal turgor, texture and tone bilaterally. No open wounds bilaterally. No interdigital macerations bilaterally. Toenails 1-5 b/l elongated, discolored, dystrophic, thickened, crumbly with subungual debris and tenderness to dorsal palpation. Hyperkeratotic lesion(s) L 2nd toe, R hallux, R 5th toe, submet head 5 left foot, and submet head 5 right foot.  No erythema, no edema, no drainage, no fluctuance.  Musculoskeletal:  Normal muscle strength 5/5 to all lower extremity muscle  groups bilaterally. Well healed surgical scar medial heel pad right foot. No pain crepitus or joint limitation noted with ROM b/l. No gross bony deformities bilaterally.  Neurological:  Protective sensation intact 5/5 intact bilaterally with 10g monofilament b/l. Vibratory sensation intact b/l.  Assessment and Plan:  1. Pain due to onychomycosis of toenails of both feet   2. Corns and callosities   3. Controlled type 2 diabetes mellitus without complication, without long-term current use of insulin ([place])     -Examined patient. -Continue diabetic foot care principles. -Patient to continue soft, supportive shoe gear daily. -Toenails 1-5 b/l were debrided in length and girth with sterile nail nippers and dremel without iatrogenic bleeding.  -Corn(s) L 2nd toe, R hallux, and R 5th toe and callus(es) submet head 5 left foot and submet head 5 right foot were pared utilizing sterile scalpel blade without incident. Total number debrided =5. -Patient to report any pedal injuries to medical professional immediately. -Patient/POA to call should there be question/concern in the interim.  Return in about 3 months (around 01/08/2021).  Marzetta Board, DPM

## 2020-10-21 ENCOUNTER — Other Ambulatory Visit (HOSPITAL_COMMUNITY): Payer: Medicare HMO

## 2020-10-28 ENCOUNTER — Ambulatory Visit: Admit: 2020-10-28 | Payer: Medicare HMO | Admitting: Orthopedic Surgery

## 2020-10-28 SURGERY — ARTHROPLASTY, KNEE, TOTAL
Anesthesia: Choice | Site: Knee | Laterality: Left

## 2020-10-31 DIAGNOSIS — I10 Essential (primary) hypertension: Secondary | ICD-10-CM | POA: Diagnosis not present

## 2020-11-15 DIAGNOSIS — N3946 Mixed incontinence: Secondary | ICD-10-CM | POA: Diagnosis not present

## 2020-11-20 DIAGNOSIS — M179 Osteoarthritis of knee, unspecified: Secondary | ICD-10-CM | POA: Diagnosis not present

## 2020-11-20 DIAGNOSIS — G8929 Other chronic pain: Secondary | ICD-10-CM | POA: Diagnosis not present

## 2020-11-20 DIAGNOSIS — N183 Chronic kidney disease, stage 3 unspecified: Secondary | ICD-10-CM | POA: Diagnosis not present

## 2020-11-20 DIAGNOSIS — E1169 Type 2 diabetes mellitus with other specified complication: Secondary | ICD-10-CM | POA: Diagnosis not present

## 2020-11-20 DIAGNOSIS — E7800 Pure hypercholesterolemia, unspecified: Secondary | ICD-10-CM | POA: Diagnosis not present

## 2020-11-20 DIAGNOSIS — M8580 Other specified disorders of bone density and structure, unspecified site: Secondary | ICD-10-CM | POA: Diagnosis not present

## 2020-11-20 DIAGNOSIS — I10 Essential (primary) hypertension: Secondary | ICD-10-CM | POA: Diagnosis not present

## 2020-11-28 DIAGNOSIS — I10 Essential (primary) hypertension: Secondary | ICD-10-CM | POA: Diagnosis not present

## 2020-12-16 DIAGNOSIS — R2232 Localized swelling, mass and lump, left upper limb: Secondary | ICD-10-CM | POA: Insufficient documentation

## 2020-12-16 DIAGNOSIS — M79645 Pain in left finger(s): Secondary | ICD-10-CM | POA: Diagnosis not present

## 2020-12-16 DIAGNOSIS — E119 Type 2 diabetes mellitus without complications: Secondary | ICD-10-CM | POA: Diagnosis not present

## 2021-01-01 DIAGNOSIS — I10 Essential (primary) hypertension: Secondary | ICD-10-CM | POA: Diagnosis not present

## 2021-01-02 ENCOUNTER — Other Ambulatory Visit: Payer: Self-pay | Admitting: Orthopedic Surgery

## 2021-01-02 DIAGNOSIS — Z4789 Encounter for other orthopedic aftercare: Secondary | ICD-10-CM | POA: Insufficient documentation

## 2021-01-02 DIAGNOSIS — D1801 Hemangioma of skin and subcutaneous tissue: Secondary | ICD-10-CM | POA: Diagnosis not present

## 2021-01-02 DIAGNOSIS — R2232 Localized swelling, mass and lump, left upper limb: Secondary | ICD-10-CM | POA: Diagnosis not present

## 2021-01-14 DIAGNOSIS — Z0000 Encounter for general adult medical examination without abnormal findings: Secondary | ICD-10-CM | POA: Diagnosis not present

## 2021-01-14 DIAGNOSIS — M179 Osteoarthritis of knee, unspecified: Secondary | ICD-10-CM | POA: Diagnosis not present

## 2021-01-14 DIAGNOSIS — N183 Chronic kidney disease, stage 3 unspecified: Secondary | ICD-10-CM | POA: Diagnosis not present

## 2021-01-14 DIAGNOSIS — R32 Unspecified urinary incontinence: Secondary | ICD-10-CM | POA: Diagnosis not present

## 2021-01-14 DIAGNOSIS — R3915 Urgency of urination: Secondary | ICD-10-CM | POA: Diagnosis not present

## 2021-01-14 DIAGNOSIS — E1169 Type 2 diabetes mellitus with other specified complication: Secondary | ICD-10-CM | POA: Diagnosis not present

## 2021-01-14 DIAGNOSIS — E7800 Pure hypercholesterolemia, unspecified: Secondary | ICD-10-CM | POA: Diagnosis not present

## 2021-01-14 DIAGNOSIS — I10 Essential (primary) hypertension: Secondary | ICD-10-CM | POA: Diagnosis not present

## 2021-01-15 ENCOUNTER — Other Ambulatory Visit: Payer: Self-pay

## 2021-01-15 ENCOUNTER — Ambulatory Visit: Payer: Medicare HMO | Admitting: Podiatry

## 2021-01-15 ENCOUNTER — Encounter: Payer: Self-pay | Admitting: Podiatry

## 2021-01-15 DIAGNOSIS — M79674 Pain in right toe(s): Secondary | ICD-10-CM

## 2021-01-15 DIAGNOSIS — L84 Corns and callosities: Secondary | ICD-10-CM | POA: Diagnosis not present

## 2021-01-15 DIAGNOSIS — E119 Type 2 diabetes mellitus without complications: Secondary | ICD-10-CM | POA: Diagnosis not present

## 2021-01-15 DIAGNOSIS — M79675 Pain in left toe(s): Secondary | ICD-10-CM

## 2021-01-15 DIAGNOSIS — B351 Tinea unguium: Secondary | ICD-10-CM

## 2021-01-16 DIAGNOSIS — H25813 Combined forms of age-related cataract, bilateral: Secondary | ICD-10-CM | POA: Diagnosis not present

## 2021-01-16 DIAGNOSIS — H524 Presbyopia: Secondary | ICD-10-CM | POA: Diagnosis not present

## 2021-01-16 DIAGNOSIS — H5203 Hypermetropia, bilateral: Secondary | ICD-10-CM | POA: Diagnosis not present

## 2021-01-16 DIAGNOSIS — E1136 Type 2 diabetes mellitus with diabetic cataract: Secondary | ICD-10-CM | POA: Diagnosis not present

## 2021-01-20 NOTE — Progress Notes (Signed)
Subjective:  Patient ID: Christina [name], female    DOB: [birth year],  MRN: [id number]  Christina [name] presents to clinic today for preventative diabetic foot care and corn(s) left 2nd toe, right 5th toe , callus(es) right hallux and submnet head 5 b/l and painful mycotic nails.  Pain interferes with ambulation. Aggravating factors include wearing enclosed shoe gear. Painful toenails interfere with ambulation. Aggravating factors include wearing enclosed shoe gear. Pain is relieved with periodic professional debridement. Painful corns and calluses are aggravated when weightbearing with and without shoegear. Pain is relieved with periodic professional debridement.  Patient does not monitor blood glucose daily.  She will be having left knee arthroplasty in November.   PCP is [name], MD , and last visit was 11/28/2020.  Allergies  Allergen Reactions   Atorvastatin     Other reaction(s): Cramps (ALLERGY/intolerance), Other   Penicillins Dermatitis and Rash    Skin and nails peel  Other reaction(s): rash/ skin peel/ and nails come off   Rosuvastatin     Other reaction(s): Cramps (ALLERGY/intolerance) Other reaction(s): muscle pain   Sulfa Antibiotics     Other reaction(s): Muscle Cramps, Vomiting   Cholestyramine     Other reaction(s): diarrhea   Gemfibrozil     Other reaction(s): vomiting and diarrhea   Rosuvastatin Calcium     Review of Systems: Negative except as noted in the HPI. Objective:   Constitutional Christina [name] is a pleasant 70 y.o. African American female, obese in NAD. AAO x 3.   Vascular Capillary refill time to digits immediate b/l. Palpable pedal pulses b/l LE. Pedal hair sparse. Lower extremity skin temperature gradient within normal limits. No pain with calf compression b/l. Trace edema noted b/l lower extremities. No cyanosis or clubbing noted.  Neurologic Normal speech. Oriented to person, place, and time. Protective sensation intact 5/5 intact bilaterally  with 10g monofilament b/l. Vibratory sensation intact b/l.  Dermatologic Skin warm and supple b/l lower extremities. No open wounds b/l lower extremities. No interdigital macerations b/l lower extremities. Toenails 1-5 b/l elongated, discolored, dystrophic, thickened, crumbly with subungual debris and tenderness to dorsal palpation. Hyperkeratotic lesion(s) L 2nd toe, R hallux, R 5th toe, submet head 5 left foot, and submet head 5 right foot.  No erythema, no edema, no drainage, no fluctuance.  Orthopedic: Normal muscle strength 5/5 to all lower extremity muscle groups bilaterally. No pain crepitus or joint limitation noted with ROM b/l lower extremities. No gross bony deformities b/l lower extremities.   Radiographs: None Assessment:   1. Pain due to onychomycosis of toenails of both feet   2. Corns and callosities   3. Controlled type 2 diabetes mellitus without complication, without long-term current use of insulin ([place])    Plan:  Patient was evaluated and treated and all questions answered. Consent given for treatment as described below: -Examined patient. -Continue diabetic foot care principles: inspect feet daily, monitor glucose as recommended by PCP and/or Endocrinologist, and follow prescribed diet per PCP, Endocrinologist and/or dietician. -Patient to continue soft, supportive shoe gear daily. -Toenails 1-5 b/l were debrided in length and girth with sterile nail nippers and dremel without iatrogenic bleeding.  -Corn(s) L 2nd toe and R 5th toe and callus(es) R hallux, submet head 5 left foot, and submet head 5 right foot were pared utilizing sterile scalpel blade without incident. Total number debrided =5. -Patient to report any pedal injuries to medical professional immediately. -Patient/POA to call should there be question/concern in the interim.  Return in about 3  months (around 04/16/2021).  Marzetta Board, DPM

## 2021-01-27 IMAGING — MR MR ABDOMEN WO/W CM
12 of 17 series · 30 of 48 positions shown · IV contrast (multihance)
Comparison: CT on 01/12/2019

CLINICAL DATA: Indeterminate left adrenal nodule on recent CT.

EXAM:
MRI ABDOMEN WITHOUT AND WITH CONTRAST
TECHNIQUE: Multiplanar multisequence MR imaging of the abdomen was performed
both before and after the administration of intravenous contrast.
CONTRAST:  20mL MULTIHANCE GADOBENATE DIMEGLUMINE 529 MG/ML IV SOLN

[Series 4: T2 · coronal · 5.0mm · 1.56mm/px · 1 of 30 slices shown (1 of 3)]
[im 1/30]
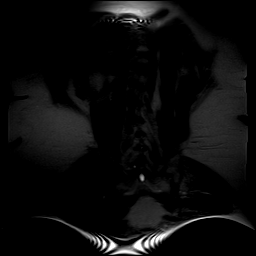

[Series 5: T2 · axial · 5.0mm · 1.56mm/px · 1 of 34 slices shown (2 of 3)]
[im 1/34]
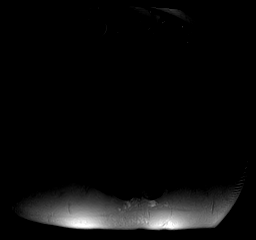

[Series 6: [id number]_500_800_p2 · axial · 6.0mm · 2.19mm/px · z∈[-28,+198]mm · 5 of 90 slices shown]
[im 1/90]
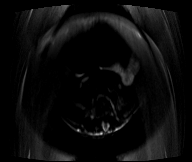
[im 23/90]
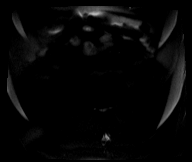
[im 45/90]
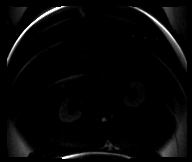
[im 67/90]
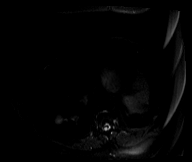
[im 90/90]
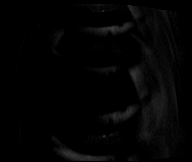

[Series 7: [id number]_adc · axial · 6.0mm · 2.19mm/px · z∈[-28,+198]mm · 2 of 30 slices shown]
[im 1/30]
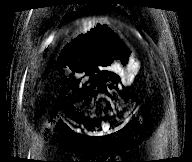
[im 30/30]
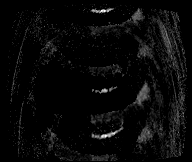

[Series 8: T2 · axial · 5.0mm · 0.82mm/px · z∈[-1,+188]mm · 2 of 30 slices shown (3 of 3)]
[im 1/30]
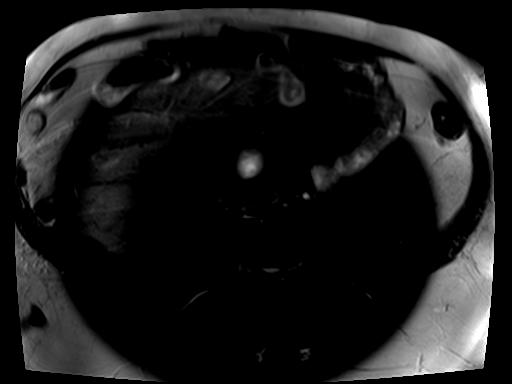
[im 30/30]
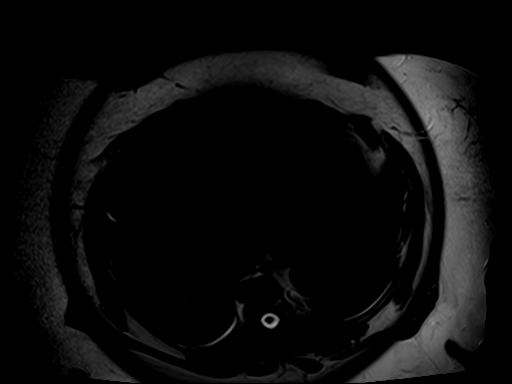

[Series 9: axial in out · axial · 5.5mm · 0.80mm/px · z∈[-9,+184]mm · 3 of 54 slices shown]
[im 1/54]
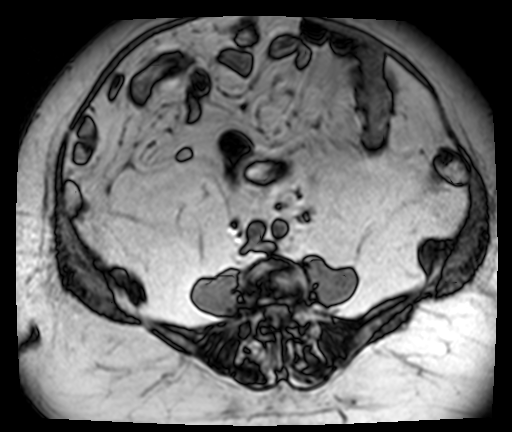
[im 27/54]
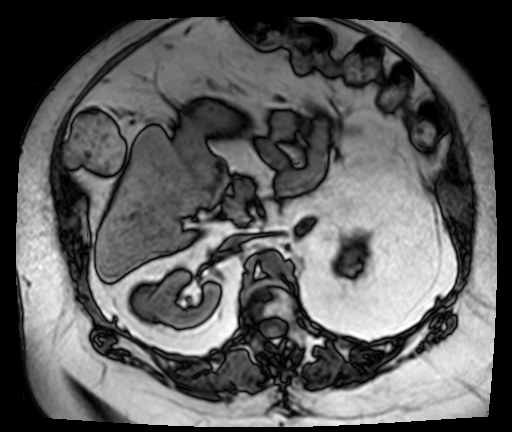
[im 54/54]
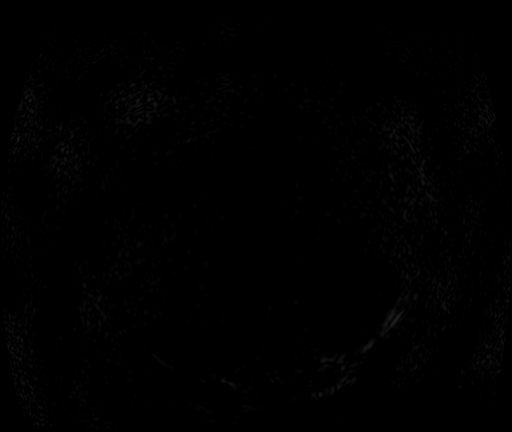

[Series 10: cor in out · coronal · 5.5mm · 0.80mm/px · 3 of 54 slices shown]
[im 1/54]
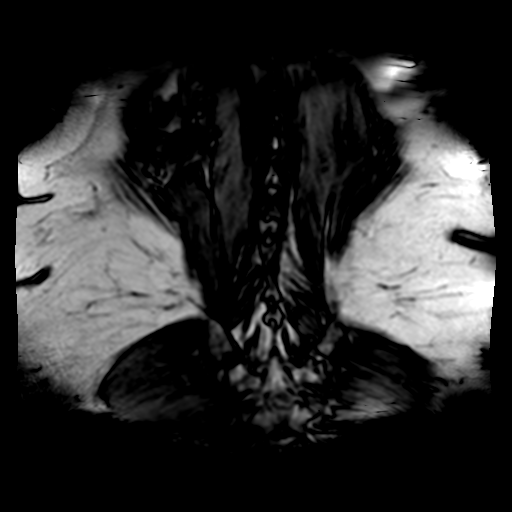
[im 27/54]
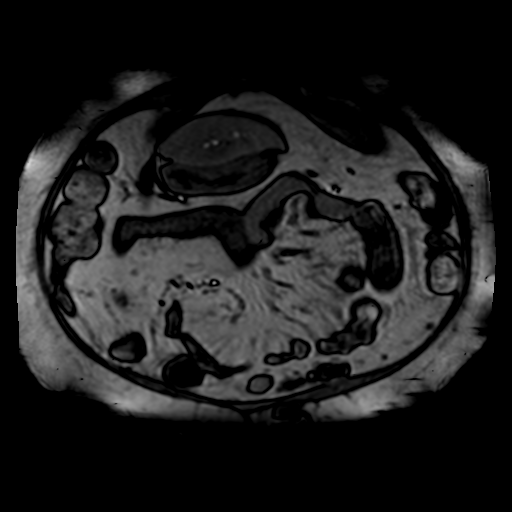
[im 54/54]
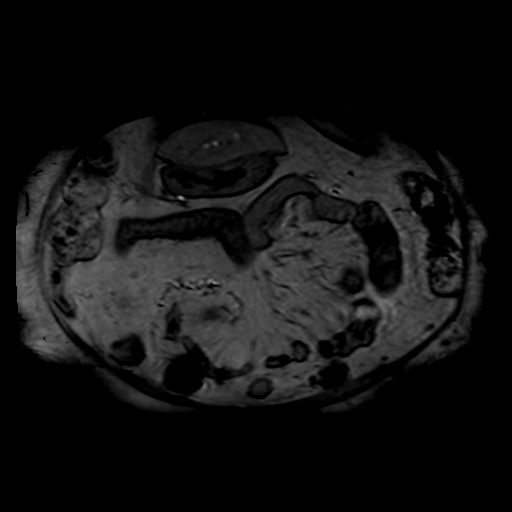

[Series 12: T1 dynamic · axial · non-contrast · 2.0mm · 0.80mm/px · z∈[+13,+107]mm · 3 of 48 slices shown]
[im 1/48]
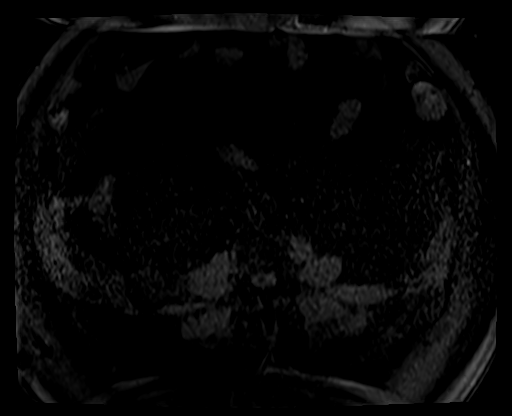
[im 24/48]
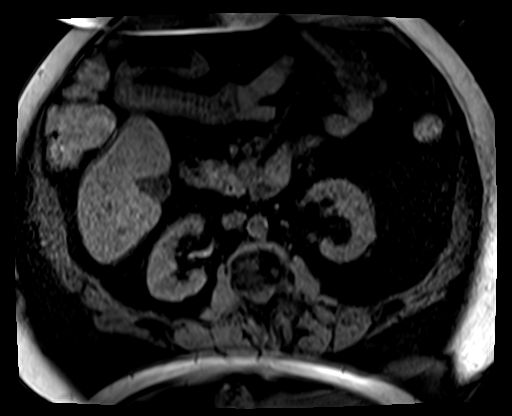
[im 48/48]
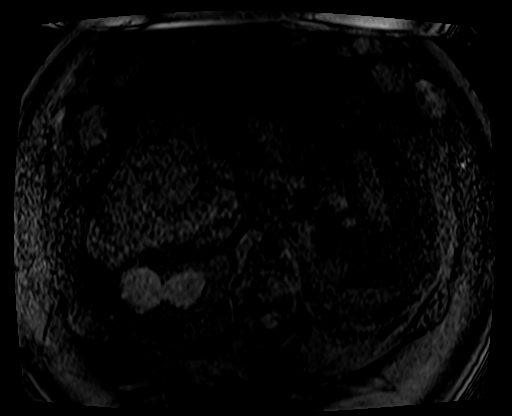

[Series 13: T1 dynamic fat-sat post-contrast · axial · 2.0mm · 0.80mm/px · z∈[+13,+107]mm · 3 of 48 slices shown (1 of 4)]
[im 1/48]
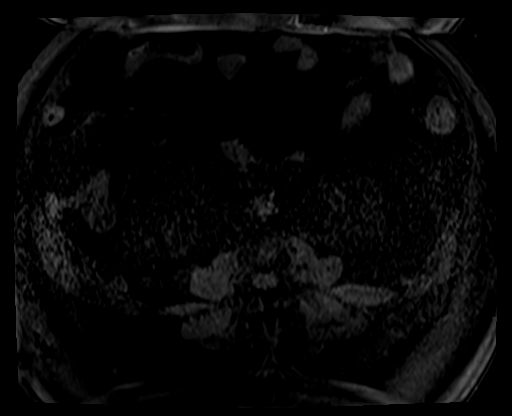
[im 24/48]
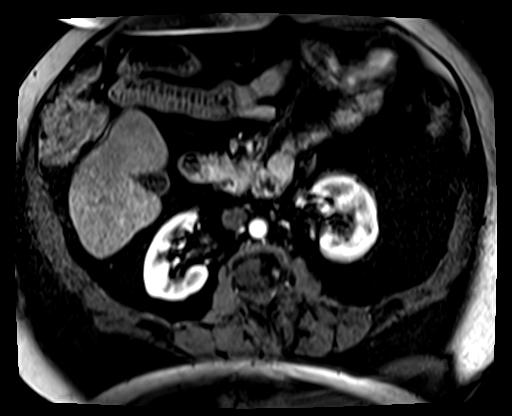
[im 48/48]
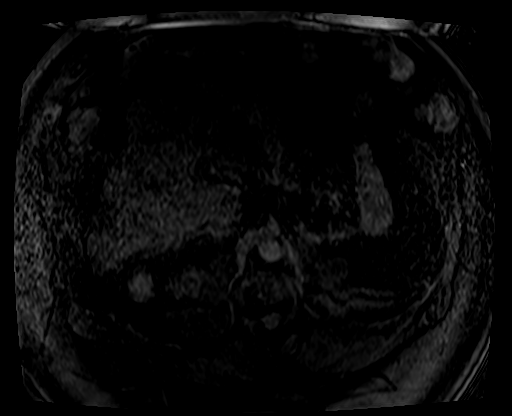

[Series 14: T1 dynamic fat-sat post-contrast · axial · 2.0mm · 0.80mm/px · z∈[+13,+107]mm · 3 of 48 slices shown (2 of 4)]
[im 1/48]
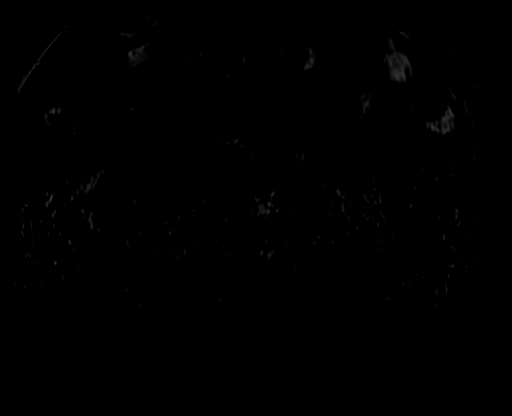
[im 24/48]
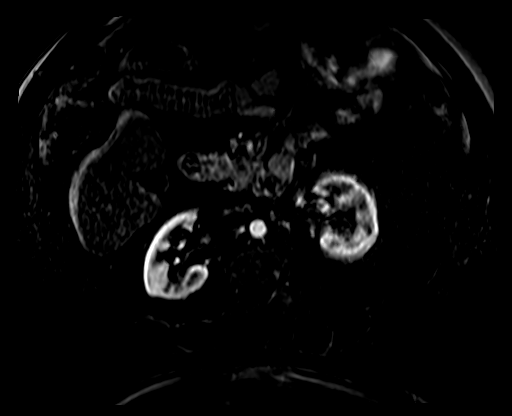
[im 48/48]
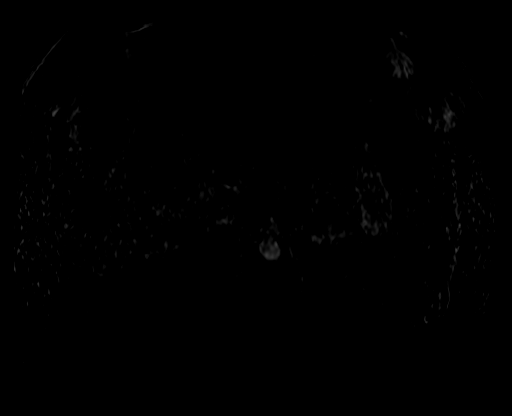

[Series 15: T1 dynamic fat-sat post-contrast · axial · 2.0mm · 0.80mm/px · z∈[+13,+107]mm · 3 of 48 slices shown (3 of 4)]
[im 1/48]
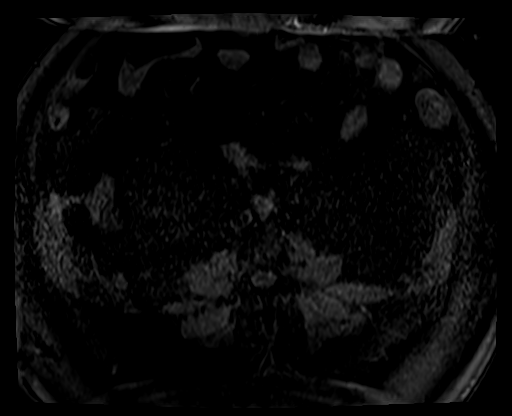
[im 24/48]
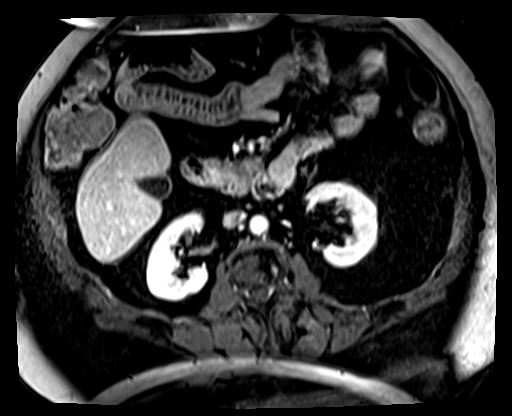
[im 48/48]
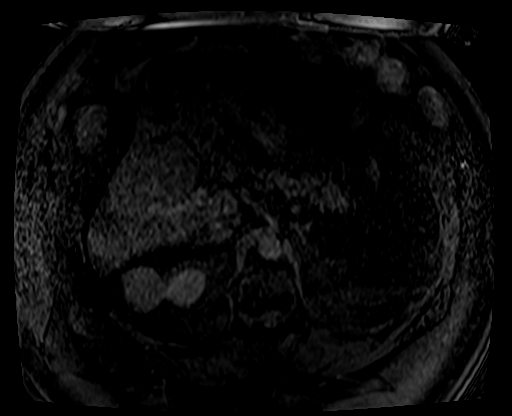

[Series 16: T1 dynamic fat-sat post-contrast · axial · 2.0mm · 0.80mm/px · 1 of 48 slices shown (4 of 4)]
[im 1/48]
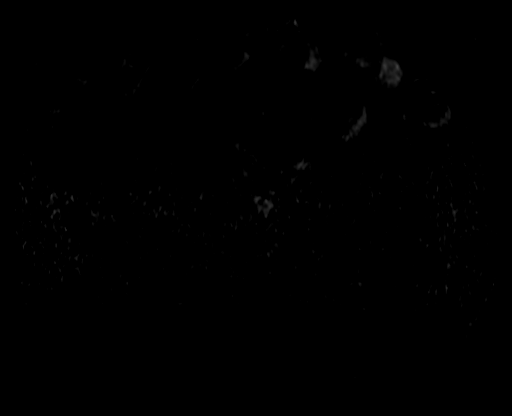

[30 of 48 positions shown; findings below may reference images not displayed]

FINDINGS: Lower chest: No acute findings.

Hepatobiliary: No hepatic masses identified. Mild diffuse hepatic
steatosis. Gallbladder is unremarkable. No evidence of biliary
ductal dilatation.

Pancreas: Several tiny unilocular cystic lesions are seen in the
pancreatic body, largest measuring 11 mm on image [DATE]. No complex
features are seen. No evidence of pancreatic ductal dilatation.

Spleen:  Within normal limits in size and appearance.

Adrenals/Urinary Tract: Mild thickening of left adrenal gland is
seen without evidence of discrete mass. Normal appearance of right
adrenal gland. Simple cyst seen in upper pole of right kidney. No
evidence of renal masses or hydronephrosis.

Stomach/Bowel: Visualized portion unremarkable.

Vascular/Lymphatic: No pathologically enlarged lymph nodes
identified. No abdominal aortic aneurysm.

Other:  None.

Musculoskeletal:  No suspicious bone lesions identified.
IMPRESSION: Several tiny cystic lesions in pancreatic body, largest measuring 11
mm. No high risk features seen. Recommend continued followup by MRI
in 2 years. This recommendation follows ACR consensus guidelines:
Management of Incidental Pancreatic Cysts: A White Paper of the ACR
Incidental Findings Committee. [HOSPITAL] 7556;[DATE].

Mild diffuse hepatic steatosis.

Mild diffuse thickening of left adrenal gland, without evidence of
discrete mass, consistent with mild adrenal hyperplasia.

## 2021-01-28 DIAGNOSIS — Z1231 Encounter for screening mammogram for malignant neoplasm of breast: Secondary | ICD-10-CM | POA: Diagnosis not present

## 2021-01-30 DIAGNOSIS — I10 Essential (primary) hypertension: Secondary | ICD-10-CM | POA: Diagnosis not present

## 2021-02-06 DIAGNOSIS — R350 Frequency of micturition: Secondary | ICD-10-CM | POA: Diagnosis not present

## 2021-02-06 DIAGNOSIS — N3946 Mixed incontinence: Secondary | ICD-10-CM | POA: Diagnosis not present

## 2021-03-01 DIAGNOSIS — Z23 Encounter for immunization: Secondary | ICD-10-CM | POA: Diagnosis not present

## 2021-03-03 DIAGNOSIS — I10 Essential (primary) hypertension: Secondary | ICD-10-CM | POA: Diagnosis not present

## 2021-03-11 DIAGNOSIS — G8929 Other chronic pain: Secondary | ICD-10-CM | POA: Diagnosis not present

## 2021-03-11 DIAGNOSIS — M8580 Other specified disorders of bone density and structure, unspecified site: Secondary | ICD-10-CM | POA: Diagnosis not present

## 2021-03-11 DIAGNOSIS — I10 Essential (primary) hypertension: Secondary | ICD-10-CM | POA: Diagnosis not present

## 2021-03-11 DIAGNOSIS — E1169 Type 2 diabetes mellitus with other specified complication: Secondary | ICD-10-CM | POA: Diagnosis not present

## 2021-03-11 DIAGNOSIS — N183 Chronic kidney disease, stage 3 unspecified: Secondary | ICD-10-CM | POA: Diagnosis not present

## 2021-03-20 DIAGNOSIS — N3946 Mixed incontinence: Secondary | ICD-10-CM | POA: Diagnosis not present

## 2021-03-21 NOTE — Patient Instructions (Addendum)
DUE TO COVID-19 ONLY ONE VISITOR IS ALLOWED TO COME WITH YOU AND STAY IN THE WAITING ROOM ONLY DURING PRE OP AND PROCEDURE DAY OF SURGERY IF YOU ARE GOING HOME AFTER SURGERY. IF YOU ARE SPENDING THE NIGHT 2 PEOPLE MAY VISIT WITH YOU IN YOUR PRIVATE ROOM AFTER SURGERY UNTIL VISITING  HOURS ARE OVER AT 8:00 PM AND 1  VISITOR  MAY  SPEND THE NIGHT.                 Christina [name]     Your procedure is scheduled on: 03/31/21   Report to [facility] Main  Entrance   Report to admitting at 8:00 AM     Call this number if you have problems the morning of surgery [phone]   No food after midnight.    You may have clear liquid until 7:30 AM.    At 7:00 AM drink pre surgery drink.   Nothing by mouth after 7:30 AM.    CLEAR LIQUID DIET   Foods Allowed                                                                     Foods Excluded  Coffee and tea, regular and decaf                             liquids that you cannot  Plain Jell-O any favor except red or purple                                           see through such as: Fruit ices (not with fruit pulp)                                     milk, soups, orange juice  Iced Popsicles                                    All solid food Carbonated beverages, regular and diet                                    Cranberry, grape and apple juices Sports drinks like Gatorade Lightly seasoned clear broth or consume(fat free) Sugar     BRUSH YOUR TEETH MORNING OF SURGERY AND RINSE YOUR MOUTH OUT, NO CHEWING GUM CANDY OR MINTS.     Take these medicines the morning of surgery with A SIP OF WATER: Vibegron  DO NOT TAKE ANY DIABETIC MEDICATIONS DAY OF YOUR SURGERY                               You may not have any metal on your body including hair pins and              piercings  Do not wear jewelry, make-up, lotions, powders  or perfumes, deodorant             Do not wear nail polish on your fingernails.  Do not shave  48 hours  prior to surgery.                 Do not bring valuables to the hospital. [name].  Contacts, dentures or bridgework may not be worn into surgery.  Leave suitcase in the car. After surgery it may be brought to your room.                  Please read over the following fact sheets you were given: _____________________________________________________________________             [facility] - Preparing for Surgery Before surgery, you can play an important role.  Because skin is not sterile, your skin needs to be as free of germs as possible.  You can reduce the number of germs on your skin by washing with CHG (chlorahexidine gluconate) soap before surgery.  CHG is an antiseptic cleaner which kills germs and bonds with the skin to continue killing germs even after washing. Please DO NOT use if you have an allergy to CHG or antibacterial soaps.  If your skin becomes reddened/irritated stop using the CHG and inform your nurse when you arrive at Short Stay. Do not shave (including legs and underarms) for at least 48 hours prior to the first CHG shower.  You may shave your face/neck. Please follow these instructions carefully:  1.  Shower with CHG Soap the night before surgery and the  morning of Surgery.  2.  If you choose to wash your hair, wash your hair first as usual with your  normal  shampoo.  3.  After you shampoo, rinse your hair and body thoroughly to remove the  shampoo.                            4.  Use CHG as you would any other liquid soap.  You can apply chg directly  to the skin and wash                       Gently with a scrungie or clean washcloth.  5.  Apply the CHG Soap to your body ONLY FROM THE NECK DOWN.   Do not use on face/ open                           Wound or open sores. Avoid contact with eyes, ears mouth and genitals (private parts).                       Wash face,  Genitals (private parts) with your normal  soap.             6.  Wash thoroughly, paying special attention to the area where your surgery  will be performed.  7.  Thoroughly rinse your body with warm water from the neck down.  8.  DO NOT shower/wash with your normal soap after using and rinsing off  the CHG Soap.                9.  Pat yourself dry with a clean towel.  10.  Wear clean pajamas.            11.  Place clean sheets on your bed the night of your first shower and do not  sleep with pets. Day of Surgery : Do not apply any lotions/deodorants the morning of surgery.  Please wear clean clothes to the hospital/surgery center.  FAILURE TO FOLLOW THESE INSTRUCTIONS MAY RESULT IN THE CANCELLATION OF YOUR SURGERY PATIENT SIGNATURE_________________________________  NURSE SIGNATURE__________________________________  ________________________________________________________________________   Adam Phenix  An incentive spirometer is a tool that can help keep your lungs clear and active. This tool measures how well you are filling your lungs with each breath. Taking long deep breaths may help reverse or decrease the chance of developing breathing (pulmonary) problems (especially infection) following: A long period of time when you are unable to move or be active. BEFORE THE PROCEDURE  If the spirometer includes an indicator to show your best effort, your nurse or respiratory therapist will set it to a desired goal. If possible, sit up straight or lean slightly forward. Try not to slouch. Hold the incentive spirometer in an upright position. INSTRUCTIONS FOR USE  Sit on the edge of your bed if possible, or sit up as far as you can in bed or on a chair. Hold the incentive spirometer in an upright position. Breathe out normally. Place the mouthpiece in your mouth and seal your lips tightly around it. Breathe in slowly and as deeply as possible, raising the piston or the ball toward the top of the column. Hold your  breath for 3-5 seconds or for as long as possible. Allow the piston or ball to fall to the bottom of the column. Remove the mouthpiece from your mouth and breathe out normally. Rest for a few seconds and repeat Steps 1 through 7 at least 10 times every 1-2 hours when you are awake. Take your time and take a few normal breaths between deep breaths. The spirometer may include an indicator to show your best effort. Use the indicator as a goal to work toward during each repetition. After each set of 10 deep breaths, practice coughing to be sure your lungs are clear. If you have an incision (the cut made at the time of surgery), support your incision when coughing by placing a pillow or rolled up towels firmly against it. Once you are able to get out of bed, walk around indoors and cough well. You may stop using the incentive spirometer when instructed by your caregiver.  RISKS AND COMPLICATIONS Take your time so you do not get dizzy or light-headed. If you are in pain, you may need to take or ask for pain medication before doing incentive spirometry. It is harder to take a deep breath if you are having pain. AFTER USE Rest and breathe slowly and easily. It can be helpful to keep track of a log of your progress. Your caregiver can provide you with a simple table to help with this. If you are using the spirometer at home, follow these instructions: [place] IF:  You are having difficultly using the spirometer. You have trouble using the spirometer as often as instructed. Your pain medication is not giving enough relief while using the spirometer. You develop fever of 100.5 F (38.1 C) or higher. SEEK IMMEDIATE MEDICAL CARE IF:  You cough up bloody sputum that had not been present before. You develop fever of 102 F (38.9 C) or greater. You develop worsening pain at or near  the incision site. MAKE SURE YOU:  Understand these instructions. Will watch your condition. Will get help right  away if you are not doing well or get worse. Document Released: 08/31/2006 Document Revised: 07/13/2011 Document Reviewed: 11/01/2006 [facility] Patient Information 2014 [place], [place].   ________________________________________________________________________

## 2021-03-24 ENCOUNTER — Encounter (HOSPITAL_COMMUNITY): Payer: Self-pay

## 2021-03-24 ENCOUNTER — Other Ambulatory Visit: Payer: Self-pay

## 2021-03-24 ENCOUNTER — Encounter (HOSPITAL_COMMUNITY)
Admission: RE | Admit: 2021-03-24 | Discharge: 2021-03-24 | Disposition: A | Discharge status: Home / Self Care | Payer: Medicare HMO | Source: Ambulatory Visit | Attending: Orthopedic Surgery | Admitting: Orthopedic Surgery

## 2021-03-24 VITALS — BP 167/74 | HR 6 | Temp 97.0°F | Resp 20 | Ht 62.0 in | Wt 203.0 lb

## 2021-03-24 DIAGNOSIS — Z01818 Encounter for other preprocedural examination: Secondary | ICD-10-CM | POA: Diagnosis not present

## 2021-03-24 DIAGNOSIS — E119 Type 2 diabetes mellitus without complications: Secondary | ICD-10-CM | POA: Insufficient documentation

## 2021-03-24 LAB — COMPREHENSIVE METABOLIC PANEL
ALT: 22 U/L (ref 0–44)
AST: 25 U/L (ref 15–41)
Albumin: 4.2 g/dL (ref 3.5–5.0)
Alkaline Phosphatase: 60 U/L (ref 38–126)
Anion gap: 8 (ref 5–15)
BUN: 20 mg/dL (ref 8–23)
CO2: 26 mmol/L (ref 22–32)
Calcium: 9.5 mg/dL (ref 8.9–10.3)
Chloride: 106 mmol/L (ref 98–111)
Creatinine, Ser: 1.56 mg/dL — ABNORMAL HIGH (ref 0.44–1.00)
GFR, Estimated: 35 mL/min — ABNORMAL LOW (ref >60)
Glucose, Bld: 86 mg/dL (ref 70–99)
Potassium: 4.5 mmol/L (ref 3.5–5.1)
Sodium: 140 mmol/L (ref 135–145)
Total Bilirubin: 0.7 mg/dL (ref 0.3–1.2)
Total Protein: 7.3 g/dL (ref 6.5–8.1)

## 2021-03-24 LAB — GLUCOSE, CAPILLARY: Glucose-Capillary: 79 mg/dL (ref 70–99)

## 2021-03-24 LAB — CBC
HCT: 42.3 % (ref 36.0–46.0)
Hemoglobin: 14 g/dL (ref 12.0–15.0)
MCH: 32.3 pg (ref 26.0–34.0)
MCHC: 33.1 g/dL (ref 30.0–36.0)
MCV: 97.7 fL (ref 80.0–100.0)
Platelets: 249 10*3/uL (ref 150–400)
RBC: 4.33 MIL/uL (ref 3.87–5.11)
RDW: 14.3 % (ref 11.5–15.5)
WBC: 4.3 10*3/uL (ref 4.0–10.5)
nRBC: 0 % (ref 0.0–0.2)

## 2021-03-24 LAB — SURGICAL PCR SCREEN
MRSA, PCR: NEGATIVE
Staphylococcus aureus: NEGATIVE

## 2021-03-24 LAB — HEMOGLOBIN A1C
Hgb A1c MFr Bld: 6.1 % — ABNORMAL HIGH (ref 4.8–5.6)
Mean Plasma Glucose: 128.37 mg/dL

## 2021-03-24 LAB — PROTIME-INR
INR: 1 (ref 0.8–1.2)
Prothrombin Time: 12.8 seconds (ref 11.4–15.2)

## 2021-03-24 NOTE — Progress Notes (Signed)
COVID test- DOS   PCP - Dr. Lavina Hamman Cardiologist - no ne  Chest x-ray - no EKG - 03/24/21-chart Stress Test -no  ECHO - no Cardiac Cath - no Pacemaker/ICD device last checked:NA  Sleep Study - no CPAP -   Fasting Blood Sugar - 97-127 Checks Blood Sugar _____ times a day. 1-2 times a week  Blood Thinner Instructions:NA Aspirin Instructions: Last Dose:  Anesthesia review: yes  Patient denies shortness of breath, fever, cough and chest pain at PAT appointment Pt hasn't been climbing stairs but reports no SOB doing housework or with ADLs.  Patient verbalized understanding of instructions that were given to them at the PAT appointment. Patient was also instructed that they will need to review over the PAT instructions again at home before surgery. yes

## 2021-03-30 NOTE — H&P (Signed)
TOTAL KNEE ADMISSION H&P  Patient is being admitted for left total knee arthroplasty.  Subjective:  Chief Complaint: Left knee pain.  HPI: Christina [name], 70 y.o. female has a history of pain and functional disability in the left knee due to arthritis and has failed non-surgical conservative treatments for greater than 12 weeks to include NSAID's and/or analgesics, flexibility and strengthening excercises, and activity modification. Onset of symptoms was gradual, starting  several  years ago with gradually worsening course since that time. The patient noted no past surgery on the left knee.  Patient currently rates pain in the left knee at 7 out of 10 with activity. Patient has worsening of pain with activity and weight bearing, pain that interferes with activities of daily living, pain with passive range of motion, and crepitus. Patient has evidence of periarticular osteophytes and joint space narrowing by imaging studies. There is no active infection.  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   Hardening of the aorta (main artery of the heart) ([place]) 05/08/2020   Hypercholesterolemia 05/08/2020   Morbid obesity ([place]) 05/08/2020   Myalgia 05/08/2020   Osteopenia 05/08/2020   Sciatica 05/08/2020   Urgency of urination 05/08/2020   Urinary incontinence 05/08/2020   Statin not tolerated 05/08/2020   Adhesive capsulitis of left shoulder 08/08/2019   Mass of joint of left hand 02/15/2019   Mass of joint of left wrist 02/15/2019   Abdominal pain, vomiting, and diarrhea 01/12/2019   Renal insufficiency 01/12/2019   Adrenal nodule ([place])  01/12/2019   Pain in thumb joint with movement, right 12/28/2018   Pain of left hand 12/28/2018   Degenerative scoliosis 05/23/2018   Degenerative spondylolisthesis 05/23/2018   Constipation 09/07/2013   Surgical wound infection 08/08/2013   Diabetes mellitus without complication ([place])    Hypertension    Osteoarthritis of right knee 08/02/2013   Total knee replacement status 08/02/2013   History of colon cancer 06/19/2010    Past Medical History:  Diagnosis Date   Arthritis    OA RIGHT KNEE AND PAIN   Cancer ([place]) 05/04/2009   HX OF COLON CANCER; S/P SURGERY AND DID NOT HAVE TO HAVE CHEMO OR RADIATION   Diabetes mellitus without complication ([place])    Hypertension     Past Surgical History:  Procedure Laterality Date   BREAST SURGERY     RIGHT AND LEFT BREAST TUMORS REMOVED - BENIGN   C-SECTIONS X 2  1981   1991   COLON SURGERY  2015   CYST EXCISION Left 2020   hand and ganglian cyst and 2021 Lt arm   FATTY TUMOR REMOVED FROM RIGHT HEEL     age 79   TONSILLECTOMY     AT AGE OF 12   TOTAL KNEE ARTHROPLASTY Right 08/02/2013   Procedure: RIGHT TOTAL KNEE ARTHROPLASTY;  Surgeon: Tobi Bastos, MD;  Location: [facility] ORS;  Service: Orthopedics;  Laterality: Right;    Prior to Admission medications   Medication Sig Start Date End Date Taking? Authorizing Provider  amLODipine-benazepril (LOTREL) 10-40 MG capsule Take 1 capsule by mouth daily. 12/26/18  Yes [provider]  APPLE CIDER  VINEGAR PO Take 3 tablets by mouth daily. [place] Gummies   Yes [provider]  Calcium Carb-Cholecalciferol (CALCIUM + D3) 600-800 MG-UNIT TABS Take 1 tablet by mouth daily.   Yes [provider]  cholecalciferol (VITAMIN D3) 25 MCG (1000 UNIT) tablet Take 1,000 Units by mouth daily.   Yes [provider]  metFORMIN (GLUCOPHAGE-XR) 500 MG 24 hr tablet Take 1,000 mg by mouth daily with breakfast. 04/12/19  Yes [provider]  Vibegron 75 MG TABS Take 75 mg  by mouth daily.   Yes [provider]  ACCU-CHEK AVIVA PLUS test strip  01/18/19   [provider]  Accu-Chek Softclix Lancets lancets  01/17/19   [provider]  Alcohol Swabs (B-D SINGLE USE SWABS REGULAR) PADS USE TO TEST BLOOD SUGAR ONCE A DAY    [provider]  Blood Glucose Calibration (ACCU-CHEK GUIDE CONTROL) LIQD See admin instructions. 09/03/20   [provider]  Blood Glucose Monitoring Suppl (ACCU-CHEK AVIVA PLUS) w/Device KIT  01/18/19   [provider]  metFORMIN (GLUCOPHAGE) 500 MG tablet Take 1 tablet (500 mg total) by mouth 2 (two) times daily with a meal. Patient not taking: Reported on 03/19/2021 01/14/19 01/14/20  Georgette Shell, MD  ondansetron (ZOFRAN) 4 MG tablet Take 1 tablet (4 mg total) by mouth every 6 (six) hours as needed for nausea. Patient not taking: No sig reported 01/14/19   Georgette Shell, MD    Allergies  Allergen Reactions   Atorvastatin     Other reaction(s): Cramps (ALLERGY/intolerance), Other   Penicillins Dermatitis and Rash    nails peels   Rosuvastatin     Other reaction(s): Cramps & muscle pain   Sulfa Antibiotics Nausea And Vomiting    Other reaction(s): Muscle Cramps   Cholestyramine Diarrhea   Gemfibrozil Diarrhea and Nausea And Vomiting    Social History   Socioeconomic History   Marital status: Married    Spouse name: Not on file   Number of children: Not on file   Years of education: Not on file   Highest education level: Not on file  Occupational History   Not on file  Tobacco Use   Smoking status: Never   Smokeless tobacco: Never  Vaping Use   Vaping Use: Never used  Substance and Sexual Activity   Alcohol use: No   Drug use: No   Sexual activity: Not on file  Other Topics Concern   Not on file  Social History Narrative   Not on file   Social Determinants of Health   Financial Resource Strain: Not on file  Food Insecurity: Not on file  Transportation  Needs: Not on file  Physical Activity: Not on file  Stress: Not on file  Social Connections: Not on file  Intimate Partner Violence: Not on file    Tobacco Use: Low Risk    Smoking Tobacco Use: Never   Smokeless Tobacco Use: Never   Passive Exposure: Not on file   Social History   Substance and Sexual Activity  Alcohol Use No    Family History  Problem Relation Age of Onset   Hypertension Mother    Heart attack Mother     ROS: Constitutional: no fever, no chills, no night sweats, no significant weight loss Cardiovascular: no chest pain, no palpitations Respiratory: no cough, no shortness of breath, No COPD Gastrointestinal: no vomiting, no nausea Musculoskeletal: no swelling in Joints, Joint Pain Neurologic: no numbness, no tingling, no difficulty with balance  Objective:  Physical Exam: Well nourished and well developed.  General: Alert and oriented x3, cooperative and pleasant, no acute distress.  Head: normocephalic, atraumatic, neck supple.  Eyes: EOMI.  Respiratory: breath sounds clear in all fields, no wheezing, rales, or rhonchi. Cardiovascular: Regular rate and rhythm, no murmurs, gallops or rubs.  Abdomen: non-tender to palpation and soft, normoactive bowel sounds. Musculoskeletal:  Right Hip Exam:  The range of motion: Flexion to 120 degrees, Internal Rotation to 30 degrees, External Rotation to 40 degrees, and abduction to 40 degrees without discomfort.  There is positive tenderness over the greater trochanteric bursa. Palpation reproduces the pain she is experiencing.  There is no pain on provocative testing of the hip.    Right Knee Exam:  Well-healed scar from previous arthroplasty.  No effusion present. No swelling present.  The range of motion is: 0 to 110 degrees.  No crepitus on range of motion of the knee.  No medial joint line tenderness.  No lateral joint line tenderness.  The knee is stable.    Left Hip Exam:  The range of motion:  normal without discomfort.  There is no tenderness over the greater trochanter bursa.    Left Knee Exam:  No effusion present. No swelling present.  The Range of motion is: 0 to 115 degrees.  Moderate crepitus on range of motion of the knee.  Positive medial joint line tenderness  No lateral joint line tenderness.  The knee is stable.    The patient's sensation and motor function are intact in their lower extremities. Their distal pulses are 2+. The bilateral calves are soft and non-tender.         Vital signs in last 24 hours:    Imaging Review Radiographs- AP of bilateral knees and lateral of the left knee dated 06/13/2020 demonstrate [employer] total knee on the right. It is in good position with no periprosthetic abnormalities. On the left, she is bone-on-bone medially and near bone-on-bone patellofemoral.    Radiographs- AP pelvis, AP and lateral of the right hip dated 06/13/2020 demonstrate no arthritis or acute or chronic bony abnormalities.   Assessment/Plan:  End stage arthritis, left knee   The patient history, physical examination, clinical judgment of the provider and imaging studies are consistent with end stage degenerative joint disease of the left knee and total knee arthroplasty is deemed medically necessary. The treatment options including medical management, injection therapy arthroscopy and arthroplasty were discussed at length. The risks and benefits of total knee arthroplasty were presented and reviewed. The risks due to aseptic loosening, infection, stiffness, patella tracking problems, thromboembolic complications and other imponderables were discussed. The patient acknowledged the explanation, agreed to proceed with the plan and consent was signed. Patient is being admitted for inpatient treatment for surgery, pain control, PT, OT, prophylactic antibiotics, VTE prophylaxis, progressive ambulation and ADLs and discharge planning.  The patient is planning to be discharged  home .   Patient's anticipated LOS is less than 2 midnights, meeting these requirements: - Younger than 22 - Lives within 1 hour of care - Has a competent adult at home to recover with post-op recover - NO history of  - Chronic pain requiring opiods  - Diabetes  - Coronary Artery Disease  - Heart failure  - Heart attack  - Stroke  - DVT/VTE  - Cardiac arrhythmia  - Respiratory Failure/COPD  - Renal failure  - Anemia  - Advanced Liver disease    Therapy Plans:  EO Disposition: Husband Planned DVT Prophylaxis: Xarelto 56m (Hx of Colon CA) DME Needed: None PCP: Dr. RHarrington Challenger(clearance received) TXA: IV Allergies: PCN (Possible SJS reaction); Statin Anesthesia Concerns: None BMI: 38.3 Last HgbA1c: 6.7  [facility] [address]  - Patient was instructed on what medications to stop prior to surgery. - Follow-up visit in 2 weeks with Dr. AWynelle Link- Begin physical therapy following surgery - Pre-operative lab work as pre-surgical testing - Prescriptions will be provided in hospital at time of discharge  SFenton Foy [facility] PA-C Orthopedic Surgery [facility] [place]

## 2021-03-30 NOTE — Anesthesia Preprocedure Evaluation (Addendum)
Anesthesia Evaluation  Patient identified by MRN, date of birth, ID band Patient awake    Reviewed: Allergy & Precautions, NPO status , Patient's Chart, lab work & pertinent test results  Airway Mallampati: II  TM Distance: >3 FB Neck ROM: Full    Dental no notable dental hx. (+) Teeth Intact, Dental Advisory Given   Pulmonary    Pulmonary exam normal breath sounds clear to auscultation       Cardiovascular hypertension, Pt. on medications Normal cardiovascular exam Rhythm:Regular Rate:Normal  03/24/21 EKG SR R 76   Neuro/Psych    GI/Hepatic negative GI ROS, Neg liver ROS,   Endo/Other  diabetes, Type 2  Renal/GU Renal InsufficiencyRenal diseaseLab Results      Component                Value               Date                      CREATININE               1.56 (H)            03/24/2021                BUN                      20                  03/24/2021                NA                       140                 03/24/2021                K                        4.5                 03/24/2021                CL                       106                 03/24/2021                CO2                      26                  03/24/2021                Musculoskeletal  (+) Arthritis , Osteoarthritis,    Abdominal (+) + obese (BMI 37.13),   Peds  Hematology Lab Results      Component                Value               Date                      WBC  4.3                 03/24/2021                HGB                      14.0                03/24/2021                HCT                      42.3                03/24/2021                MCV                      97.7                03/24/2021                PLT                      249                 03/24/2021              Anesthesia Other Findings ALL: PCN. Atorvastatin, Rosuvastatin, slufa  Reproductive/Obstetrics                             Anesthesia Physical Anesthesia Plan  ASA: 3  Anesthesia Plan: Spinal and Regional   Post-op Pain Management: Regional block and Minimal or no pain anticipated   Induction:   PONV Risk Score and Plan: 2 and Treatment may vary due to age or medical condition, Ondansetron and Midazolam  Airway Management Planned: Natural Airway and Simple Face Mask  Additional Equipment:   Intra-op Plan:   Post-operative Plan:   Informed Consent: I have reviewed the patients History and Physical, chart, labs and discussed the procedure including the risks, benefits and alternatives for the proposed anesthesia with the patient or authorized representative who has indicated his/her understanding and acceptance.     Dental advisory given  Plan Discussed with: CRNA and Anesthesiologist  Anesthesia Plan Comments: (L TKA under Spinal w L Adductor canal)       Anesthesia Quick Evaluation

## 2021-03-31 ENCOUNTER — Ambulatory Visit (HOSPITAL_COMMUNITY): Payer: Medicare HMO | Admitting: Anesthesiology

## 2021-03-31 ENCOUNTER — Inpatient Hospital Stay (HOSPITAL_COMMUNITY)
Admission: AD | Admit: 2021-03-31 | Discharge: 2021-04-04 | DRG: 470 | Disposition: A | Discharge status: Home / Self Care | Payer: Medicare HMO | Attending: Orthopedic Surgery | Admitting: Orthopedic Surgery

## 2021-03-31 ENCOUNTER — Encounter (HOSPITAL_COMMUNITY)
Admission: AD | Disposition: A | Discharge status: Home / Self Care | Payer: Self-pay | Source: Home / Self Care | Attending: Orthopedic Surgery

## 2021-03-31 ENCOUNTER — Ambulatory Visit (HOSPITAL_COMMUNITY): Payer: Medicare HMO | Admitting: Physician Assistant

## 2021-03-31 ENCOUNTER — Other Ambulatory Visit: Payer: Self-pay

## 2021-03-31 ENCOUNTER — Encounter (HOSPITAL_COMMUNITY): Payer: Self-pay | Admitting: Orthopedic Surgery

## 2021-03-31 DIAGNOSIS — G720 Drug-induced myopathy: Secondary | ICD-10-CM | POA: Diagnosis present

## 2021-03-31 DIAGNOSIS — E7800 Pure hypercholesterolemia, unspecified: Secondary | ICD-10-CM | POA: Diagnosis not present

## 2021-03-31 DIAGNOSIS — Z96651 Presence of right artificial knee joint: Secondary | ICD-10-CM | POA: Diagnosis present

## 2021-03-31 DIAGNOSIS — E669 Obesity, unspecified: Secondary | ICD-10-CM | POA: Diagnosis present

## 2021-03-31 DIAGNOSIS — E119 Type 2 diabetes mellitus without complications: Secondary | ICD-10-CM | POA: Diagnosis present

## 2021-03-31 DIAGNOSIS — Z882 Allergy status to sulfonamides status: Secondary | ICD-10-CM | POA: Diagnosis not present

## 2021-03-31 DIAGNOSIS — G8918 Other acute postprocedural pain: Secondary | ICD-10-CM | POA: Diagnosis not present

## 2021-03-31 DIAGNOSIS — I10 Essential (primary) hypertension: Secondary | ICD-10-CM | POA: Diagnosis present

## 2021-03-31 DIAGNOSIS — Z7984 Long term (current) use of oral hypoglycemic drugs: Secondary | ICD-10-CM | POA: Diagnosis not present

## 2021-03-31 DIAGNOSIS — M8580 Other specified disorders of bone density and structure, unspecified site: Secondary | ICD-10-CM | POA: Diagnosis present

## 2021-03-31 DIAGNOSIS — Z880 Allergy status to penicillin: Secondary | ICD-10-CM | POA: Diagnosis not present

## 2021-03-31 DIAGNOSIS — Z85038 Personal history of other malignant neoplasm of large intestine: Secondary | ICD-10-CM | POA: Diagnosis not present

## 2021-03-31 DIAGNOSIS — M791 Myalgia, unspecified site: Secondary | ICD-10-CM | POA: Diagnosis present

## 2021-03-31 DIAGNOSIS — Z79899 Other long term (current) drug therapy: Secondary | ICD-10-CM

## 2021-03-31 DIAGNOSIS — M179 Osteoarthritis of knee, unspecified: Secondary | ICD-10-CM

## 2021-03-31 DIAGNOSIS — Z20822 Contact with and (suspected) exposure to covid-19: Secondary | ICD-10-CM | POA: Diagnosis present

## 2021-03-31 DIAGNOSIS — Z888 Allergy status to other drugs, medicaments and biological substances status: Secondary | ICD-10-CM

## 2021-03-31 DIAGNOSIS — G8929 Other chronic pain: Secondary | ICD-10-CM | POA: Diagnosis present

## 2021-03-31 DIAGNOSIS — Z8249 Family history of ischemic heart disease and other diseases of the circulatory system: Secondary | ICD-10-CM | POA: Diagnosis not present

## 2021-03-31 DIAGNOSIS — Z6837 Body mass index (BMI) 37.0-37.9, adult: Secondary | ICD-10-CM | POA: Diagnosis not present

## 2021-03-31 DIAGNOSIS — Z01818 Encounter for other preprocedural examination: Secondary | ICD-10-CM

## 2021-03-31 DIAGNOSIS — M1712 Unilateral primary osteoarthritis, left knee: Principal | ICD-10-CM

## 2021-03-31 HISTORY — PX: TOTAL KNEE ARTHROPLASTY: SHX125

## 2021-03-31 LAB — GLUCOSE, CAPILLARY
Glucose-Capillary: 94 mg/dL (ref 70–99)
Glucose-Capillary: 85 mg/dL (ref 70–99)
Glucose-Capillary: 269 mg/dL — ABNORMAL HIGH (ref 70–99)
Glucose-Capillary: 197 mg/dL — ABNORMAL HIGH (ref 70–99)

## 2021-03-31 LAB — TYPE AND SCREEN
ABO/RH(D): B NEG
Antibody Screen: NEGATIVE

## 2021-03-31 LAB — SARS CORONAVIRUS 2 BY RT PCR (HOSPITAL ORDER, PERFORMED IN ~~LOC~~ HOSPITAL LAB): SARS Coronavirus 2: NEGATIVE

## 2021-03-31 SURGERY — ARTHROPLASTY, KNEE, TOTAL
Anesthesia: Regional | Site: Knee | Laterality: Left

## 2021-03-31 MED ORDER — ROPIVACAINE HCL 5 MG/ML IJ SOLN
INTRAMUSCULAR | Status: DC | PRN
  Administered 2021-03-31: 30 mL via PERINEURAL

## 2021-03-31 MED ORDER — DEXAMETHASONE SODIUM PHOSPHATE 10 MG/ML IJ SOLN
INTRAMUSCULAR | Status: DC | PRN
  Administered 2021-03-31: 4 mg via INTRAVENOUS

## 2021-03-31 MED ORDER — BUPIVACAINE IN DEXTROSE 0.75-8.25 % IT SOLN
INTRATHECAL | Status: DC | PRN
  Administered 2021-03-31: 1.6 mL via INTRATHECAL

## 2021-03-31 MED ORDER — METOCLOPRAMIDE HCL 5 MG PO TABS: 5.0000 mg | ORAL_TABLET | Freq: Three times a day (TID) | ORAL | Status: DC | PRN

## 2021-03-31 MED ORDER — RIVAROXABAN 10 MG PO TABS
10.0000 mg | ORAL_TABLET | Freq: Every day | ORAL | Status: DC
  Administered 2021-04-01 – 2021-04-04 (×4): 10 mg via ORAL
  Filled 2021-03-31 (×4): qty 1

## 2021-03-31 MED ORDER — PHENOL 1.4 % MT LIQD: 1.0000 | OROMUCOSAL | Status: DC | PRN

## 2021-03-31 MED ORDER — DEXAMETHASONE SODIUM PHOSPHATE 10 MG/ML IJ SOLN: 8.0000 mg | Freq: Once | INTRAMUSCULAR | Status: AC

## 2021-03-31 MED ORDER — INSULIN ASPART 100 UNIT/ML IJ SOLN: 0.0000 [IU] | Freq: Every day | INTRAMUSCULAR | Status: DC

## 2021-03-31 MED ORDER — METHOCARBAMOL 500 MG IVPB - SIMPLE MED
INTRAVENOUS | Status: AC
  Filled 2021-03-31: qty 50

## 2021-03-31 MED ORDER — FENTANYL CITRATE PF 50 MCG/ML IJ SOSY: 25.0000 ug | PREFILLED_SYRINGE | INTRAMUSCULAR | Status: DC | PRN

## 2021-03-31 MED ORDER — LACTATED RINGERS IV SOLN: INTRAVENOUS | Status: DC

## 2021-03-31 MED ORDER — MORPHINE SULFATE (PF) 2 MG/ML IV SOLN
0.5000 mg | INTRAVENOUS | Status: DC | PRN
Start: 2021-03-31 — End: 2021-04-05
  Administered 2021-04-01: 0.5 mg via INTRAVENOUS
  Filled 2021-03-31: qty 1

## 2021-03-31 MED ORDER — POVIDONE-IODINE 10 % EX SWAB
2.0000 "application " | Freq: Once | CUTANEOUS | Status: AC
  Administered 2021-03-31: 2 via TOPICAL

## 2021-03-31 MED ORDER — FLEET ENEMA 7-19 GM/118ML RE ENEM: 1.0000 | ENEMA | Freq: Once | RECTAL | Status: DC | PRN

## 2021-03-31 MED ORDER — GABAPENTIN 300 MG PO CAPS
300.0000 mg | ORAL_CAPSULE | Freq: Three times a day (TID) | ORAL | Status: DC
  Administered 2021-03-31 – 2021-04-04 (×13): 300 mg via ORAL
  Filled 2021-03-31 (×13): qty 1

## 2021-03-31 MED ORDER — METOCLOPRAMIDE HCL 5 MG/ML IJ SOLN: 5.0000 mg | Freq: Three times a day (TID) | INTRAMUSCULAR | Status: DC | PRN

## 2021-03-31 MED ORDER — 0.9 % SODIUM CHLORIDE (POUR BTL) OPTIME
TOPICAL | Status: DC | PRN
  Administered 2021-03-31: 10:00:00 1000 mL

## 2021-03-31 MED ORDER — TRAMADOL HCL 50 MG PO TABS
50.0000 mg | ORAL_TABLET | Freq: Four times a day (QID) | ORAL | Status: DC | PRN
  Administered 2021-03-31 – 2021-04-02 (×3): 100 mg via ORAL
  Administered 2021-04-04: 50 mg via ORAL
  Filled 2021-03-31 (×3): qty 2
  Filled 2021-03-31: qty 1

## 2021-03-31 MED ORDER — ONDANSETRON HCL 4 MG/2ML IJ SOLN
INTRAMUSCULAR | Status: DC | PRN
  Administered 2021-03-31: 4 mg via INTRAVENOUS

## 2021-03-31 MED ORDER — LIDOCAINE HCL (PF) 2 % IJ SOLN
INTRAMUSCULAR | Status: AC
  Filled 2021-03-31: qty 5

## 2021-03-31 MED ORDER — CHLORHEXIDINE GLUCONATE 0.12 % MT SOLN
15.0000 mL | Freq: Once | OROMUCOSAL | Status: AC
  Administered 2021-03-31: 08:00:00 15 mL via OROMUCOSAL

## 2021-03-31 MED ORDER — CEFAZOLIN SODIUM-DEXTROSE 2-4 GM/100ML-% IV SOLN
2.0000 g | Freq: Four times a day (QID) | INTRAVENOUS | Status: AC
  Administered 2021-03-31 (×2): 2 g via INTRAVENOUS
  Filled 2021-03-31 (×2): qty 100

## 2021-03-31 MED ORDER — PROPOFOL 500 MG/50ML IV EMUL
INTRAVENOUS | Status: DC | PRN
  Administered 2021-03-31: 75 ug/kg/min via INTRAVENOUS

## 2021-03-31 MED ORDER — DEXAMETHASONE SODIUM PHOSPHATE 10 MG/ML IJ SOLN
INTRAMUSCULAR | Status: AC
  Filled 2021-03-31: qty 1

## 2021-03-31 MED ORDER — ACETAMINOPHEN 10 MG/ML IV SOLN
1000.0000 mg | Freq: Four times a day (QID) | INTRAVENOUS | Status: DC
  Administered 2021-03-31: 10:00:00 1000 mg via INTRAVENOUS
  Filled 2021-03-31: qty 100

## 2021-03-31 MED ORDER — ACETAMINOPHEN 10 MG/ML IV SOLN: 1000.0000 mg | Freq: Once | INTRAVENOUS | Status: DC | PRN

## 2021-03-31 MED ORDER — OXYCODONE HCL 5 MG PO TABS
5.0000 mg | ORAL_TABLET | ORAL | Status: DC | PRN
  Administered 2021-03-31 – 2021-04-04 (×18): 10 mg via ORAL
  Filled 2021-03-31 (×20): qty 2

## 2021-03-31 MED ORDER — LIDOCAINE 2% (20 MG/ML) 5 ML SYRINGE
INTRAMUSCULAR | Status: DC | PRN
  Administered 2021-03-31: 40 mg via INTRAVENOUS

## 2021-03-31 MED ORDER — ONDANSETRON HCL 4 MG/2ML IJ SOLN: 4.0000 mg | Freq: Once | INTRAMUSCULAR | Status: DC | PRN

## 2021-03-31 MED ORDER — POLYETHYLENE GLYCOL 3350 17 G PO PACK
17.0000 g | PACK | Freq: Every day | ORAL | Status: DC | PRN
  Administered 2021-04-03: 17 g via ORAL
  Filled 2021-03-31: qty 1

## 2021-03-31 MED ORDER — SODIUM CHLORIDE 0.9 % IR SOLN
Status: DC | PRN
  Administered 2021-03-31: 1000 mL

## 2021-03-31 MED ORDER — ONDANSETRON HCL 4 MG/2ML IJ SOLN: 4.0000 mg | Freq: Four times a day (QID) | INTRAMUSCULAR | Status: DC | PRN

## 2021-03-31 MED ORDER — BUPIVACAINE LIPOSOME 1.3 % IJ SUSP
INTRAMUSCULAR | Status: DC | PRN
  Administered 2021-03-31: 20 mL

## 2021-03-31 MED ORDER — METHOCARBAMOL 500 MG PO TABS
500.0000 mg | ORAL_TABLET | Freq: Four times a day (QID) | ORAL | Status: DC | PRN
  Administered 2021-03-31 – 2021-04-04 (×12): 500 mg via ORAL
  Filled 2021-03-31 (×12): qty 1

## 2021-03-31 MED ORDER — INSULIN ASPART 100 UNIT/ML IJ SOLN
0.0000 [IU] | Freq: Three times a day (TID) | INTRAMUSCULAR | Status: DC
  Administered 2021-03-31: 17:00:00 8 [IU] via SUBCUTANEOUS
  Administered 2021-04-01 – 2021-04-02 (×5): 2 [IU] via SUBCUTANEOUS
  Administered 2021-04-02: 3 [IU] via SUBCUTANEOUS
  Administered 2021-04-03: 2 [IU] via SUBCUTANEOUS

## 2021-03-31 MED ORDER — FENTANYL CITRATE PF 50 MCG/ML IJ SOSY
PREFILLED_SYRINGE | INTRAMUSCULAR | Status: AC
  Filled 2021-03-31: qty 2

## 2021-03-31 MED ORDER — CEFAZOLIN SODIUM-DEXTROSE 2-4 GM/100ML-% IV SOLN
2.0000 g | INTRAVENOUS | Status: AC
  Administered 2021-03-31: 10:00:00 2 g via INTRAVENOUS
  Filled 2021-03-31: qty 100

## 2021-03-31 MED ORDER — ONDANSETRON HCL 4 MG/2ML IJ SOLN
INTRAMUSCULAR | Status: AC
  Filled 2021-03-31: qty 2

## 2021-03-31 MED ORDER — CLONIDINE HCL (ANALGESIA) 100 MCG/ML EP SOLN
EPIDURAL | Status: DC | PRN
  Administered 2021-03-31: 100 ug

## 2021-03-31 MED ORDER — EPHEDRINE SULFATE-NACL 50-0.9 MG/10ML-% IV SOSY
PREFILLED_SYRINGE | INTRAVENOUS | Status: DC | PRN
  Administered 2021-03-31: 10 mg via INTRAVENOUS

## 2021-03-31 MED ORDER — ORAL CARE MOUTH RINSE: 15.0000 mL | Freq: Once | OROMUCOSAL | Status: AC

## 2021-03-31 MED ORDER — SODIUM CHLORIDE (PF) 0.9 % IJ SOLN
INTRAMUSCULAR | Status: AC
  Filled 2021-03-31: qty 10

## 2021-03-31 MED ORDER — BUPIVACAINE LIPOSOME 1.3 % IJ SUSP
INTRAMUSCULAR | Status: AC
  Filled 2021-03-31: qty 20

## 2021-03-31 MED ORDER — MIDAZOLAM HCL 2 MG/2ML IJ SOLN
1.0000 mg | INTRAMUSCULAR | Status: DC
  Filled 2021-03-31: qty 2

## 2021-03-31 MED ORDER — METHOCARBAMOL 500 MG IVPB - SIMPLE MED
500.0000 mg | Freq: Four times a day (QID) | INTRAVENOUS | Status: DC | PRN
  Filled 2021-03-31: qty 50

## 2021-03-31 MED ORDER — VIBEGRON 75 MG PO TABS: 75.0000 mg | ORAL_TABLET | Freq: Every day | ORAL | Status: DC

## 2021-03-31 MED ORDER — MENTHOL 3 MG MT LOZG
1.0000 | LOZENGE | OROMUCOSAL | Status: DC | PRN
  Administered 2021-03-31: 21:00:00 3 mg via ORAL
  Filled 2021-03-31: qty 9

## 2021-03-31 MED ORDER — EPHEDRINE 5 MG/ML INJ
INTRAVENOUS | Status: AC
  Filled 2021-03-31: qty 5

## 2021-03-31 MED ORDER — DOCUSATE SODIUM 100 MG PO CAPS
100.0000 mg | ORAL_CAPSULE | Freq: Two times a day (BID) | ORAL | Status: DC
  Administered 2021-03-31 – 2021-04-04 (×9): 100 mg via ORAL
  Filled 2021-03-31 (×9): qty 1

## 2021-03-31 MED ORDER — SODIUM CHLORIDE 0.9 % IV SOLN: INTRAVENOUS | Status: DC

## 2021-03-31 MED ORDER — PROPOFOL 1000 MG/100ML IV EMUL
INTRAVENOUS | Status: AC
  Filled 2021-03-31: qty 100

## 2021-03-31 MED ORDER — AMLODIPINE BESYLATE 10 MG PO TABS
10.0000 mg | ORAL_TABLET | Freq: Every day | ORAL | Status: DC
  Administered 2021-04-01 – 2021-04-04 (×4): 10 mg via ORAL
  Filled 2021-03-31 (×4): qty 1

## 2021-03-31 MED ORDER — TRANEXAMIC ACID-NACL 1000-0.7 MG/100ML-% IV SOLN
1000.0000 mg | INTRAVENOUS | Status: AC
  Administered 2021-03-31: 10:00:00 1000 mg via INTRAVENOUS
  Filled 2021-03-31: qty 100

## 2021-03-31 MED ORDER — FENTANYL CITRATE PF 50 MCG/ML IJ SOSY
50.0000 ug | PREFILLED_SYRINGE | INTRAMUSCULAR | Status: DC
  Administered 2021-03-31: 09:00:00 50 ug via INTRAVENOUS
  Filled 2021-03-31: qty 2

## 2021-03-31 MED ORDER — STERILE WATER FOR IRRIGATION IR SOLN
Status: DC | PRN
  Administered 2021-03-31: 2000 mL

## 2021-03-31 MED ORDER — DIPHENHYDRAMINE HCL 12.5 MG/5ML PO ELIX: 12.5000 mg | ORAL_SOLUTION | ORAL | Status: DC | PRN

## 2021-03-31 MED ORDER — PHENYLEPHRINE HCL-NACL 20-0.9 MG/250ML-% IV SOLN
INTRAVENOUS | Status: DC | PRN
  Administered 2021-03-31: 20 ug/min via INTRAVENOUS

## 2021-03-31 MED ORDER — BISACODYL 10 MG RE SUPP: 10.0000 mg | Freq: Every day | RECTAL | Status: DC | PRN

## 2021-03-31 MED ORDER — ONDANSETRON HCL 4 MG PO TABS: 4.0000 mg | ORAL_TABLET | Freq: Four times a day (QID) | ORAL | Status: DC | PRN

## 2021-03-31 MED ORDER — SODIUM CHLORIDE (PF) 0.9 % IJ SOLN
INTRAMUSCULAR | Status: DC | PRN
  Administered 2021-03-31: 60 mL

## 2021-03-31 MED ORDER — BUPIVACAINE LIPOSOME 1.3 % IJ SUSP: 20.0000 mL | Freq: Once | INTRAMUSCULAR | Status: AC

## 2021-03-31 MED ORDER — ACETAMINOPHEN 500 MG PO TABS
1000.0000 mg | ORAL_TABLET | Freq: Four times a day (QID) | ORAL | Status: AC
  Administered 2021-03-31 – 2021-04-01 (×3): 1000 mg via ORAL
  Filled 2021-03-31 (×4): qty 2

## 2021-03-31 SURGICAL SUPPLY — 52 items
ATTUNE PS FEM LT SZ 4 CEM KNEE (Femur) ×1 IMPLANT
ATTUNE PSRP INSR SZ4 12 KNEE (Insert) ×1 IMPLANT
BAG COUNTER SPONGE SURGICOUNT (BAG) IMPLANT
BAG SPEC THK2 15X12 ZIP CLS (MISCELLANEOUS) ×1
BAG SPNG CNTER NS LX DISP (BAG)
BAG ZIPLOCK 12X15 (MISCELLANEOUS) ×2 IMPLANT
BASEPLATE TIBIAL ROTATING SZ 4 (Knees) ×1 IMPLANT
BLADE SAG 18X100X1.27 (BLADE) ×2 IMPLANT
BLADE SAW SGTL 11.0X1.19X90.0M (BLADE) ×2 IMPLANT
BNDG ELASTIC 6X5.8 VLCR STR LF (GAUZE/BANDAGES/DRESSINGS) ×2 IMPLANT
BOWL SMART MIX CTS (DISPOSABLE) ×2 IMPLANT
BSPLAT TIB 4 CMNT ROT PLAT STR (Knees) ×1 IMPLANT
CEMENT HV SMART SET (Cement) ×4 IMPLANT
COVER SURGICAL LIGHT HANDLE (MISCELLANEOUS) ×2 IMPLANT
CUFF TOURN SGL QUICK 34 (TOURNIQUET CUFF) ×2
CUFF TRNQT CYL 34X4.125X (TOURNIQUET CUFF) ×1 IMPLANT
DECANTER SPIKE VIAL GLASS SM (MISCELLANEOUS) ×2 IMPLANT
DRAPE INCISE IOBAN 66X45 STRL (DRAPES) ×2 IMPLANT
DRAPE U-SHAPE 47X51 STRL (DRAPES) ×2 IMPLANT
DRSG AQUACEL AG ADV 3.5X10 (GAUZE/BANDAGES/DRESSINGS) ×2 IMPLANT
DURAPREP 26ML APPLICATOR (WOUND CARE) ×2 IMPLANT
ELECT REM PT RETURN 15FT ADLT (MISCELLANEOUS) ×2 IMPLANT
GLOVE SRG 8 PF TXTR STRL LF DI (GLOVE) ×1 IMPLANT
GLOVE SURG ENC MOIS LTX SZ6.5 (GLOVE) ×2 IMPLANT
GLOVE SURG ENC MOIS LTX SZ8 (GLOVE) ×4 IMPLANT
GLOVE SURG UNDER POLY LF SZ7 (GLOVE) ×2 IMPLANT
GLOVE SURG UNDER POLY LF SZ8 (GLOVE) ×2
GLOVE SURG UNDER POLY LF SZ8.5 (GLOVE) ×2 IMPLANT
GOWN STRL REUS W/TWL LRG LVL3 (GOWN DISPOSABLE) ×4 IMPLANT
GOWN STRL REUS W/TWL XL LVL3 (GOWN DISPOSABLE) ×2 IMPLANT
HANDPIECE INTERPULSE COAX TIP (DISPOSABLE) ×2
HOLDER FOLEY CATH W/STRAP (MISCELLANEOUS) IMPLANT
IMMOBILIZER KNEE 20 (SOFTGOODS) ×2
IMMOBILIZER KNEE 20 THIGH 36 (SOFTGOODS) ×1 IMPLANT
KIT TURNOVER KIT A (KITS) IMPLANT
MANIFOLD NEPTUNE II (INSTRUMENTS) ×2 IMPLANT
NS IRRIG 1000ML POUR BTL (IV SOLUTION) ×2 IMPLANT
PACK TOTAL KNEE CUSTOM (KITS) ×2 IMPLANT
PADDING CAST COTTON 6X4 STRL (CAST SUPPLIES) ×3 IMPLANT
PATELLA MEDIAL ATTUN 35MM KNEE (Knees) ×1 IMPLANT
PROTECTOR NERVE ULNAR (MISCELLANEOUS) ×2 IMPLANT
SET HNDPC FAN SPRY TIP SCT (DISPOSABLE) ×1 IMPLANT
STRIP CLOSURE SKIN 1/2X4 (GAUZE/BANDAGES/DRESSINGS) ×4 IMPLANT
SUT MNCRL AB 4-0 PS2 18 (SUTURE) ×2 IMPLANT
SUT STRATAFIX 0 PDS 27 VIOLET (SUTURE) ×2
SUT VIC AB 2-0 CT1 27 (SUTURE) ×6
SUT VIC AB 2-0 CT1 TAPERPNT 27 (SUTURE) ×3 IMPLANT
SUTURE STRATFX 0 PDS 27 VIOLET (SUTURE) ×1 IMPLANT
TRAY FOLEY MTR SLVR 16FR STAT (SET/KITS/TRAYS/PACK) ×2 IMPLANT
TUBE SUCTION HIGH CAP CLEAR NV (SUCTIONS) ×2 IMPLANT
WATER STERILE IRR 1000ML POUR (IV SOLUTION) ×4 IMPLANT
WRAP KNEE MAXI GEL POST OP (GAUZE/BANDAGES/DRESSINGS) ×2 IMPLANT

## 2021-03-31 NOTE — Anesthesia Procedure Notes (Signed)
Spinal  Patient location during procedure: OR Start time: 03/31/2021 9:36 AM End time: 03/31/2021 9:46 AM Reason for block: surgical anesthesia Staffing Performed: anesthesiologist  Anesthesiologist: Barnet Glasgow, MD Preanesthetic Checklist Completed: patient identified, IV checked, risks and benefits discussed, surgical consent, monitors and equipment checked, pre-op evaluation and timeout performed Spinal Block Patient position: sitting Prep: DuraPrep and site prepped and draped Patient monitoring: heart rate, cardiac monitor, continuous pulse ox and blood pressure Approach: midline Location: L3-4 Injection technique: single-shot Needle Needle type: Pencan  Needle gauge: 24 G Needle length: 10 cm Needle insertion depth: 7 cm Assessment Sensory level: T4 Events: CSF return and second provider Additional Notes  2Attempt (s) L4-5 1 attempt at L-34. Pt tolerated procedure well.

## 2021-03-31 NOTE — Progress Notes (Signed)
Orthopedic Tech Progress Note Patient Details:  Christina [name] 05-08-1950 [id number]  CPM Left Knee CPM Left Knee: On Left Knee Flexion (Degrees): 40 Left Knee Extension (Degrees): 10  Post Interventions Patient Tolerated: Well Instructions Provided: Care of device, Adjustment of device  [place] Pink 03/31/2021, 11:14 AM

## 2021-03-31 NOTE — Interval H&P Note (Signed)
History and Physical Interval Note:  03/31/2021 6:57 AM  Christina [name]  has presented today for surgery, with the diagnosis of left knee osteoarthritis.  The various methods of treatment have been discussed with the patient and family. After consideration of risks, benefits and other options for treatment, the patient has consented to  Procedure(s): TOTAL KNEE ARTHROPLASTY (Left) as a surgical intervention.  The patient's history has been reviewed, patient examined, no change in status, stable for surgery.  I have reviewed the patient's chart and labs.  Questions were answered to the patient's satisfaction.     Pilar Plate [name]

## 2021-03-31 NOTE — Discharge Instructions (Addendum)
Christina Arabian, MD Total Joint Specialist [facility] [place] [address], [address] [name], [name] [address] [phone]  TOTAL KNEE REPLACEMENT POSTOPERATIVE DIRECTIONS    Knee Rehabilitation, Guidelines Following Surgery  Results after knee surgery are often greatly improved when you follow the exercise, range of motion and muscle strengthening exercises prescribed by your doctor. Safety measures are also important to protect the knee from further injury. If any of these exercises cause you to have increased pain or swelling in your knee joint, decrease the amount until you are comfortable again and slowly increase them. If you have problems or questions, call your caregiver or physical therapist for advice.   BLOOD CLOT PREVENTION Take a 10 mg Xarelto once a day for three weeks following surgery. Then take an 81 mg Aspirin once a day for three weeks. Then discontinue Aspirin. You may resume your vitamins/supplements once you have discontinued the Xarelto. Do not take any NSAIDs (Advil, Aleve, Ibuprofen, Meloxicam, etc.) until you have discontinued the Xarelto.   HOME CARE INSTRUCTIONS  Remove items at home which could result in a fall. This includes throw rugs or furniture in walking pathways.  ICE to the affected knee as much as tolerated. Icing helps control swelling. If the swelling is well controlled you will be more comfortable and rehab easier. Continue to use ice on the knee for pain and swelling from surgery. You may notice swelling that will progress down to the foot and ankle. This is normal after surgery. Elevate the leg when you are not up walking on it.    Continue to use the breathing machine which will help keep your temperature down. It is common for your temperature to cycle up and down following surgery, especially at night when you are not up moving around and exerting yourself. The breathing machine keeps your lungs expanded and your temperature  down. Do not place pillow under the operative knee, focus on keeping the knee straight while resting  DIET You may resume your previous home diet once you are discharged from the hospital.  DRESSING / WOUND CARE / SHOWERING Keep your bulky bandage on for 2 days. On the third post-operative day you may remove the Ace bandage and gauze. There is a waterproof adhesive bandage on your skin which will stay in place until your first follow-up appointment. Once you remove this you will not need to place another bandage You may begin showering 3 days following surgery, but do not submerge the incision under water.  ACTIVITY For the first 5 days, the key is rest and control of pain and swelling Do your home exercises twice a day starting on post-operative day 3. On the days you go to physical therapy, just do the home exercises once that day. You should rest, ice and elevate the leg for 50 minutes out of every hour. Get up and walk/stretch for 10 minutes per hour. After 5 days you can increase your activity slowly as tolerated. Walk with your walker as instructed. Use the walker until you are comfortable transitioning to a cane. Walk with the cane in the opposite hand of the operative leg. You may discontinue the cane once you are comfortable and walking steadily. Avoid periods of inactivity such as sitting longer than an hour when not asleep. This helps prevent blood clots.  You may discontinue the knee immobilizer once you are able to perform a straight leg raise while lying down. You may resume a sexual relationship in one month or  when given the OK by your doctor.  You may return to work once you are cleared by your doctor.  Do not drive a car for 6 weeks or until released by your surgeon.  Do not drive while taking narcotics.  TED HOSE STOCKINGS Wear the elastic stockings on both legs for three weeks following surgery during the day. You may remove them at night for sleeping.  WEIGHT  BEARING Weight bearing as tolerated with assist device (walker, cane, etc) as directed, use it as long as suggested by your surgeon or therapist, typically at least 4-6 weeks.  POSTOPERATIVE CONSTIPATION PROTOCOL Constipation - defined medically as fewer than three stools per week and severe constipation as less than one stool per week.  One of the most common issues patients have following surgery is constipation.  Even if you have a regular bowel pattern at home, your normal regimen is likely to be disrupted due to multiple reasons following surgery.  Combination of anesthesia, postoperative narcotics, change in appetite and fluid intake all can affect your bowels.  In order to avoid complications following surgery, here are some recommendations in order to help you during your recovery period.  Colace (docusate) - Pick up an over-the-counter form of Colace or another stool softener and take twice a day as long as you are requiring postoperative pain medications.  Take with a full glass of water daily.  If you experience loose stools or diarrhea, hold the colace until you stool forms back up. If your symptoms do not get better within 1 week or if they get worse, check with your doctor. Dulcolax (bisacodyl) - Pick up over-the-counter and take as directed by the product packaging as needed to assist with the movement of your bowels.  Take with a full glass of water.  Use this product as needed if not relieved by Colace only.  MiraLax (polyethylene glycol) - Pick up over-the-counter to have on hand. MiraLax is a solution that will increase the amount of water in your bowels to assist with bowel movements.  Take as directed and can mix with a glass of water, juice, soda, coffee, or tea. Take if you go more than two days without a movement. Do not use MiraLax more than once per day. Call your doctor if you are still constipated or irregular after using this medication for 7 days in a row.  If you continue  to have problems with postoperative constipation, please contact the office for further assistance and recommendations.  If you experience "the worst abdominal pain ever" or develop nausea or vomiting, please contact the office immediatly for further recommendations for treatment.  ITCHING If you experience itching with your medications, try taking only a single pain pill, or even half a pain pill at a time.  You can also use Benadryl over the counter for itching or also to help with sleep.   MEDICATIONS See your medication summary on the "After Visit Summary" that the nursing staff will review with you prior to discharge.  You may have some home medications which will be placed on hold until you complete the course of blood thinner medication.  It is important for you to complete the blood thinner medication as prescribed by your surgeon.  Continue your approved medications as instructed at time of discharge.  PRECAUTIONS If you experience chest pain or shortness of breath - call 911 immediately for transfer to the hospital emergency department.  If you develop a fever greater that 101 F, purulent  drainage from wound, increased redness or drainage from wound, foul odor from the wound/dressing, or calf pain - CONTACT YOUR SURGEON.                                                   FOLLOW-UP APPOINTMENTS Make sure you keep all of your appointments after your operation with your surgeon and caregivers. You should call the office at the above phone number and make an appointment for approximately two weeks after the date of your surgery or on the date instructed by your surgeon outlined in the "After Visit Summary".  RANGE OF MOTION AND STRENGTHENING EXERCISES  Rehabilitation of the knee is important following a knee injury or an operation. After just a few days of immobilization, the muscles of the thigh which control the knee become weakened and shrink (atrophy). Knee exercises are designed to build up  the tone and strength of the thigh muscles and to improve knee motion. Often times heat used for twenty to thirty minutes before working out will loosen up your tissues and help with improving the range of motion but do not use heat for the first two weeks following surgery. These exercises can be done on a training (exercise) mat, on the floor, on a table or on a bed. Use what ever works the best and is most comfortable for you Knee exercises include:  Leg Lifts - While your knee is still immobilized in a splint or cast, you can do straight leg raises. Lift the leg to 60 degrees, hold for 3 sec, and slowly lower the leg. Repeat 10-20 times 2-3 times daily. Perform this exercise against resistance later as your knee gets better.  Quad and Hamstring Sets - Tighten up the muscle on the front of the thigh (Quad) and hold for 5-10 sec. Repeat this 10-20 times hourly. Hamstring sets are done by pushing the foot backward against an object and holding for 5-10 sec. Repeat as with quad sets.  Leg Slides: Lying on your back, slowly slide your foot toward your buttocks, bending your knee up off the floor (only go as far as is comfortable). Then slowly slide your foot back down until your leg is flat on the floor again. Angel Wings: Lying on your back spread your legs to the side as far apart as you can without causing discomfort.  A rehabilitation program following serious knee injuries can speed recovery and prevent re-injury in the future due to weakened muscles. Contact your doctor or a physical therapist for more information on knee rehabilitation.   POST-OPERATIVE OPIOID TAPER INSTRUCTIONS: It is important to wean off of your opioid medication as soon as possible. If you do not need pain medication after your surgery it is ok to stop day one. Opioids include: Codeine, Hydrocodone(Norco, Vicodin), Oxycodone(Percocet, oxycontin) and hydromorphone amongst others.  Long term and even short term use of opiods can  cause: Increased pain response Dependence Constipation Depression Respiratory depression And more.  Withdrawal symptoms can include Flu like symptoms Nausea, vomiting And more Techniques to manage these symptoms Hydrate well Eat regular healthy meals Stay active Use relaxation techniques(deep breathing, meditating, yoga) Do Not substitute Alcohol to help with tapering If you have been on opioids for less than two weeks and do not have pain than it is ok to stop all together.  Plan to   wean off of opioids This plan should start within one week post op of your joint replacement. Maintain the same interval or time between taking each dose and first decrease the dose.  Cut the total daily intake of opioids by one tablet each day Next start to increase the time between doses. The last dose that should be eliminated is the evening dose.   IF YOU ARE TRANSFERRED TO A SKILLED REHAB FACILITY If the patient is transferred to a skilled rehab facility following release from the hospital, a list of the current medications will be sent to the facility for the patient to continue.  When discharged from the skilled rehab facility, please have the facility set up the patient's [place] prior to being released. Also, the skilled facility will be responsible for providing the patient with their medications at time of release from the facility to include their pain medication, the muscle relaxants, and their blood thinner medication. If the patient is still at the rehab facility at time of the two week follow up appointment, the skilled rehab facility will also need to assist the patient in arranging follow up appointment in our office and any transportation needs.  MAKE SURE YOU:  Understand these instructions.  Get help right away if you are not doing well or get worse.   DENTAL ANTIBIOTICS:  In most cases prophylactic antibiotics for Dental procdeures after total joint surgery are  not necessary.  Exceptions are as follows:  1. History of prior total joint infection  2. Severely immunocompromised (Organ Transplant, cancer chemotherapy, Rheumatoid biologic meds such as Watha)  3. Poorly controlled diabetes (A1C &gt; 8.0, blood glucose over 200)  If you have one of these conditions, contact your surgeon for an antibiotic prescription, prior to your dental procedure.    Pick up stool softner and laxative for home use following surgery while on pain medications. Do not submerge incision under water. Please use good hand washing techniques while changing dressing each day. May shower starting three days after surgery. Please use a clean towel to pat the incision dry following showers. Continue to use ice for pain and swelling after surgery. Do not use any lotions or creams on the incision until instructed by your surgeon.  Information on my medicine - XARELTO (Rivaroxaban)  This medication education was reviewed with me or my healthcare representative as part of my discharge preparation. The pharmacist that spoke with me during my hospital stay was: Kristopher Oppenheim, Student-PharmD   Why was Xarelto prescribed for you? Xarelto was prescribed for you to reduce the risk of blood clots forming after orthopedic surgery. The medical term for these abnormal blood clots is venous thromboembolism (VTE).  What do you need to know about xarelto ? Take your Xarelto ONCE DAILY at the same time every day. You may take it either with or without food.  If you have difficulty swallowing the tablet whole, you may crush it and mix in applesauce just prior to taking your dose.  Take Xarelto exactly as prescribed by your doctor and DO NOT stop taking Xarelto without talking to the doctor who prescribed the medication.  Stopping without other VTE prevention medication to take the place of Xarelto may increase your risk of developing a clot.  After discharge, you should have  regular check-up appointments with your healthcare provider that is prescribing your Xarelto.    What do you do if you miss a dose? If you miss a dose, take it  as soon as you remember on the same day then continue your regularly scheduled once daily regimen the next day. Do not take two doses of Xarelto on the same day.   Important Safety Information A possible side effect of Xarelto is bleeding. You should call your healthcare provider right away if you experience any of the following: Bleeding from an injury or your nose that does not stop. Unusual colored urine (red or dark brown) or unusual colored stools (red or black). Unusual bruising for unknown reasons. A serious fall or if you hit your head (even if there is no bleeding).  Some medicines may interact with Xarelto and might increase your risk of bleeding while on Xarelto. To help avoid this, consult your healthcare provider or pharmacist prior to using any new prescription or non-prescription medications, including herbals, vitamins, non-steroidal anti-inflammatory drugs (NSAIDs) and supplements.  This website has more information on Xarelto: [email or web address]

## 2021-03-31 NOTE — Transfer of Care (Signed)
Immediate Anesthesia Transfer of Care Note  Patient: Christina [name]  Procedure(s) Performed: Procedure(s): TOTAL KNEE ARTHROPLASTY (Left)  Patient Location: PACU  Anesthesia Type:Spinal  Level of Consciousness:  sedated, patient cooperative and responds to stimulation  Airway & Oxygen Therapy:Patient Spontanous Breathing and Patient connected to face mask oxgen  Post-op Assessment:  Report given to PACU RN and Post -op Vital signs reviewed and stable  Post vital signs:  Reviewed and stable  Last Vitals:  Vitals:   03/31/21 0916 03/31/21 0921  BP: (!) 147/75 (!) 161/65  Pulse: 60 (!) 56  Resp: 19 16  Temp:    SpO2: 897% 915%    Complications: No apparent anesthesia complications

## 2021-03-31 NOTE — Care Plan (Signed)
Ortho Bundle Case Management Note  Patient Details  Name: [name] MRN: [id number] Date of Birth: Jul 14, 1950  L TKA on 03-31-21 DCP:  Home with husband and dtr.  DME:  No needs.  Has a RW. PTRosanne Gutting on 04-03-21                   DME Arranged:  N/A DME Agency:  NA  HH Arranged:  NA HH Agency:  NA  Additional Comments: Please contact me with any questions of if this plan should need to change.  Marianne Sofia, RN,CCM [facility]  [phone] 03/31/2021, 3:12 PM

## 2021-03-31 NOTE — Anesthesia Procedure Notes (Addendum)
Anesthesia Regional Block: Adductor canal block   Pre-Anesthetic Checklist: , timeout performed,  Correct Patient, Correct Site, Correct Laterality,  Correct Procedure, Correct Position, site marked,  Risks and benefits discussed,  Surgical consent,  Pre-op evaluation,  At surgeon's request and post-op pain management  Laterality: Lower and Left  Prep: chloraprep       Needles:  Injection technique: Single-shot  Needle Type: Echogenic Needle     Needle Length: 9cm  Needle Gauge: 22     Additional Needles:   Procedures:,,,, ultrasound used (permanent image in chart),,    Narrative:  Start time: 03/31/2021 9:10 AM End time: 03/31/2021 9:16 AM Injection made incrementally with aspirations every 5 mL.  Performed by: Personally  Anesthesiologist: Barnet Glasgow, MD  Additional Notes: Block assessed prior to surgery. Pt tolerated procedure well.

## 2021-03-31 NOTE — Progress Notes (Signed)
Assisted Dr. Andres Shad with Left Knee Adductor Canal block. Side rails up, monitors on throughout procedure. See vital signs in flow sheet. Tolerated Procedure well.

## 2021-03-31 NOTE — Evaluation (Signed)
Physical Therapy Evaluation Patient Details Name: Christina [name] MRN: [id number] DOB: 07/30/1950 Today's Date: 03/31/2021  History of Present Illness  70 yo  female s/p L TKA PMH: colon CA, HTN, obesity, R TKA  Clinical Impression  Pt is s/p TKA resulting in the deficits listed below (see PT Problem List).   Pt took a few steps from bed to chair, limited by L knee buckling,dizziness, fatigue as well as incr pain.  Pt required +2 assist for bed mobility and transfers today d/t global weakness/deconditioning.   Pt will benefit from skilled PT to increase their independence and safety with mobility to allow discharge to the venue listed below.         Recommendations for follow up therapy are one component of a multi-disciplinary discharge planning process, led by the attending physician.  Recommendations may be updated based on patient status, additional functional criteria and insurance authorization.  Follow Up Recommendations Follow physician's recommendations for discharge plan and follow up therapies    Assistance Recommended at Discharge Frequent or constant Supervision/Assistance  Functional Status Assessment Patient has had a recent decline in their functional status and demonstrates the ability to make significant improvements in function in a reasonable and predictable amount of time.  Equipment Recommendations       Recommendations for Other Services       Precautions / Restrictions Precautions Precautions: Fall;Knee Required Braces or Orthoses: Knee Immobilizer - Left Knee Immobilizer - Left: Discontinue once straight leg raise with < 10 degree lag Restrictions Weight Bearing Restrictions: No Other Position/Activity Restrictions: WBAT      Mobility  Bed Mobility Overal bed mobility: Needs Assistance Bed Mobility: Supine to Sit     Supine to sit: +2 for physical assistance;+2 for safety/equipment;Mod assist     General bed mobility comments: assist with LLE  and trunk, incr time needed    Transfers Overall transfer level: Needs assistance Equipment used: Rolling walker (2 wheels) Transfers: Sit to/from Stand Sit to Stand: Min assist;Mod assist;+2 safety/equipment;+2 physical assistance           General transfer comment: cues for hand placement and LLE position    Ambulation/Gait Ambulation/Gait assistance: Min assist Gait Distance (Feet): 5 Feet Assistive device: Rolling walker (2 wheels) Gait Pattern/deviations: Step-to pattern;Decreased stance time - left;Antalgic       General Gait Details: cues for sequence and use of UEs to prevent L knee buckling. limited by pain and dizziness  Stairs            Wheelchair Mobility    Modified Rankin (Stroke Patients Only)       Balance                                             Pertinent Vitals/Pain Pain Assessment: 0-10 Pain Score: 5  Pain Location: L knee Pain Descriptors / Indicators: Aching;Sore Pain Intervention(s): Premedicated before session;Monitored during session;Limited activity within patient's tolerance;Repositioned    Home Living Family/patient expects to be discharged to:: Private residence Living Arrangements: Spouse/significant other Available Help at Discharge: Family Type of Home: House Home Access: Level entry     Alternate Level Stairs-Number of Steps: 14 with R  rail Home Layout: Two level Home Equipment: Conservation officer, nature (2 wheels);Cane - single point      Prior Function Prior Level of Function : Independent/Modified Independent  Hand Dominance        Extremity/Trunk Assessment   Upper Extremity Assessment Upper Extremity Assessment: Overall WFL for tasks assessed    Lower Extremity Assessment Lower Extremity Assessment: LLE deficits/detail LLE Deficits / Details: ankle WFL, knee extension and hip flexion 2/5 LLE: Unable to fully assess due to pain       Communication       Cognition Arousal/Alertness: Awake/alert Behavior During Therapy: WFL for tasks assessed/performed Overall Cognitive Status: Within Functional Limits for tasks assessed                                          General Comments      Exercises Total Joint Exercises Ankle Circles/Pumps: AROM;Both;10 reps Quad Sets: 5 reps;Left;AROM   Assessment/Plan    PT Assessment Patient needs continued PT services  PT Problem List Decreased strength;Decreased mobility;Decreased range of motion;Decreased activity tolerance;Decreased balance;Decreased knowledge of use of DME;Pain;Obesity       PT Treatment Interventions DME instruction;Therapeutic activities;Gait training;Functional mobility training;Therapeutic exercise;Patient/family education;Stair training    PT Goals (Current goals can be found in the Care Plan section)  Acute Rehab PT Goals Patient Stated Goal: no knee pain PT Goal Formulation: With patient Time For Goal Achievement: 04/14/21 Potential to Achieve Goals: Good    Frequency 7X/week   Barriers to discharge        Co-evaluation               AM-PAC PT "6 Clicks" Mobility  Outcome Measure Help needed turning from your back to your side while in a flat bed without using bedrails?: Total Help needed moving from lying on your back to sitting on the side of a flat bed without using bedrails?: Total Help needed moving to and from a bed to a chair (including a wheelchair)?: Total Help needed standing up from a chair using your arms (e.g., wheelchair or bedside chair)?: Total Help needed to walk in hospital room?: Total Help needed climbing 3-5 steps with a railing? : Total 6 Click Score: 6    End of Session Equipment Utilized During Treatment: Gait belt;Left knee immobilizer Activity Tolerance: Patient tolerated treatment well Patient left: with call bell/phone within reach;in chair;with chair alarm set Nurse Communication: Mobility status PT  Visit Diagnosis: Other abnormalities of gait and mobility (R26.89);Difficulty in walking, not elsewhere classified (R26.2)    Time: 0211-1735 PT Time Calculation (min) (ACUTE ONLY): 26 min   Charges:   PT Evaluation $PT Eval Low Complexity: 1 Low PT Treatments $Therapeutic Activity: 8-22 mins        Baxter Flattery, PT  Acute Rehab Dept ([place]) [phone] Pager [phone]  03/31/2021   [facility] 03/31/2021, 5:54 PM

## 2021-03-31 NOTE — Anesthesia Postprocedure Evaluation (Signed)
Anesthesia Post Note  Patient: Christina [name]  Procedure(s) Performed: TOTAL KNEE ARTHROPLASTY (Left: Knee)     Patient location during evaluation: Nursing Unit Anesthesia Type: Regional Level of consciousness: oriented and awake and alert Pain management: pain level controlled Vital Signs Assessment: post-procedure vital signs reviewed and stable Respiratory status: spontaneous breathing and respiratory function stable Cardiovascular status: blood pressure returned to baseline and stable Postop Assessment: no headache, no backache, no apparent nausea or vomiting and patient able to bend at knees Anesthetic complications: no   No notable events documented.  Last Vitals:  Vitals:   03/31/21 1240 03/31/21 1454  BP: (!) 148/84 137/63  Pulse: 62 (!) 58  Resp: 15 17  Temp:  36.6 C  SpO2: 98% 100%    Last Pain:  Vitals:   03/31/21 1738  TempSrc:   PainSc: 4                  Barnet Glasgow

## 2021-03-31 NOTE — Op Note (Signed)
OPERATIVE REPORT-TOTAL KNEE ARTHROPLASTY   Pre-operative diagnosis- Osteoarthritis  Left knee(s)  Post-operative diagnosis- Osteoarthritis Left knee(s)  Procedure-  Left  Total Knee Arthroplasty  Surgeon- Dione Plover. Shawneequa Baldridge, MD  Assistant- Theresa Duty, PA-C   Anesthesia-   Adductor canal block and spinal  EBL- 25 ml   Drains None  Tourniquet time-  Total Tourniquet Time Documented: Thigh (Left) - 34 minutes Total: Thigh (Left) - 34 minutes     Complications- None  Condition-PACU - hemodynamically stable.   Brief Clinical Note  Christina [name] is a 70 y.o. year old female with end stage OA of her left knee with progressively worsening pain and dysfunction. She has constant pain, with activity and at rest and significant functional deficits with difficulties even with ADLs. She has had extensive non-op management including analgesics, injections of cortisone and viscosupplements, and home exercise program, but remains in significant pain with significant dysfunction. Radiographs show bone on bone arthritis medial and patellofemoral. She presents now for left Total Knee Arthroplasty.     Procedure in detail---   The patient is brought into the operating room and positioned supine on the operating table. After successful administration of  Adductor canal block and spinal,   a tourniquet is placed high on the  Left thigh(s) and the lower extremity is prepped and draped in the usual sterile fashion. Time out is performed by the operating team and then the  Left lower extremity is wrapped in Esmarch, knee flexed and the tourniquet inflated to 300 mmHg.       A midline incision is made with a ten blade through the subcutaneous tissue to the level of the extensor mechanism. A fresh blade is used to make a medial parapatellar arthrotomy. Soft tissue over the proximal medial tibia is subperiosteally elevated to the joint line with a knife and into the semimembranosus bursa with a Cobb  elevator. Soft tissue over the proximal lateral tibia is elevated with attention being paid to avoiding the patellar tendon on the tibial tubercle. The patella is everted, knee flexed 90 degrees and the ACL and PCL are removed. Findings are bone on bone medial and patellofemoral with large global osteophytes        The drill is used to create a starting hole in the distal femur and the canal is thoroughly irrigated with sterile saline to remove the fatty contents. The 5 degree Left  valgus alignment guide is placed into the femoral canal and the distal femoral cutting block is pinned to remove 9 mm off the distal femur. Resection is made with an oscillating saw.      The tibia is subluxed forward and the menisci are removed. The extramedullary alignment guide is placed referencing proximally at the medial aspect of the tibial tubercle and distally along the second metatarsal axis and tibial crest. The block is pinned to remove 38mm off the more deficient medial  side. Resection is made with an oscillating saw. Size 4is the most appropriate size for the tibia and the proximal tibia is prepared with the modular drill and keel punch for that size.      The femoral sizing guide is placed and size 4 is most appropriate. Rotation is marked off the epicondylar axis and confirmed by creating a rectangular flexion gap at 90 degrees. The size 4 cutting block is pinned in this rotation and the anterior, posterior and chamfer cuts are made with the oscillating saw. The intercondylar block is then placed and that cut is  made.      Trial size 4 tibial component, trial size 4 posterior stabilized femur and a 12  mm posterior stabilized rotating platform insert trial is placed. Full extension is achieved with excellent varus/valgus and anterior/posterior balance throughout full range of motion. The patella is everted and thickness measured to be 22  mm. Free hand resection is taken to 12 mm, a 35 template is placed, lug holes  are drilled, trial patella is placed, and it tracks normally. Osteophytes are removed off the posterior femur with the trial in place. All trials are removed and the cut bone surfaces prepared with pulsatile lavage. Cement is mixed and once ready for implantation, the size 4 tibial implant, size  4 posterior stabilized femoral component, and the size 35 patella are cemented in place and the patella is held with the clamp. The trial insert is placed and the knee held in full extension. The Exparel (20 ml mixed with 60 ml saline) is injected into the extensor mechanism, posterior capsule, medial and lateral gutters and subcutaneous tissues.  All extruded cement is removed and once the cement is hard the permanent 12 mm posterior stabilized rotating platform insert is placed into the tibial tray.      The wound is copiously irrigated with saline solution and the extensor mechanism closed with # 0 Stratofix suture. The tourniquet is released for a total tourniquet time of 34  minutes. Flexion against gravity is 140 degrees and the patella tracks normally. Subcutaneous tissue is closed with 2.0 vicryl and subcuticular with running 4.0 Monocryl. The incision is cleaned and dried and steri-strips and a bulky sterile dressing are applied. The limb is placed into a knee immobilizer and the patient is awakened and transported to recovery in stable condition.      Please note that a surgical assistant was a medical necessity for this procedure in order to perform it in a safe and expeditious manner. Surgical assistant was necessary to retract the ligaments and vital neurovascular structures to prevent injury to them and also necessary for proper positioning of the limb to allow for anatomic placement of the prosthesis.   Dione Plover Christina Dacey, MD    03/31/2021, 10:59 AM

## 2021-04-01 ENCOUNTER — Encounter (HOSPITAL_COMMUNITY): Payer: Self-pay | Admitting: Orthopedic Surgery

## 2021-04-01 LAB — CBC
HCT: 36.3 % (ref 36.0–46.0)
Hemoglobin: 11.9 g/dL — ABNORMAL LOW (ref 12.0–15.0)
MCH: 32.2 pg (ref 26.0–34.0)
MCHC: 32.8 g/dL (ref 30.0–36.0)
MCV: 98.1 fL (ref 80.0–100.0)
Platelets: 220 10*3/uL (ref 150–400)
RBC: 3.7 MIL/uL — ABNORMAL LOW (ref 3.87–5.11)
RDW: 14.7 % (ref 11.5–15.5)
WBC: 7.6 10*3/uL (ref 4.0–10.5)
nRBC: 0 % (ref 0.0–0.2)

## 2021-04-01 LAB — BASIC METABOLIC PANEL
Anion gap: 7 (ref 5–15)
BUN: 22 mg/dL (ref 8–23)
CO2: 24 mmol/L (ref 22–32)
Calcium: 8.8 mg/dL — ABNORMAL LOW (ref 8.9–10.3)
Chloride: 105 mmol/L (ref 98–111)
Creatinine, Ser: 1.28 mg/dL — ABNORMAL HIGH (ref 0.44–1.00)
GFR, Estimated: 45 mL/min — ABNORMAL LOW (ref >60)
Glucose, Bld: 184 mg/dL — ABNORMAL HIGH (ref 70–99)
Potassium: 3.7 mmol/L (ref 3.5–5.1)
Sodium: 136 mmol/L (ref 135–145)

## 2021-04-01 LAB — GLUCOSE, CAPILLARY
Glucose-Capillary: 145 mg/dL — ABNORMAL HIGH (ref 70–99)
Glucose-Capillary: 127 mg/dL — ABNORMAL HIGH (ref 70–99)
Glucose-Capillary: 150 mg/dL — ABNORMAL HIGH (ref 70–99)
Glucose-Capillary: 176 mg/dL — ABNORMAL HIGH (ref 70–99)

## 2021-04-01 MED ORDER — TRAMADOL HCL 50 MG PO TABS: 50.0000 mg | ORAL_TABLET | Freq: Four times a day (QID) | ORAL | 0 refills | Status: DC | PRN

## 2021-04-01 MED ORDER — RIVAROXABAN 10 MG PO TABS: 10.0000 mg | ORAL_TABLET | Freq: Every day | ORAL | 0 refills | Status: AC

## 2021-04-01 MED ORDER — OXYCODONE HCL 5 MG PO TABS: 5.0000 mg | ORAL_TABLET | Freq: Four times a day (QID) | ORAL | 0 refills | Status: DC | PRN

## 2021-04-01 MED ORDER — GABAPENTIN 300 MG PO CAPS: ORAL_CAPSULE | ORAL | 0 refills | Status: DC

## 2021-04-01 MED ORDER — METHOCARBAMOL 500 MG PO TABS: 500.0000 mg | ORAL_TABLET | Freq: Four times a day (QID) | ORAL | 0 refills | Status: DC | PRN

## 2021-04-01 NOTE — Progress Notes (Signed)
   Subjective: 1 Day Post-Op Procedure(s) (LRB): TOTAL KNEE ARTHROPLASTY (Left) Patient reports pain as mild.   Patient seen in rounds by Dr. Wynelle Link. Patient is well, and has had no acute complaints or problems other than pain in the left knee. Denies chest pain or SOB. No issues overnight, voiding without difficulty.  We will continue therapy today.   Objective: Vital signs in last 24 hours: Temp:  [97.4 F (36.3 C)-98 F (36.7 C)] 97.5 F (36.4 C) (11/29 0508) Pulse Rate:  [56-77] 60 (11/29 0508) Resp:  [15-20] 16 (11/29 0508) BP: (126-161)/(59-84) 126/69 (11/29 0508) SpO2:  [95 %-100 %] 95 % (11/29 0508)  Intake/Output from previous day:  Intake/Output Summary (Last 24 hours) at 04/01/2021 0737 Last data filed at 04/01/2021 0600 Gross per 24 hour  Intake 2891.39 ml  Output 2450 ml  Net 441.39 ml     Intake/Output this shift: No intake/output data recorded.  Labs: Recent Labs    04/01/21 0318  HGB 11.9*   Recent Labs    04/01/21 0318  WBC 7.6  RBC 3.70*  HCT 36.3  PLT 220   Recent Labs    04/01/21 0318  NA 136  K 3.7  CL 105  CO2 24  BUN 22  CREATININE 1.28*  GLUCOSE 184*  CALCIUM 8.8*   No results for input(s): LABPT, INR in the last 72 hours.  Exam: General - Patient is Alert and Oriented Extremity - Neurologically intact Neurovascular intact Sensation intact distally Dorsiflexion/Plantar flexion intact Dressing - dressing C/D/I Motor Function - intact, moving foot and toes well on exam.   Past Medical History:  Diagnosis Date   Arthritis    OA RIGHT KNEE AND PAIN   Cancer ([place]) 05/04/2009   HX OF COLON CANCER; S/P SURGERY AND DID NOT HAVE TO HAVE CHEMO OR RADIATION   Diabetes mellitus without complication (HCC)    Hypertension     Assessment/Plan: 1 Day Post-Op Procedure(s) (LRB): TOTAL KNEE ARTHROPLASTY (Left) Principal Problem:   OA (osteoarthritis) of knee Active Problems:   Primary osteoarthritis of left knee  Estimated  body mass index is 37.13 kg/m as calculated from the following:   Height as of this encounter: 5\' 2"  (1.575 m).   Weight as of this encounter: 92.1 kg. Advance diet Up with therapy D/C IV fluids   Patient's anticipated LOS is less than 2 midnights, meeting these requirements: - Lives within 1 hour of care - Has a competent adult at home to recover with post-op recover - NO history of  - Chronic pain requiring opioids  - Coronary Artery Disease  - Heart failure  - Heart attack  - Stroke  - DVT/VTE  - Cardiac arrhythmia  - Respiratory Failure/COPD  - Renal failure  - Anemia  - Advanced Liver disease  DVT Prophylaxis - Xarelto Weight bearing as tolerated. Continue therapy.  Plan is to go Home after hospital stay. Possible discharge this afternoon if progresses with therapy and meeting goals. Scheduled for OPPT at [facility] Follow-up in the office in 2 weeks  The PDMP database was reviewed today prior to any opioid medications being prescribed to this patient.  Theresa Duty, PA-C Orthopedic Surgery [phone] 04/01/2021, 7:37 AM

## 2021-04-01 NOTE — Progress Notes (Signed)
Physical Therapy Treatment Patient Details Name: Christina [name] MRN: [id number] DOB: [birth year] Today's Date: 04/01/2021   History of Present Illness 70 yo  female s/p L TKA PMH: colon CA, HTN, obesity, R TKA    PT Comments    Progressing slowly.  Mobility limited d/t pain and fatigue. Able to amb a few steps forward and back with +2 assist. Not ready to d/c from PT standpoint today. Continue PT in acute setting.  Recommendations for follow up therapy are one component of a multi-disciplinary discharge planning process, led by the attending physician.  Recommendations may be updated based on patient status, additional functional criteria and insurance authorization.  Follow Up Recommendations  Follow physician's recommendations for discharge plan and follow up therapies     Assistance Recommended at Discharge Frequent or constant Supervision/Assistance  Equipment Recommendations  None recommended by PT    Recommendations for Other Services       Precautions / Restrictions Precautions Precautions: Fall;Knee Required Braces or Orthoses: Knee Immobilizer - Left Knee Immobilizer - Left: Discontinue once straight leg raise with < 10 degree lag     Mobility  Bed Mobility Overal bed mobility: Needs Assistance Bed Mobility: Sit to Supine       Sit to supine: Min assist;+2 for physical assistance;+2 for safety/equipment   General bed mobility comments: assist with LLE and trunk, incr time needed. cus for tehcnique and positioning    Transfers Overall transfer level: Needs assistance Equipment used: Rolling walker (2 wheels) Transfers: Sit to/from Stand Sit to Stand: Min assist;Mod assist;+2 physical assistance;+2 safety/equipment           General transfer comment: cues for hand placement and LLE position. assist with anterior-superior wt shift. initial posterior LOB needing assist to recover    Ambulation/Gait Ambulation/Gait assistance: +2 safety/equipment;Mod  assist;+2 physical assistance;Min assist Gait Distance (Feet): 5 Feet (forward and back) Assistive device: Rolling walker (2 wheels) Gait Pattern/deviations: Step-to pattern;Decreased stance time - left;Antalgic Gait velocity: decr     General Gait Details: cues for sequence and use of UEs to assist with pain control. incr difficulty advancing RLE d/t pain with WBing LLE. fatigues quickly   Stairs             Wheelchair Mobility    Modified Rankin (Stroke Patients Only)       Balance                                            Cognition Arousal/Alertness: Awake/alert Behavior During Therapy: WFL for tasks assessed/performed Overall Cognitive Status: Within Functional Limits for tasks assessed                                          Exercises Total Joint Exercises Ankle Circles/Pumps: AROM;Both;5 reps Quad Sets: 5 reps;Left;AROM;Limitations Quad Sets Limitations: pain Heel Slides: AAROM;Left;10 reps Straight Leg Raises: AAROM;Left;10 reps    General Comments        Pertinent Vitals/Pain Pain Assessment: 0-10 Pain Score: 8  Pain Location: L knee Pain Descriptors / Indicators: Aching;Sore Pain Intervention(s): Limited activity within patient's tolerance;Monitored during session;Premedicated before session;Repositioned    Home Living  Prior Function            PT Goals (current goals can now be found in the care plan section) Acute Rehab PT Goals Patient Stated Goal: no knee pain PT Goal Formulation: With patient Time For Goal Achievement: 04/14/21 Potential to Achieve Goals: Good Progress towards PT goals: Progressing toward goals    Frequency    7X/week      PT Plan Current plan remains appropriate    Co-evaluation              AM-PAC PT "6 Clicks" Mobility   Outcome Measure  Help needed turning from your back to your side while in a flat bed without using  bedrails?: Total Help needed moving from lying on your back to sitting on the side of a flat bed without using bedrails?: A Lot Help needed moving to and from a bed to a chair (including a wheelchair)?: A Lot Help needed standing up from a chair using your arms (e.g., wheelchair or bedside chair)?: A Lot Help needed to walk in hospital room?: A Lot Help needed climbing 3-5 steps with a railing? : Total 6 Click Score: 10    End of Session Equipment Utilized During Treatment: Gait belt;Left knee immobilizer Activity Tolerance: Patient limited by pain;Patient limited by fatigue (dizzy) Patient left: with call bell/phone within reach;in bed;with bed alarm set Nurse Communication: Mobility status PT Visit Diagnosis: Other abnormalities of gait and mobility (R26.89);Difficulty in walking, not elsewhere classified (R26.2)     Time: 1657-9038 PT Time Calculation (min) (ACUTE ONLY): 23 min  Charges:  $Gait Training: 8-22 mins $Therapeutic Exercise: 8-22 mins                     Baxter Flattery, PT  Acute Rehab Dept ([place]) [phone] Pager [phone]  04/01/2021    [facility] 04/01/2021, 5:03 PM

## 2021-04-01 NOTE — Plan of Care (Signed)
Plan of care reviewed and discussed with the patient. 

## 2021-04-01 NOTE — Progress Notes (Signed)
Physical Therapy Treatment Patient Details Name: Christina [name] MRN: [id number] DOB: 02/20/1951 Today's Date: 04/01/2021   History of Present Illness 70 yo  female s/p L TKA PMH: colon CA, HTN, obesity, R TKA    PT Comments    Pt is progressing slowly, requires excessive time to complete tasks. Amb ~ 20' with RW, min assist and chair follow d/t pt c/o "lightheadedness", fatigue and pain. Seated rest needed after 20'. Pt may need another day of PT to incr independence and safety. She will be home alone much of the day bc her husband works during the day.    Recommendations for follow up therapy are one component of a multi-disciplinary discharge planning process, led by the attending physician.  Recommendations may be updated based on patient status, additional functional criteria and insurance authorization.  Follow Up Recommendations  Follow physician's recommendations for discharge plan and follow up therapies     Assistance Recommended at Discharge Frequent or constant Supervision/Assistance  Equipment Recommendations  None recommended by PT    Recommendations for Other Services       Precautions / Restrictions Precautions Precautions: Fall;Knee Required Braces or Orthoses: Knee Immobilizer - Left Knee Immobilizer - Left: Discontinue once straight leg raise with < 10 degree lag Restrictions Weight Bearing Restrictions: No     Mobility  Bed Mobility Overal bed mobility: Needs Assistance Bed Mobility: Supine to Sit     Supine to sit: Min assist;+2 for safety/equipment;Mod assist;HOB elevated     General bed mobility comments: assist with LLE and trunk, incr time needed. HOB elevated ~ 70 degrees    Transfers Overall transfer level: Needs assistance Equipment used: Rolling walker (2 wheels) Transfers: Sit to/from Stand Sit to Stand: Min assist;Mod assist;+2 physical assistance;+2 safety/equipment           General transfer comment: cues for hand placement and  LLE position. assist with anterior-superior wt shift    Ambulation/Gait Ambulation/Gait assistance: +2 safety/equipment;Min assist Gait Distance (Feet): 20 Feet Assistive device: Rolling walker (2 wheels) Gait Pattern/deviations: Step-to pattern;Decreased stance time - left;Antalgic       General Gait Details: cues for sequence and use of UEs to assist with pain control with WBing LLE  limited by pain and dizziness   Stairs             Wheelchair Mobility    Modified Rankin (Stroke Patients Only)       Balance                                            Cognition Arousal/Alertness: Awake/alert Behavior During Therapy: WFL for tasks assessed/performed Overall Cognitive Status: Within Functional Limits for tasks assessed                                          Exercises Total Joint Exercises Ankle Circles/Pumps: AROM;Both;5 reps    General Comments        Pertinent Vitals/Pain Pain Assessment: 0-10 Pain Score: 6  Pain Location: L knee Pain Descriptors / Indicators: Aching;Sore Pain Intervention(s): Limited activity within patient's tolerance;Monitored during session;Premedicated before session;Repositioned;Ice applied    Home Living  Prior Function            PT Goals (current goals can now be found in the care plan section) Acute Rehab PT Goals Patient Stated Goal: no knee pain PT Goal Formulation: With patient Time For Goal Achievement: 04/14/21 Potential to Achieve Goals: Good Progress towards PT goals: Progressing toward goals    Frequency    7X/week      PT Plan Current plan remains appropriate    Co-evaluation              AM-PAC PT "6 Clicks" Mobility   Outcome Measure  Help needed turning from your back to your side while in a flat bed without using bedrails?: Total Help needed moving from lying on your back to sitting on the side of a flat bed without  using bedrails?: A Lot Help needed moving to and from a bed to a chair (including a wheelchair)?: A Lot Help needed standing up from a chair using your arms (e.g., wheelchair or bedside chair)?: A Lot Help needed to walk in hospital room?: A Lot Help needed climbing 3-5 steps with a railing? : Total 6 Click Score: 10    End of Session Equipment Utilized During Treatment: Gait belt;Left knee immobilizer Activity Tolerance: Patient limited by pain;Patient limited by fatigue (dizzy) Patient left: with call bell/phone within reach;in chair;with chair alarm set Nurse Communication: Mobility status PT Visit Diagnosis: Other abnormalities of gait and mobility (R26.89);Difficulty in walking, not elsewhere classified (R26.2)     Time: 3094-0768 PT Time Calculation (min) (ACUTE ONLY): 24 min  Charges:  $Gait Training: 23-37 mins                     Baxter Flattery, PT  Acute Rehab Dept ([place]) [phone] Pager [phone]  04/01/2021    [facility] 04/01/2021, 10:27 AM

## 2021-04-01 NOTE — TOC Transition Note (Signed)
Transition of Care [facility]) - CM/SW Discharge Note   Patient Details  Name: Christina [name] MRN: [id number] Date of Birth: 17-Sep-1950  [facility] CM/SW Contact:  Lennart Pall, LCSW Phone Number: 04/01/2021, 10:45 AM   Clinical Narrative:     Met briefly with pt and confirming she has all needed DME.  Plan for OPPT at [facility].  No TOC needs.  Final next level of care: OP Rehab Barriers to Discharge: No Barriers Identified   Patient Goals and CMS Choice Patient states their goals for this hospitalization and ongoing recovery are:: return home      Discharge Placement                       Discharge Plan and Services                DME Arranged: N/A DME Agency: NA       HH Arranged: NA HH Agency: NA        Social Determinants of Health (SDOH) Interventions     Readmission Risk Interventions No flowsheet data found.

## 2021-04-02 DIAGNOSIS — I10 Essential (primary) hypertension: Secondary | ICD-10-CM | POA: Diagnosis not present

## 2021-04-02 LAB — CBC
HCT: 32.2 % — ABNORMAL LOW (ref 36.0–46.0)
Hemoglobin: 10.7 g/dL — ABNORMAL LOW (ref 12.0–15.0)
MCH: 32.2 pg (ref 26.0–34.0)
MCHC: 33.2 g/dL (ref 30.0–36.0)
MCV: 97 fL (ref 80.0–100.0)
Platelets: 209 10*3/uL (ref 150–400)
RBC: 3.32 MIL/uL — ABNORMAL LOW (ref 3.87–5.11)
RDW: 14.7 % (ref 11.5–15.5)
WBC: 6.8 10*3/uL (ref 4.0–10.5)
nRBC: 0 % (ref 0.0–0.2)

## 2021-04-02 LAB — BASIC METABOLIC PANEL
Anion gap: 5 (ref 5–15)
BUN: 24 mg/dL — ABNORMAL HIGH (ref 8–23)
CO2: 25 mmol/L (ref 22–32)
Calcium: 8.5 mg/dL — ABNORMAL LOW (ref 8.9–10.3)
Chloride: 107 mmol/L (ref 98–111)
Creatinine, Ser: 1.26 mg/dL — ABNORMAL HIGH (ref 0.44–1.00)
GFR, Estimated: 46 mL/min — ABNORMAL LOW (ref >60)
Glucose, Bld: 166 mg/dL — ABNORMAL HIGH (ref 70–99)
Potassium: 3.5 mmol/L (ref 3.5–5.1)
Sodium: 137 mmol/L (ref 135–145)

## 2021-04-02 LAB — GLUCOSE, CAPILLARY
Glucose-Capillary: 148 mg/dL — ABNORMAL HIGH (ref 70–99)
Glucose-Capillary: 184 mg/dL — ABNORMAL HIGH (ref 70–99)
Glucose-Capillary: 150 mg/dL — ABNORMAL HIGH (ref 70–99)
Glucose-Capillary: 155 mg/dL — ABNORMAL HIGH (ref 70–99)

## 2021-04-02 NOTE — Progress Notes (Signed)
   Subjective: 2 Days Post-Op Procedure(s) (LRB): TOTAL KNEE ARTHROPLASTY (Left) Patient reports pain as mild.   Patient seen in rounds by Dr. Wynelle Link. Patient is well, and has had no acute complaints or problems. Denies SOB, chest pain, or calf pain. No acute overnight events. Ambulated 5 feet with therapy yesterday. Will continue therapy today.   Plan is to go Home after hospital stay.  Objective: Vital signs in last 24 hours: Temp:  [97.6 F (36.4 C)-99.6 F (37.6 C)] 99.5 F (37.5 C) (11/30 0510) Pulse Rate:  [56-92] 92 (11/30 0510) Resp:  [16-18] 16 (11/30 0510) BP: (137-171)/(54-77) 155/68 (11/30 0510) SpO2:  [94 %-99 %] 95 % (11/30 0510)  Intake/Output from previous day:  Intake/Output Summary (Last 24 hours) at 04/02/2021 0734 Last data filed at 04/02/2021 0600 Gross per 24 hour  Intake 725.75 ml  Output 550 ml  Net 175.75 ml    Intake/Output this shift: No intake/output data recorded.  Labs: Recent Labs    04/01/21 0318 04/02/21 0328  HGB 11.9* 10.7*   Recent Labs    04/01/21 0318 04/02/21 0328  WBC 7.6 6.8  RBC 3.70* 3.32*  HCT 36.3 32.2*  PLT 220 209   Recent Labs    04/01/21 0318 04/02/21 0328  NA 136 137  K 3.7 3.5  CL 105 107  CO2 24 25  BUN 22 24*  CREATININE 1.28* 1.26*  GLUCOSE 184* 166*  CALCIUM 8.8* 8.5*   No results for input(s): LABPT, INR in the last 72 hours.  Exam: General - Patient is Alert and Oriented Extremity - Neurologically intact Neurovascular intact Intact pulses distally Dorsiflexion/Plantar flexion intact Dressing/Incision - clean, dry, no drainage Motor Function - intact, moving foot and toes well on exam.   Past Medical History:  Diagnosis Date   Arthritis    OA RIGHT KNEE AND PAIN   Cancer ([place]) 05/04/2009   HX OF COLON CANCER; S/P SURGERY AND DID NOT HAVE TO HAVE CHEMO OR RADIATION   Diabetes mellitus without complication (HCC)    Hypertension     Assessment/Plan: 2 Days Post-Op Procedure(s)  (LRB): TOTAL KNEE ARTHROPLASTY (Left) Principal Problem:   OA (osteoarthritis) of knee Active Problems:   Primary osteoarthritis of left knee  Estimated body mass index is 37.13 kg/m as calculated from the following:   Height as of this encounter: 5\' 2"  (1.575 m).   Weight as of this encounter: 92.1 kg. Up with therapy  DVT Prophylaxis - Xarelto and TED hose Weight-bearing as tolerated  Plan for two sessions with PT today, and if meeting goals, will plan for discharge this afternoon.   Patient to follow up in two weeks with Dr. Wynelle Link in clinic.   The PDMP database was reviewed today prior to any opioid medications being prescribed to this patient.Fenton Foy, [place], PA-C Orthopedic Surgery [phone] 04/02/2021, 7:34 AM

## 2021-04-02 NOTE — Progress Notes (Signed)
Physical Therapy Treatment Patient Details Name: Christina [name] MRN: [id number] DOB: 03-17-1951 Today's Date: 04/02/2021   History of Present Illness 70 yo  female s/p L TKA PMH: colon CA, HTN, obesity, R TKA    PT Comments    Pt assisted with ambulating and tolerated short distance.  Pt requiring mod +2 assist for mobility at this time due to weakness, pain, fatigue, and poor balance.  Pt assisted to [facility] prior to returning to bed end of session.  Pt very slowly progressing with mobility and not yet ready for d/c home.    Recommendations for follow up therapy are one component of a multi-disciplinary discharge planning process, led by the attending physician.  Recommendations may be updated based on patient status, additional functional criteria and insurance authorization.  Follow Up Recommendations  Follow physician's recommendations for discharge plan and follow up therapies     Assistance Recommended at Discharge Frequent or constant Supervision/Assistance  Equipment Recommendations  None recommended by PT    Recommendations for Other Services       Precautions / Restrictions Precautions Precautions: Fall;Knee Required Braces or Orthoses: Knee Immobilizer - Left Knee Immobilizer - Left: Discontinue once straight leg raise with < 10 degree lag Restrictions Other Position/Activity Restrictions: WBAT     Mobility  Bed Mobility Overal bed mobility: Needs Assistance Bed Mobility: Sit to Supine       Sit to supine: Min assist   General bed mobility comments: assist for Lt LE onto bed    Transfers Overall transfer level: Needs assistance Equipment used: Rolling walker (2 wheels) Transfers: Sit to/from Stand;Bed to chair/wheelchair/BSC Sit to Stand: Mod assist;+2 physical assistance;+2 safety/equipment;Min assist     Step pivot transfers: Mod assist;+2 safety/equipment;+2 physical assistance;From elevated surface     General transfer comment: cues for hand  placement and LLE position. assist with anterior-superior wt shift. initial posterior LOB needing assist to recover, assisted from [facility] back to bed end of session    Ambulation/Gait Ambulation/Gait assistance: +2 safety/equipment;Min assist Gait Distance (Feet): 16 Feet Assistive device: Rolling walker (2 wheels) Gait Pattern/deviations: Step-to pattern;Decreased stance time - left;Antalgic;Trunk flexed Gait velocity: decr     General Gait Details: cues for sequence, use of UEs through RW, balance, posture; pt with frequent bouts of unsteadiness requiring at least min assist to correct; requires increased time; recliner following for safety however pt did not need   Stairs             Wheelchair Mobility    Modified Rankin (Stroke Patients Only)       Balance Overall balance assessment: Needs assistance         Standing balance support: Bilateral upper extremity supported;Reliant on assistive device for balance Standing balance-Leahy Scale: Poor                              Cognition Arousal/Alertness: Awake/alert Behavior During Therapy: WFL for tasks assessed/performed Overall Cognitive Status: Within Functional Limits for tasks assessed                                          Exercises    General Comments        Pertinent Vitals/Pain Pain Assessment: 0-10 Pain Score: 7  Pain Location: L knee Pain Descriptors / Indicators: Aching;Sore Pain Intervention(s): Repositioned;Monitored during session;Premedicated before session;Ice applied  Home Living                          Prior Function            PT Goals (current goals can now be found in the care plan section) Progress towards PT goals: Progressing toward goals    Frequency    7X/week      PT Plan Current plan remains appropriate    Co-evaluation              AM-PAC PT "6 Clicks" Mobility   Outcome Measure  Help needed turning from  your back to your side while in a flat bed without using bedrails?: A Lot Help needed moving from lying on your back to sitting on the side of a flat bed without using bedrails?: A Lot Help needed moving to and from a bed to a chair (including a wheelchair)?: A Lot Help needed standing up from a chair using your arms (e.g., wheelchair or bedside chair)?: A Lot Help needed to walk in hospital room?: A Lot Help needed climbing 3-5 steps with a railing? : Total 6 Click Score: 11    End of Session Equipment Utilized During Treatment: Gait belt;Left knee immobilizer Activity Tolerance: Patient limited by fatigue Patient left: with call bell/phone within reach;in bed;with bed alarm set Nurse Communication: Mobility status PT Visit Diagnosis: Other abnormalities of gait and mobility (R26.89);Difficulty in walking, not elsewhere classified (R26.2)     Time: 7672-0947 PT Time Calculation (min) (ACUTE ONLY): 38 min  Charges:  $Gait Training: 23-37 mins                    Jannette Spanner PT, DPT Acute Rehabilitation Services Pager: [phone] Office: [place] 04/02/2021, 3:45 PM

## 2021-04-02 NOTE — Plan of Care (Signed)
  Problem: Coping: Goal: Level of anxiety will decrease Outcome: Progressing   Problem: Pain Managment: Goal: General experience of comfort will improve Outcome: Progressing   Problem: Safety: Goal: Ability to remain free from injury will improve Outcome: Progressing   

## 2021-04-02 NOTE — Progress Notes (Signed)
Physical Therapy Treatment Patient Details Name: Christina [name] MRN: [id number] DOB: Sep 19, 1950 Today's Date: 04/02/2021   History of Present Illness 70 yo  female s/p L TKA PMH: colon CA, HTN, obesity, R TKA    PT Comments    Pt assisted with taking a few steps in room however continues to require increased time and assist.  Pt also performed LE exercises in recliner.    Recommendations for follow up therapy are one component of a multi-disciplinary discharge planning process, led by the attending physician.  Recommendations may be updated based on patient status, additional functional criteria and insurance authorization.  Follow Up Recommendations  Follow physician's recommendations for discharge plan and follow up therapies     Assistance Recommended at Discharge Frequent or constant Supervision/Assistance  Equipment Recommendations  None recommended by PT    Recommendations for Other Services       Precautions / Restrictions Precautions Precautions: Fall;Knee Required Braces or Orthoses: Knee Immobilizer - Left Knee Immobilizer - Left: Discontinue once straight leg raise with < 10 degree lag Restrictions Other Position/Activity Restrictions: WBAT     Mobility  Bed Mobility               General bed mobility comments: pt in recliner on arrival    Transfers Overall transfer level: Needs assistance Equipment used: Rolling walker (2 wheels) Transfers: Sit to/from Stand Sit to Stand: Mod assist;+2 physical assistance;+2 safety/equipment           General transfer comment: cues for hand placement and LLE position. assist with anterior-superior wt shift. initial posterior LOB needing assist to recover    Ambulation/Gait Ambulation/Gait assistance: +2 safety/equipment;Mod assist;+2 physical assistance;Min assist Gait Distance (Feet): 8 Feet Assistive device: Rolling walker (2 wheels) Gait Pattern/deviations: Step-to pattern;Decreased stance time -  left;Antalgic;Trunk flexed Gait velocity: decr     General Gait Details: cues for sequence, use of UEs through RW, balance, posture; requires increased time and fatigues quickly   Stairs             Wheelchair Mobility    Modified Rankin (Stroke Patients Only)       Balance                                            Cognition Arousal/Alertness: Awake/alert Behavior During Therapy: WFL for tasks assessed/performed Overall Cognitive Status: Within Functional Limits for tasks assessed                                          Exercises Total Joint Exercises Ankle Circles/Pumps: AROM;Both;10 reps Quad Sets: AROM;Both;10 reps Heel Slides: AAROM;Left;10 reps Hip ABduction/ADduction: AAROM;Left;10 reps Straight Leg Raises: AAROM;Left;10 reps    General Comments        Pertinent Vitals/Pain Pain Assessment: 0-10 Pain Score: 7  Pain Location: L knee Pain Descriptors / Indicators: Aching;Sore Pain Intervention(s): Repositioned;Monitored during session;Premedicated before session;Ice applied    Home Living                          Prior Function            PT Goals (current goals can now be found in the care plan section) Progress towards PT goals: Progressing toward goals    Frequency  7X/week      PT Plan Current plan remains appropriate    Co-evaluation              AM-PAC PT "6 Clicks" Mobility   Outcome Measure  Help needed turning from your back to your side while in a flat bed without using bedrails?: A Lot Help needed moving from lying on your back to sitting on the side of a flat bed without using bedrails?: A Lot Help needed moving to and from a bed to a chair (including a wheelchair)?: A Lot Help needed standing up from a chair using your arms (e.g., wheelchair or bedside chair)?: A Lot Help needed to walk in hospital room?: A Lot Help needed climbing 3-5 steps with a railing? :  Total 6 Click Score: 11    End of Session Equipment Utilized During Treatment: Gait belt;Left knee immobilizer Activity Tolerance: Patient limited by fatigue;Patient limited by pain Patient left: in chair;with call bell/phone within reach;with chair alarm set Nurse Communication: Mobility status PT Visit Diagnosis: Other abnormalities of gait and mobility (R26.89);Difficulty in walking, not elsewhere classified (R26.2)     Time: 9604-5409 PT Time Calculation (min) (ACUTE ONLY): 24 min  Charges:  $Gait Training: 8-22 mins $Therapeutic Exercise: 8-22 mins                     Jannette Spanner PT, DPT Acute Rehabilitation Services Pager: [phone] Office: [place] 04/02/2021, 3:38 PM

## 2021-04-03 DIAGNOSIS — G720 Drug-induced myopathy: Secondary | ICD-10-CM | POA: Diagnosis present

## 2021-04-03 DIAGNOSIS — M1712 Unilateral primary osteoarthritis, left knee: Secondary | ICD-10-CM | POA: Diagnosis present

## 2021-04-03 DIAGNOSIS — Z85038 Personal history of other malignant neoplasm of large intestine: Secondary | ICD-10-CM | POA: Diagnosis not present

## 2021-04-03 DIAGNOSIS — I10 Essential (primary) hypertension: Secondary | ICD-10-CM | POA: Diagnosis present

## 2021-04-03 DIAGNOSIS — M8580 Other specified disorders of bone density and structure, unspecified site: Secondary | ICD-10-CM | POA: Diagnosis present

## 2021-04-03 DIAGNOSIS — E119 Type 2 diabetes mellitus without complications: Secondary | ICD-10-CM | POA: Diagnosis present

## 2021-04-03 DIAGNOSIS — Z8249 Family history of ischemic heart disease and other diseases of the circulatory system: Secondary | ICD-10-CM | POA: Diagnosis not present

## 2021-04-03 DIAGNOSIS — E669 Obesity, unspecified: Secondary | ICD-10-CM | POA: Diagnosis present

## 2021-04-03 DIAGNOSIS — Z882 Allergy status to sulfonamides status: Secondary | ICD-10-CM | POA: Diagnosis not present

## 2021-04-03 DIAGNOSIS — Z7984 Long term (current) use of oral hypoglycemic drugs: Secondary | ICD-10-CM | POA: Diagnosis not present

## 2021-04-03 DIAGNOSIS — Z79899 Other long term (current) drug therapy: Secondary | ICD-10-CM | POA: Diagnosis not present

## 2021-04-03 DIAGNOSIS — G8929 Other chronic pain: Secondary | ICD-10-CM | POA: Diagnosis present

## 2021-04-03 DIAGNOSIS — Z96651 Presence of right artificial knee joint: Secondary | ICD-10-CM | POA: Diagnosis present

## 2021-04-03 DIAGNOSIS — Z20822 Contact with and (suspected) exposure to covid-19: Secondary | ICD-10-CM | POA: Diagnosis present

## 2021-04-03 DIAGNOSIS — Z880 Allergy status to penicillin: Secondary | ICD-10-CM | POA: Diagnosis not present

## 2021-04-03 DIAGNOSIS — M791 Myalgia, unspecified site: Secondary | ICD-10-CM | POA: Diagnosis present

## 2021-04-03 DIAGNOSIS — Z888 Allergy status to other drugs, medicaments and biological substances status: Secondary | ICD-10-CM | POA: Diagnosis not present

## 2021-04-03 DIAGNOSIS — Z6837 Body mass index (BMI) 37.0-37.9, adult: Secondary | ICD-10-CM | POA: Diagnosis not present

## 2021-04-03 LAB — CBC
HCT: 31.7 % — ABNORMAL LOW (ref 36.0–46.0)
Hemoglobin: 10.6 g/dL — ABNORMAL LOW (ref 12.0–15.0)
MCH: 32.7 pg (ref 26.0–34.0)
MCHC: 33.4 g/dL (ref 30.0–36.0)
MCV: 97.8 fL (ref 80.0–100.0)
Platelets: 205 10*3/uL (ref 150–400)
RBC: 3.24 MIL/uL — ABNORMAL LOW (ref 3.87–5.11)
RDW: 15.1 % (ref 11.5–15.5)
WBC: 6.8 10*3/uL (ref 4.0–10.5)
nRBC: 0 % (ref 0.0–0.2)

## 2021-04-03 LAB — GLUCOSE, CAPILLARY
Glucose-Capillary: 105 mg/dL — ABNORMAL HIGH (ref 70–99)
Glucose-Capillary: 129 mg/dL — ABNORMAL HIGH (ref 70–99)
Glucose-Capillary: 125 mg/dL — ABNORMAL HIGH (ref 70–99)
Glucose-Capillary: 137 mg/dL — ABNORMAL HIGH (ref 70–99)

## 2021-04-03 NOTE — Progress Notes (Signed)
Physical Therapy Treatment Patient Details Name: Christina [name] MRN: [id number] DOB: [birth year] Today's Date: 04/03/2021   History of Present Illness 70 yo  female s/p L TKA PMH: colon CA, HTN, obesity, R TKA    PT Comments    Pt assisted with ambulating in hallway and improved to min/guard today however still only tolerating limited distance.  Pt reports her husband works during the day however he will be available on Saturday.  Pt with improved progress today and believe she will benefit from further PT session tomorrow in anticipation of d/c home tomorrow (which she is in agreement).  RN notified.    Recommendations for follow up therapy are one component of a multi-disciplinary discharge planning process, led by the attending physician.  Recommendations may be updated based on patient status, additional functional criteria and insurance authorization.  Follow Up Recommendations  Home health PT     Assistance Recommended at Discharge Frequent or constant Supervision/Assistance  Equipment Recommendations  None recommended by PT    Recommendations for Other Services       Precautions / Restrictions Precautions Precautions: Fall;Knee Required Braces or Orthoses: Knee Immobilizer - Left Knee Immobilizer - Left: Discontinue once straight leg raise with < 10 degree lag Restrictions Other Position/Activity Restrictions: WBAT     Mobility  Bed Mobility Overal bed mobility: Needs Assistance Bed Mobility: Sit to Supine       Sit to supine: Min assist   General bed mobility comments: pt attempted to self assist however required min assist for Lt LE support    Transfers Overall transfer level: Needs assistance Equipment used: Rolling walker (2 wheels) Transfers: Sit to/from Stand Sit to Stand: Min guard           General transfer comment: cues for hand placement and LLE position. increased time to perform anterior-superior wt shift however no LOB with rise or physical  assist required this afternoon    Ambulation/Gait Ambulation/Gait assistance: Min guard Gait Distance (Feet): 25 Feet Assistive device: Rolling walker (2 wheels) Gait Pattern/deviations: Step-to pattern;Decreased stance time - left;Antalgic;Trunk flexed Gait velocity: decr     General Gait Details: cues for sequence, use of UEs through RW, balance, posture; requires increased time and provided cues to improve pace as tolerated; pt did have a couple episodes of instability however able to self correct this afternoon; recliner following which pt required (unable to ambulate back to room which was pt's goal)   Stairs             Wheelchair Mobility    Modified Rankin (Stroke Patients Only)       Balance                                            Cognition Arousal/Alertness: Awake/alert Behavior During Therapy: WFL for tasks assessed/performed Overall Cognitive Status: Within Functional Limits for tasks assessed                                          Exercises     General Comments        Pertinent Vitals/Pain Pain Assessment: 0-10 Pain Score: 5  Pain Location: L knee Pain Descriptors / Indicators: Aching;Sore Pain Intervention(s): Repositioned;Monitored during session;Premedicated before session    Home Living  Prior Function            PT Goals (current goals can now be found in the care plan section) Progress towards PT goals: Progressing toward goals    Frequency    7X/week      PT Plan Current plan remains appropriate    Co-evaluation              AM-PAC PT "6 Clicks" Mobility   Outcome Measure  Help needed turning from your back to your side while in a flat bed without using bedrails?: A Little Help needed moving from lying on your back to sitting on the side of a flat bed without using bedrails?: A Little Help needed moving to and from a bed to a chair  (including a wheelchair)?: A Little Help needed standing up from a chair using your arms (e.g., wheelchair or bedside chair)?: A Little Help needed to walk in hospital room?: A Lot Help needed climbing 3-5 steps with a railing? : Total 6 Click Score: 15    End of Session Equipment Utilized During Treatment: Gait belt;Left knee immobilizer Activity Tolerance: Patient tolerated treatment well Patient left: in bed;with call bell/phone within reach;with bed alarm set Nurse Communication: Mobility status PT Visit Diagnosis: Other abnormalities of gait and mobility (R26.89);Difficulty in walking, not elsewhere classified (R26.2)     Time: 6160-7371 PT Time Calculation (min) (ACUTE ONLY): 29 min  Charges:  $Gait Training: 23-37 mins            Jannette Spanner PT, DPT Acute Rehabilitation Services Pager: [phone] Office: [place] 04/03/2021, 3:49 PM

## 2021-04-03 NOTE — Progress Notes (Signed)
   Subjective: 3 Days Post-Op Procedure(s) (LRB): TOTAL KNEE ARTHROPLASTY (Left) Patient reports pain as mild.   Patient seen in rounds by Dr. Wynelle Link. Patient is well, and has had no acute complaints or problems. Denies SOB, chest pain, or calf pain. No acute overnight events. Ambulated 16 feet with therapy yesterday. Will continue therapy today.   Plan is to go Home after hospital stay.  Objective: Vital signs in last 24 hours: Temp:  [97.9 F (36.6 C)-99 F (37.2 C)] 99 F (37.2 C) (12/01 0524) Pulse Rate:  [78-86] 78 (12/01 0524) Resp:  [16] 16 (12/01 0524) BP: (142-161)/(63-66) 153/66 (12/01 0524) SpO2:  [97 %-99 %] 97 % (12/01 0524)  Intake/Output from previous day:  Intake/Output Summary (Last 24 hours) at 04/03/2021 0801 Last data filed at 04/03/2021 0200 Gross per 24 hour  Intake 360 ml  Output 550 ml  Net -190 ml    Intake/Output this shift: No intake/output data recorded.  Labs: Recent Labs    04/01/21 0318 04/02/21 0328 04/03/21 0257  HGB 11.9* 10.7* 10.6*   Recent Labs    04/02/21 0328 04/03/21 0257  WBC 6.8 6.8  RBC 3.32* 3.24*  HCT 32.2* 31.7*  PLT 209 205   Recent Labs    04/01/21 0318 04/02/21 0328  NA 136 137  K 3.7 3.5  CL 105 107  CO2 24 25  BUN 22 24*  CREATININE 1.28* 1.26*  GLUCOSE 184* 166*  CALCIUM 8.8* 8.5*   No results for input(s): LABPT, INR in the last 72 hours.  Exam: General - Patient is Alert and Oriented Extremity - Neurologically intact Neurovascular intact Intact pulses distally Dorsiflexion/Plantar flexion intact Dressing/Incision - clean, dry, no drainage Motor Function - intact, moving foot and toes well on exam.   Past Medical History:  Diagnosis Date   Arthritis    OA RIGHT KNEE AND PAIN   Cancer ([place]) 05/04/2009   HX OF COLON CANCER; S/P SURGERY AND DID NOT HAVE TO HAVE CHEMO OR RADIATION   Diabetes mellitus without complication (HCC)    Hypertension     Assessment/Plan: 3 Days Post-Op  Procedure(s) (LRB): TOTAL KNEE ARTHROPLASTY (Left) Principal Problem:   OA (osteoarthritis) of knee Active Problems:   Primary osteoarthritis of left knee  Estimated body mass index is 37.13 kg/m as calculated from the following:   Height as of this encounter: 5\' 2"  (1.575 m).   Weight as of this encounter: 92.1 kg. Up with therapy  DVT Prophylaxis - Xarelto and TED hose Weight-bearing as tolerated  Plan for two sessions with PT this morning, and if meeting goals, will plan for discharge this afternoon.   Patient to follow up in two weeks with Dr. Wynelle Link in clinic.   The PDMP database was reviewed today prior to any opioid medications being prescribed to this patient.Fenton Foy, [place], PA-C Orthopedic Surgery [phone] 04/03/2021, 8:01 AM

## 2021-04-03 NOTE — Progress Notes (Signed)
Physical Therapy Treatment Patient Details Name: Christina [name] MRN: [id number] DOB: Sep 14, 1950 Today's Date: 04/03/2021   History of Present Illness 70 yo  female s/p L TKA PMH: colon CA, HTN, obesity, R TKA    PT Comments    Pt more talkative and reports feeling better today (mentally and pain improved).  Pt assisted with ambulating in hallway and performing exercises.  Pt still requiring some assist for mobility, so will return for afternoon session.    Recommendations for follow up therapy are one component of a multi-disciplinary discharge planning process, led by the attending physician.  Recommendations may be updated based on patient status, additional functional criteria and insurance authorization.  Follow Up Recommendations  Home health PT (recommend HHPT)     Assistance Recommended at Discharge Frequent or constant Supervision/Assistance  Equipment Recommendations  None recommended by PT    Recommendations for Other Services       Precautions / Restrictions Precautions Precautions: Fall;Knee Required Braces or Orthoses: Knee Immobilizer - Left Knee Immobilizer - Left: Discontinue once straight leg raise with < 10 degree lag Restrictions Other Position/Activity Restrictions: WBAT     Mobility  Bed Mobility               General bed mobility comments: pt in recliner    Transfers Overall transfer level: Needs assistance Equipment used: Rolling walker (2 wheels) Transfers: Sit to/from Stand Sit to Stand: Min assist;+2 physical assistance           General transfer comment: cues for hand placement and LLE position. assist with anterior-superior wt shift. no LOB with rise today    Ambulation/Gait Ambulation/Gait assistance: Min assist;Min guard;+2 safety/equipment Gait Distance (Feet): 25 Feet Assistive device: Rolling walker (2 wheels) Gait Pattern/deviations: Step-to pattern;Decreased stance time - left;Antalgic;Trunk flexed Gait velocity: decr      General Gait Details: cues for sequence, use of UEs through RW, balance, posture; requires increased time and provided cues to improve pace as tolerated; recliner following for safety   Stairs             Wheelchair Mobility    Modified Rankin (Stroke Patients Only)       Balance                                            Cognition Arousal/Alertness: Awake/alert Behavior During Therapy: WFL for tasks assessed/performed Overall Cognitive Status: Within Functional Limits for tasks assessed                                          Exercises Total Joint Exercises Ankle Circles/Pumps: AROM;Both;10 reps Quad Sets: AROM;Both;10 reps Heel Slides: AAROM;Left;10 reps Hip ABduction/ADduction: AAROM;Left;10 reps Straight Leg Raises: AAROM;Left;10 reps Goniometric ROM: approx 25* AAROM left knee flexion, limited by pain    General Comments        Pertinent Vitals/Pain Pain Assessment: 0-10 Pain Score: 5  Pain Location: L knee Pain Descriptors / Indicators: Aching;Sore Pain Intervention(s): Repositioned;Monitored during session;Premedicated before session;Ice applied    Home Living                          Prior Function            PT Goals (current goals can  now be found in the care plan section) Progress towards PT goals: Progressing toward goals    Frequency    7X/week      PT Plan Current plan remains appropriate    Co-evaluation              AM-PAC PT "6 Clicks" Mobility   Outcome Measure  Help needed turning from your back to your side while in a flat bed without using bedrails?: A Lot Help needed moving from lying on your back to sitting on the side of a flat bed without using bedrails?: A Lot Help needed moving to and from a bed to a chair (including a wheelchair)?: A Lot Help needed standing up from a chair using your arms (e.g., wheelchair or bedside chair)?: A Lot Help needed to walk in  hospital room?: A Lot Help needed climbing 3-5 steps with a railing? : Total 6 Click Score: 11    End of Session Equipment Utilized During Treatment: Gait belt;Left knee immobilizer Activity Tolerance: Patient tolerated treatment well Patient left: in chair;with call bell/phone within reach;with chair alarm set Nurse Communication: Mobility status PT Visit Diagnosis: Other abnormalities of gait and mobility (R26.89);Difficulty in walking, not elsewhere classified (R26.2)     Time: 2536-6440 PT Time Calculation (min) (ACUTE ONLY): 27 min  Charges:  $Gait Training: 8-22 mins $Therapeutic Exercise: 8-22 mins                     Jannette Spanner PT, DPT Acute Rehabilitation Services Pager: [phone] Office: [place] 04/03/2021, 3:43 PM

## 2021-04-04 LAB — GLUCOSE, CAPILLARY
Glucose-Capillary: 106 mg/dL — ABNORMAL HIGH (ref 70–99)
Glucose-Capillary: 132 mg/dL — ABNORMAL HIGH (ref 70–99)

## 2021-04-04 NOTE — Plan of Care (Signed)
  Problem: Activity: Goal: Risk for activity intolerance will decrease Outcome: Progressing   Problem: Pain Managment: Goal: General experience of comfort will improve Outcome: Progressing   Problem: Safety: Goal: Ability to remain free from injury will improve Outcome: Progressing   

## 2021-04-04 NOTE — Progress Notes (Signed)
PT Cancellation Note  Patient Details Name: Christina [name] MRN: [id number] DOB: [birth year]   Cancelled Treatment:    Reason Eval/Treat Not Completed: Other (comment) Attempted to see this morning.  Pt had just finished bathing and using BSC and requested a break.  Pt then eating lunch upon return.  Will continue efforts.   [name] 04/04/2021, 12:03 PM Arlyce Dice, DPT Acute Rehabilitation Services Pager: [phone] Office: [phone]

## 2021-04-04 NOTE — Plan of Care (Signed)
  Problem: Health Behavior/Discharge Planning: Goal: Ability to manage health-related needs will improve Outcome: Adequate for Discharge   Problem: Clinical Measurements: Goal: Ability to maintain clinical measurements within normal limits will improve Outcome: Adequate for Discharge Goal: Will remain free from infection Outcome: Adequate for Discharge Goal: Diagnostic test results will improve Outcome: Adequate for Discharge Goal: Respiratory complications will improve Outcome: Adequate for Discharge Goal: Cardiovascular complication will be avoided Outcome: Adequate for Discharge   Problem: Activity: Goal: Risk for activity intolerance will decrease Outcome: Adequate for Discharge   Problem: Coping: Goal: Level of anxiety will decrease Outcome: Adequate for Discharge   Problem: Elimination: Goal: Will not experience complications related to bowel motility Outcome: Adequate for Discharge Goal: Will not experience complications related to urinary retention Outcome: Adequate for Discharge   Problem: Pain Managment: Goal: General experience of comfort will improve Outcome: Adequate for Discharge   Problem: Safety: Goal: Ability to remain free from injury will improve Outcome: Adequate for Discharge   Problem: Skin Integrity: Goal: Risk for impaired skin integrity will decrease Outcome: Adequate for Discharge   Problem: Education: Goal: Knowledge of the prescribed therapeutic regimen will improve Outcome: Adequate for Discharge   Problem: Activity: Goal: Ability to avoid complications of mobility impairment will improve Outcome: Adequate for Discharge Goal: Range of joint motion will improve Outcome: Adequate for Discharge   Problem: Clinical Measurements: Goal: Postoperative complications will be avoided or minimized Outcome: Adequate for Discharge   Problem: Pain Management: Goal: Pain level will decrease with appropriate interventions Outcome: Adequate for  Discharge   Problem: Skin Integrity: Goal: Will show signs of wound healing Outcome: Adequate for Discharge   Problem: Acute Rehab PT Goals(only PT should resolve) Goal: Pt Will Go Supine/Side To Sit Outcome: Adequate for Discharge Goal: Patient Will Transfer Sit To/From Stand Outcome: Adequate for Discharge Goal: Pt Will Ambulate Outcome: Adequate for Discharge  Pt discharged home with husband.  Discharge teaching done and written information given.

## 2021-04-04 NOTE — Progress Notes (Signed)
Physical Therapy Treatment Patient Details Name: Christina [name] MRN: [id number] DOB: Aug 09, 1950 Today's Date: 04/04/2021   History of Present Illness 70 yo  female s/p L TKA PMH: colon CA, HTN, obesity, R TKA    PT Comments    Pt requiring increased time and effort to perform all mobility however able to ambulate 25 feet into hallway again today.  Pt educated on use and application of KI (spouse will likely have to assist pt with donning).  Pt reports her husband will come pick her up today around 6pm.  HEP handout provided as well as gait belt for pt to take home.  Pt does not feel she will physically be able to get to OPPT.  Continue to recommend HHPT.    Recommendations for follow up therapy are one component of a multi-disciplinary discharge planning process, led by the attending physician.  Recommendations may be updated based on patient status, additional functional criteria and insurance authorization.  Follow Up Recommendations  Home health PT     Assistance Recommended at Discharge Frequent or constant Supervision/Assistance  Equipment Recommendations  None recommended by PT    Recommendations for Other Services       Precautions / Restrictions Precautions Precautions: Fall;Knee Precaution Comments: pt educated on KI use and application Required Braces or Orthoses: Knee Immobilizer - Left Knee Immobilizer - Left: Discontinue once straight leg raise with < 10 degree lag Restrictions Other Position/Activity Restrictions: WBAT     Mobility  Bed Mobility Overal bed mobility: Needs Assistance Bed Mobility: Supine to Sit     Supine to sit: Min guard;HOB elevated     General bed mobility comments: increased time and effort for pt to self assist Lt LE using gait belt    Transfers Overall transfer level: Needs assistance Equipment used: Rolling walker (2 wheels) Transfers: Sit to/from Stand Sit to Stand: Min guard;Min assist           General transfer comment:  cues for hand placement and LLE position. increased time to perform anterior-superior wt shift and light assist to rise    Ambulation/Gait Ambulation/Gait assistance: Min guard Gait Distance (Feet): 25 Feet Assistive device: Rolling walker (2 wheels) Gait Pattern/deviations: Step-to pattern;Decreased stance time - left;Antalgic;Trunk flexed Gait velocity: decr     General Gait Details: cues for sequence, use of UEs through RW, balance, posture; requires increased time; pt fatigued quickly, distance to tolerance   Stairs             Wheelchair Mobility    Modified Rankin (Stroke Patients Only)       Balance                                            Cognition Arousal/Alertness: Awake/alert Behavior During Therapy: WFL for tasks assessed/performed Overall Cognitive Status: Within Functional Limits for tasks assessed                                          Exercises      General Comments        Pertinent Vitals/Pain Pain Assessment: 0-10 Pain Score: 6  Pain Location: L knee Pain Descriptors / Indicators: Aching;Sore Pain Intervention(s): Repositioned;Monitored during session;Ice applied    Home Living  Prior Function            PT Goals (current goals can now be found in the care plan section) Progress towards PT goals: Progressing toward goals    Frequency    7X/week      PT Plan Current plan remains appropriate    Co-evaluation              AM-PAC PT "6 Clicks" Mobility   Outcome Measure  Help needed turning from your back to your side while in a flat bed without using bedrails?: A Little Help needed moving from lying on your back to sitting on the side of a flat bed without using bedrails?: A Little Help needed moving to and from a bed to a chair (including a wheelchair)?: A Little Help needed standing up from a chair using your arms (e.g., wheelchair or  bedside chair)?: A Little Help needed to walk in hospital room?: A Lot Help needed climbing 3-5 steps with a railing? : Total 6 Click Score: 15    End of Session Equipment Utilized During Treatment: Gait belt;Left knee immobilizer Activity Tolerance: Patient tolerated treatment well Patient left: in chair;with call bell/phone within reach Nurse Communication: Mobility status PT Visit Diagnosis: Other abnormalities of gait and mobility (R26.89);Difficulty in walking, not elsewhere classified (R26.2)     Time: 7841-2820 PT Time Calculation (min) (ACUTE ONLY): 26 min  Charges:  $Gait Training: 23-37 mins                    Jannette Spanner PT, DPT Acute Rehabilitation Services Pager: [phone] Office: [place] 04/04/2021, 3:52 PM

## 2021-04-04 NOTE — Progress Notes (Signed)
   Subjective: 4 Days Post-Op Procedure(s) (LRB): TOTAL KNEE ARTHROPLASTY (Left) Patient reports pain as mild.   Patient seen in rounds by Dr. Wynelle Link. Patient is well, and has had no acute complaints or problems. Denies SOB, chest pain, or calf pain. No acute overnight events. Ambulated  25 feet with therapy yesterday. Will continue therapy today.   Plan is to go Home after hospital stay.  Objective: Vital signs in last 24 hours: Temp:  [98.4 F (36.9 C)-98.9 F (37.2 C)] 98.6 F (37 C) (12/02 0434) Pulse Rate:  [82-88] 82 (12/02 0434) Resp:  [16-18] 18 (12/02 0434) BP: (156-165)/(66-73) 165/73 (12/02 0434) SpO2:  [98 %-100 %] 98 % (12/02 0434)  Intake/Output from previous day:  Intake/Output Summary (Last 24 hours) at 04/04/2021 1015 Last data filed at 04/04/2021 0200 Gross per 24 hour  Intake 560 ml  Output --  Net 560 ml    Intake/Output this shift: No intake/output data recorded.  Labs: Recent Labs    04/02/21 0328 04/03/21 0257  HGB 10.7* 10.6*   Recent Labs    04/02/21 0328 04/03/21 0257  WBC 6.8 6.8  RBC 3.32* 3.24*  HCT 32.2* 31.7*  PLT 209 205   Recent Labs    04/02/21 0328  NA 137  K 3.5  CL 107  CO2 25  BUN 24*  CREATININE 1.26*  GLUCOSE 166*  CALCIUM 8.5*   No results for input(s): LABPT, INR in the last 72 hours.  Exam: General - Patient is Alert and Oriented Extremity - Neurologically intact Neurovascular intact Intact pulses distally Dorsiflexion/Plantar flexion intact Dressing/Incision - clean, dry, no drainage Motor Function - intact, moving foot and toes well on exam.   Past Medical History:  Diagnosis Date   Arthritis    OA RIGHT KNEE AND PAIN   Cancer ([place]) 05/04/2009   HX OF COLON CANCER; S/P SURGERY AND DID NOT HAVE TO HAVE CHEMO OR RADIATION   Diabetes mellitus without complication ([facility])    Hypertension     Assessment/Plan: 4 Days Post-Op Procedure(s) (LRB): TOTAL KNEE ARTHROPLASTY (Left) Principal Problem:    OA (osteoarthritis) of knee Active Problems:   Primary osteoarthritis of left knee  Estimated body mass index is 37.13 kg/m as calculated from the following:   Height as of this encounter: 5\' 2"  (1.575 m).   Weight as of this encounter: 92.1 kg. Advance diet  DVT Prophylaxis - Xarelto and TED hose Weight-bearing as tolerated  Patient progressed with PT yesterday.  Will continue today with hopes for discharge today, but she is concerned about availability of help at home, and may need to be discharged tomorrow morning.   Will push forward with outpatient PT - will contact office today to get rescheduled for Monday or Tuesday of next week.   Plan for two sessions with PT today, and if meeting goals, will plan for discharge this afternoon vs. tomorrow morning.   The PDMP database was reviewed today prior to any opioid medications being prescribed to this patient.Fenton Foy, [place], PA-C Orthopedic Surgery [phone] 04/04/2021, 10:15 AM

## 2021-04-07 DIAGNOSIS — M25562 Pain in left knee: Secondary | ICD-10-CM | POA: Diagnosis not present

## 2021-04-09 DIAGNOSIS — M25562 Pain in left knee: Secondary | ICD-10-CM | POA: Diagnosis not present

## 2021-04-09 NOTE — Discharge Summary (Signed)
Physician Discharge Summary   Patient ID: Christina [name] MRN: [id number] DOB/AGE: 71-Jan-1952 71 y.o.  Admit date: 03/31/2021 Discharge date: 04/04/2021  Primary Diagnosis: Osteoarthritis, left knee   Admission Diagnoses:  Past Medical History:  Diagnosis Date   Arthritis    OA RIGHT KNEE AND PAIN   Cancer ([place]) 05/04/2009   HX OF COLON CANCER; S/P SURGERY AND DID NOT HAVE TO HAVE CHEMO OR RADIATION   Diabetes mellitus without complication ([place])    Hypertension    Discharge Diagnoses:   Principal Problem:   OA (osteoarthritis) of knee Active Problems:   Primary osteoarthritis of left knee  Estimated body mass index is 37.13 kg/m as calculated from the following:   Height as of this encounter: 5' 2" (1.575 m).   Weight as of this encounter: 92.1 kg.  Procedure:  Procedure(s) (LRB): TOTAL KNEE ARTHROPLASTY (Left)   Consults: None  HPI: Christina [name] is a 70 y.o. year old female with end stage OA of her left knee with progressively worsening pain and dysfunction. She has constant pain, with activity and at rest and significant functional deficits with difficulties even with ADLs. She has had extensive non-op management including analgesics, injections of cortisone and viscosupplements, and home exercise program, but remains in significant pain with significant dysfunction. Radiographs show bone on bone arthritis medial and patellofemoral. She presents now for left Total Knee Arthroplasty.     Laboratory Data: Admission on 03/31/2021, Discharged on 04/04/2021  Component Date Value Ref Range Status   SARS Coronavirus 2 03/31/2021 NEGATIVE  NEGATIVE Final   Comment: (NOTE) SARS-CoV-2 target nucleic acids are NOT DETECTED.  The SARS-CoV-2 RNA is generally detectable in upper and lower respiratory specimens during the acute phase of infection. The lowest concentration of SARS-CoV-2 viral copies this assay can detect is 250 copies / mL. A negative result does not preclude  SARS-CoV-2 infection and should not be used as the sole basis for treatment or other patient management decisions.  A negative result may occur with improper specimen collection / handling, submission of specimen other than nasopharyngeal swab, presence of viral mutation(s) within the areas targeted by this assay, and inadequate number of viral copies (<250 copies / mL). A negative result must be combined with clinical observations, patient history, and epidemiological information.  Fact Sheet for Patients:   StrictlyIdeas.no  Fact Sheet for Healthcare Providers: BankingDealers.co.za  This test is not yet approved or                           cleared by the [place] FDA and has been authorized for detection and/or diagnosis of SARS-CoV-2 by FDA under an Emergency Use Authorization (EUA).  This EUA will remain in effect (meaning this test can be used) for the duration of the COVID-19 declaration under Section 564(b)(1) of the Act, 21 U.S.C. section 360bbb-3(b)(1), unless the authorization is terminated or revoked sooner.  Performed at [facility], [place] [address] [place], [place] [address]    Glucose-Capillary 03/31/2021 94  70 - 99 mg/dL Final   Glucose reference range applies only to samples taken after fasting for at least 8 hours.   Glucose-Capillary 03/31/2021 85  70 - 99 mg/dL Final   Glucose reference range applies only to samples taken after fasting for at least 8 hours.   WBC 04/01/2021 7.6  4.0 - 10.5 K/uL Final   RBC 04/01/2021 3.70 (L)  3.87 - 5.11 MIL/uL Final   Hemoglobin  04/01/2021 11.9 (L)  12.0 - 15.0 g/dL Final   HCT 04/01/2021 36.3  36.0 - 46.0 % Final   MCV 04/01/2021 98.1  80.0 - 100.0 fL Final   MCH 04/01/2021 32.2  26.0 - 34.0 pg Final   MCHC 04/01/2021 32.8  30.0 - 36.0 g/dL Final   RDW 04/01/2021 14.7  11.5 - 15.5 % Final   Platelets 04/01/2021 220  150 - 400 K/uL Final   nRBC  04/01/2021 0.0  0.0 - 0.2 % Final   Performed at [facility], [place] [address] [place], [place] [address]   Sodium 04/01/2021 136  135 - 145 mmol/L Final   Potassium 04/01/2021 3.7  3.5 - 5.1 mmol/L Final   Chloride 04/01/2021 105  98 - 111 mmol/L Final   CO2 04/01/2021 24  22 - 32 mmol/L Final   Glucose, Bld 04/01/2021 184 (H)  70 - 99 mg/dL Final   Glucose reference range applies only to samples taken after fasting for at least 8 hours.   BUN 04/01/2021 22  8 - 23 mg/dL Final   Creatinine, Ser 04/01/2021 1.28 (H)  0.44 - 1.00 mg/dL Final   Calcium 04/01/2021 8.8 (L)  8.9 - 10.3 mg/dL Final   GFR, Estimated 04/01/2021 45 (L)  >60 mL/min Final   Comment: (NOTE) Calculated using the CKD-EPI Creatinine Equation (2021)    Anion gap 04/01/2021 7  5 - 15 Final   Performed at [facility], [place] [address] [place], [place] [address]   Glucose-Capillary 03/31/2021 269 (H)  70 - 99 mg/dL Final   Glucose reference range applies only to samples taken after fasting for at least 8 hours.   Glucose-Capillary 03/31/2021 197 (H)  70 - 99 mg/dL Final   Glucose reference range applies only to samples taken after fasting for at least 8 hours.   Glucose-Capillary 04/01/2021 145 (H)  70 - 99 mg/dL Final   Glucose reference range applies only to samples taken after fasting for at least 8 hours.   Glucose-Capillary 04/01/2021 127 (H)  70 - 99 mg/dL Final   Glucose reference range applies only to samples taken after fasting for at least 8 hours.   Glucose-Capillary 04/01/2021 150 (H)  70 - 99 mg/dL Final   Glucose reference range applies only to samples taken after fasting for at least 8 hours.   WBC 04/02/2021 6.8  4.0 - 10.5 K/uL Final   RBC 04/02/2021 3.32 (L)  3.87 - 5.11 MIL/uL Final   Hemoglobin 04/02/2021 10.7 (L)  12.0 - 15.0 g/dL Final   HCT 04/02/2021 32.2 (L)  36.0 - 46.0 % Final   MCV 04/02/2021 97.0  80.0 - 100.0 fL Final   MCH 04/02/2021 32.2  26.0 - 34.0  pg Final   MCHC 04/02/2021 33.2  30.0 - 36.0 g/dL Final   RDW 04/02/2021 14.7  11.5 - 15.5 % Final   Platelets 04/02/2021 209  150 - 400 K/uL Final   nRBC 04/02/2021 0.0  0.0 - 0.2 % Final   Performed at [facility], [place] [address] [place], [place] [address]   Sodium 04/02/2021 137  135 - 145 mmol/L Final   Potassium 04/02/2021 3.5  3.5 - 5.1 mmol/L Final   Chloride 04/02/2021 107  98 - 111 mmol/L Final   CO2 04/02/2021 25  22 - 32 mmol/L Final   Glucose, Bld 04/02/2021 166 (H)  70 - 99 mg/dL Final   Glucose reference range applies only to  samples taken after fasting for at least 8 hours.   BUN 04/02/2021 24 (H)  8 - 23 mg/dL Final   Creatinine, Ser 04/02/2021 1.26 (H)  0.44 - 1.00 mg/dL Final   Calcium 04/02/2021 8.5 (L)  8.9 - 10.3 mg/dL Final   GFR, Estimated 04/02/2021 46 (L)  >60 mL/min Final   Comment: (NOTE) Calculated using the CKD-EPI Creatinine Equation (2021)    Anion gap 04/02/2021 5  5 - 15 Final   Performed at [facility], [place] [address] [place], [place] [address]   Glucose-Capillary 04/01/2021 176 (H)  70 - 99 mg/dL Final   Glucose reference range applies only to samples taken after fasting for at least 8 hours.   Glucose-Capillary 04/02/2021 148 (H)  70 - 99 mg/dL Final   Glucose reference range applies only to samples taken after fasting for at least 8 hours.   Glucose-Capillary 04/02/2021 184 (H)  70 - 99 mg/dL Final   Glucose reference range applies only to samples taken after fasting for at least 8 hours.   Glucose-Capillary 04/02/2021 150 (H)  70 - 99 mg/dL Final   Glucose reference range applies only to samples taken after fasting for at least 8 hours.   WBC 04/03/2021 6.8  4.0 - 10.5 K/uL Final   RBC 04/03/2021 3.24 (L)  3.87 - 5.11 MIL/uL Final   Hemoglobin 04/03/2021 10.6 (L)  12.0 - 15.0 g/dL Final   HCT 04/03/2021 31.7 (L)  36.0 - 46.0 % Final   MCV 04/03/2021 97.8  80.0 - 100.0 fL Final   MCH 04/03/2021 32.7  26.0  - 34.0 pg Final   MCHC 04/03/2021 33.4  30.0 - 36.0 g/dL Final   RDW 04/03/2021 15.1  11.5 - 15.5 % Final   Platelets 04/03/2021 205  150 - 400 K/uL Final   nRBC 04/03/2021 0.0  0.0 - 0.2 % Final   Performed at [facility], [place] [address], [place], [place] [address]   Glucose-Capillary 04/02/2021 155 (H)  70 - 99 mg/dL Final   Glucose reference range applies only to samples taken after fasting for at least 8 hours.   Glucose-Capillary 04/03/2021 105 (H)  70 - 99 mg/dL Final   Glucose reference range applies only to samples taken after fasting for at least 8 hours.   Glucose-Capillary 04/03/2021 129 (H)  70 - 99 mg/dL Final   Glucose reference range applies only to samples taken after fasting for at least 8 hours.   Glucose-Capillary 04/03/2021 125 (H)  70 - 99 mg/dL Final   Glucose reference range applies only to samples taken after fasting for at least 8 hours.   Glucose-Capillary 04/03/2021 137 (H)  70 - 99 mg/dL Final   Glucose reference range applies only to samples taken after fasting for at least 8 hours.   Glucose-Capillary 04/04/2021 106 (H)  70 - 99 mg/dL Final   Glucose reference range applies only to samples taken after fasting for at least 8 hours.   Glucose-Capillary 04/04/2021 132 (H)  70 - 99 mg/dL Final   Glucose reference range applies only to samples taken after fasting for at least 8 hours.  Hospital Outpatient Visit on 03/24/2021  Component Date Value Ref Range Status   WBC 03/24/2021 4.3  4.0 - 10.5 K/uL Final   RBC 03/24/2021 4.33  3.87 - 5.11 MIL/uL Final   Hemoglobin 03/24/2021 14.0  12.0 - 15.0 g/dL Final   HCT 03/24/2021 42.3  36.0 - 46.0 % Final  MCV 03/24/2021 97.7  80.0 - 100.0 fL Final   MCH 03/24/2021 32.3  26.0 - 34.0 pg Final   MCHC 03/24/2021 33.1  30.0 - 36.0 g/dL Final   RDW 03/24/2021 14.3  11.5 - 15.5 % Final   Platelets 03/24/2021 249  150 - 400 K/uL Final   nRBC 03/24/2021 0.0  0.0 - 0.2 % Final   Performed at [facility], [place] [address], [place], [place] [address]   Sodium 03/24/2021 140  135 - 145 mmol/L Final   Potassium 03/24/2021 4.5  3.5 - 5.1 mmol/L Final   Chloride 03/24/2021 106  98 - 111 mmol/L Final   CO2 03/24/2021 26  22 - 32 mmol/L Final   Glucose, Bld 03/24/2021 86  70 - 99 mg/dL Final   Glucose reference range applies only to samples taken after fasting for at least 8 hours.   BUN 03/24/2021 20  8 - 23 mg/dL Final   Creatinine, Ser 03/24/2021 1.56 (H)  0.44 - 1.00 mg/dL Final   Calcium 03/24/2021 9.5  8.9 - 10.3 mg/dL Final   Total Protein 03/24/2021 7.3  6.5 - 8.1 g/dL Final   Albumin 03/24/2021 4.2  3.5 - 5.0 g/dL Final   AST 03/24/2021 25  15 - 41 U/L Final   ALT 03/24/2021 22  0 - 44 U/L Final   Alkaline Phosphatase 03/24/2021 60  38 - 126 U/L Final   Total Bilirubin 03/24/2021 0.7  0.3 - 1.2 mg/dL Final   GFR, Estimated 03/24/2021 35 (L)  >60 mL/min Final   Comment: (NOTE) Calculated using the CKD-EPI Creatinine Equation (2021)    Anion gap 03/24/2021 8  5 - 15 Final   Performed at [facility], [place] [address], [place], [place] [address]   ABO/RH(D) 03/24/2021 B NEG   Final   Antibody Screen 03/24/2021 NEG   Final   Sample Expiration 03/24/2021 04/03/2021,2359   Final   Extend sample reason 03/24/2021    Final                   Value:NO TRANSFUSIONS OR PREGNANCY IN THE PAST 3 MONTHS Performed at [facility] [address], [place], [place] [address]    Prothrombin Time 03/24/2021 12.8  11.4 - 15.2 seconds Final   INR 03/24/2021 1.0  0.8 - 1.2 Final   Comment: (NOTE) INR goal varies based on device and disease states. Performed at [facility], [place] [address], [place], [place] [address]    MRSA, PCR 03/24/2021 NEGATIVE  NEGATIVE Final   Staphylococcus aureus 03/24/2021 NEGATIVE  NEGATIVE Final   Comment: (NOTE) The Xpert SA Assay (FDA approved for NASAL specimens in patients 62 years of age and  older), is one component of a comprehensive surveillance program. It is not intended to diagnose infection nor to guide or monitor treatment. Performed at [facility], [place] [address] [place], [place] [address]    Hgb A1c MFr Bld 03/24/2021 6.1 (H)  4.8 - 5.6 % Final   Comment: (NOTE) Pre diabetes:          5.7%-6.4%  Diabetes:              >6.4%  Glycemic control for   <7.0% adults with diabetes    Mean Plasma Glucose 03/24/2021 128.37  mg/dL Final   Performed at [facility], [place] [address] [place],  [address]   Glucose-Capillary 03/24/2021 79  70 - 99  mg/dL Final   Glucose reference range applies only to samples taken after fasting for at least 8 hours.     X-Rays:No results found.  EKG: Orders placed or performed during the hospital encounter of 03/24/21   EKG 12 lead per protocol   EKG 12 lead per protocol     Hospital Course: Christina [name] is a 70 y.o. who was admitted to [facility]. They were brought to the operating room on 03/31/2021 and underwent Procedure(s): TOTAL KNEE ARTHROPLASTY.  Patient tolerated the procedure well and was later transferred to the recovery room and then to the orthopaedic floor for postoperative care. They were given PO and IV analgesics for pain control following their surgery. They were given 24 hours of postoperative antibiotics of  Anti-infectives (From admission, onward)    Start     Dose/Rate Route Frequency Ordered Stop   03/31/21 1600  ceFAZolin (ANCEF) IVPB 2g/100 mL premix        2 g 200 mL/hr over 30 Minutes Intravenous Every 6 hours 03/31/21 1237 04/01/21 0752   03/31/21 0715  ceFAZolin (ANCEF) IVPB 2g/100 mL premix        2 g 200 mL/hr over 30 Minutes Intravenous On call to O.R. 03/31/21 0701 03/31/21 0944      and started on DVT prophylaxis in the form of Xarelto.   PT and OT were ordered for total joint protocol. Discharge planning consulted to help with postop disposition and  equipment needs. Patient had a decent night on the evening of surgery. They started to get up OOB with therapy on Pod #1. Hospitalization was uneventful but prolonged due to slow progression with therapy. Pt was seen during rounds on day three and was ready to go home pending progress with therapy. Aquacel was in place without drainage. Pt worked with therapy for two additional sessions and was meeting their goals. She was discharged to home later that day in stable condition.  Diet: Diabetic diet Activity: WBAT Follow-up: in 2 weeks Disposition: Home with OPPT Discharged Condition: stable   Discharge Instructions     Call MD / Call 911   Complete by: As directed    If you experience chest pain or shortness of breath, CALL 911 and be transported to the hospital emergency room.  If you develope a fever above 101 F, pus (white drainage) or increased drainage or redness at the wound, or calf pain, call your surgeon's office.   Call MD / Call 911   Complete by: As directed    If you experience chest pain or shortness of breath, CALL 911 and be transported to the hospital emergency room.  If you develope a fever above 101 F, pus (white drainage) or increased drainage or redness at the wound, or calf pain, call your surgeon's office.   Call MD / Call 911   Complete by: As directed    If you experience chest pain or shortness of breath, CALL 911 and be transported to the hospital emergency room.  If you develope a fever above 101 F, pus (white drainage) or increased drainage or redness at the wound, or calf pain, call your surgeon's office.   Call MD / Call 911   Complete by: As directed    If you experience chest pain or shortness of breath, CALL 911 and be transported to the hospital emergency room.  If you develope a fever above 101 F, pus (white drainage) or increased drainage or redness at the wound, or calf  pain, call your surgeon's office.   Change dressing   Complete by: As directed    You  may remove the bulky bandage (ACE wrap and gauze) two days after surgery. You will have an adhesive waterproof bandage underneath. Leave this in place until your first follow-up appointment.   Change dressing   Complete by: As directed    You may remove the bulky bandage (ACE wrap and gauze) two days after surgery. You will have an adhesive waterproof bandage underneath. Leave this in place until your first follow-up appointment.   Change dressing   Complete by: As directed    You may remove the bulky bandage (ACE wrap and gauze) two days after surgery. You will have an adhesive waterproof bandage underneath. Leave this in place until your first follow-up appointment.   Constipation Prevention   Complete by: As directed    Drink plenty of fluids.  Prune juice may be helpful.  You may use a stool softener, such as Colace (over the counter) 100 mg twice a day.  Use MiraLax (over the counter) for constipation as needed.   Constipation Prevention   Complete by: As directed    Drink plenty of fluids.  Prune juice may be helpful.  You may use a stool softener, such as Colace (over the counter) 100 mg twice a day.  Use MiraLax (over the counter) for constipation as needed.   Constipation Prevention   Complete by: As directed    Drink plenty of fluids.  Prune juice may be helpful.  You may use a stool softener, such as Colace (over the counter) 100 mg twice a day.  Use MiraLax (over the counter) for constipation as needed.   Constipation Prevention   Complete by: As directed    Drink plenty of fluids.  Prune juice may be helpful.  You may use a stool softener, such as Colace (over the counter) 100 mg twice a day.  Use MiraLax (over the counter) for constipation as needed.   Diet - low sodium heart healthy   Complete by: As directed    Diet - low sodium heart healthy   Complete by: As directed    Diet - low sodium heart healthy   Complete by: As directed    Diet - low sodium heart healthy   Complete  by: As directed    Do not put a pillow under the knee. Place it under the heel.   Complete by: As directed    Do not put a pillow under the knee. Place it under the heel.   Complete by: As directed    Do not put a pillow under the knee. Place it under the heel.   Complete by: As directed    Driving restrictions   Complete by: As directed    No driving for two weeks   Driving restrictions   Complete by: As directed    No driving for two weeks   Driving restrictions   Complete by: As directed    No driving for two weeks   Increase activity slowly as tolerated   Complete by: As directed    Post-operative opioid taper instructions:   Complete by: As directed    POST-OPERATIVE OPIOID TAPER INSTRUCTIONS: It is important to wean off of your opioid medication as soon as possible. If you do not need pain medication after your surgery it is ok to stop day one. Opioids include: Codeine, Hydrocodone(Norco, Vicodin), Oxycodone(Percocet, oxycontin) and hydromorphone amongst others.  Long  term and even short term use of opiods can cause: Increased pain response Dependence Constipation Depression Respiratory depression And more.  Withdrawal symptoms can include Flu like symptoms Nausea, vomiting And more Techniques to manage these symptoms Hydrate well Eat regular healthy meals Stay active Use relaxation techniques(deep breathing, meditating, yoga) Do Not substitute Alcohol to help with tapering If you have been on opioids for less than two weeks and do not have pain than it is ok to stop all together.  Plan to wean off of opioids This plan should start within one week post op of your joint replacement. Maintain the same interval or time between taking each dose and first decrease the dose.  Cut the total daily intake of opioids by one tablet each day Next start to increase the time between doses. The last dose that should be eliminated is the evening dose.      Post-operative  opioid taper instructions:   Complete by: As directed    POST-OPERATIVE OPIOID TAPER INSTRUCTIONS: It is important to wean off of your opioid medication as soon as possible. If you do not need pain medication after your surgery it is ok to stop day one. Opioids include: Codeine, Hydrocodone(Norco, Vicodin), Oxycodone(Percocet, oxycontin) and hydromorphone amongst others.  Long term and even short term use of opiods can cause: Increased pain response Dependence Constipation Depression Respiratory depression And more.  Withdrawal symptoms can include Flu like symptoms Nausea, vomiting And more Techniques to manage these symptoms Hydrate well Eat regular healthy meals Stay active Use relaxation techniques(deep breathing, meditating, yoga) Do Not substitute Alcohol to help with tapering If you have been on opioids for less than two weeks and do not have pain than it is ok to stop all together.  Plan to wean off of opioids This plan should start within one week post op of your joint replacement. Maintain the same interval or time between taking each dose and first decrease the dose.  Cut the total daily intake of opioids by one tablet each day Next start to increase the time between doses. The last dose that should be eliminated is the evening dose.      Post-operative opioid taper instructions:   Complete by: As directed    POST-OPERATIVE OPIOID TAPER INSTRUCTIONS: It is important to wean off of your opioid medication as soon as possible. If you do not need pain medication after your surgery it is ok to stop day one. Opioids include: Codeine, Hydrocodone(Norco, Vicodin), Oxycodone(Percocet, oxycontin) and hydromorphone amongst others.  Long term and even short term use of opiods can cause: Increased pain response Dependence Constipation Depression Respiratory depression And more.  Withdrawal symptoms can include Flu like symptoms Nausea, vomiting And more Techniques to  manage these symptoms Hydrate well Eat regular healthy meals Stay active Use relaxation techniques(deep breathing, meditating, yoga) Do Not substitute Alcohol to help with tapering If you have been on opioids for less than two weeks and do not have pain than it is ok to stop all together.  Plan to wean off of opioids This plan should start within one week post op of your joint replacement. Maintain the same interval or time between taking each dose and first decrease the dose.  Cut the total daily intake of opioids by one tablet each day Next start to increase the time between doses. The last dose that should be eliminated is the evening dose.      Post-operative opioid taper instructions:   Complete by: As directed  POST-OPERATIVE OPIOID TAPER INSTRUCTIONS: It is important to wean off of your opioid medication as soon as possible. If you do not need pain medication after your surgery it is ok to stop day one. Opioids include: Codeine, Hydrocodone(Norco, Vicodin), Oxycodone(Percocet, oxycontin) and hydromorphone amongst others.  Long term and even short term use of opiods can cause: Increased pain response Dependence Constipation Depression Respiratory depression And more.  Withdrawal symptoms can include Flu like symptoms Nausea, vomiting And more Techniques to manage these symptoms Hydrate well Eat regular healthy meals Stay active Use relaxation techniques(deep breathing, meditating, yoga) Do Not substitute Alcohol to help with tapering If you have been on opioids for less than two weeks and do not have pain than it is ok to stop all together.  Plan to wean off of opioids This plan should start within one week post op of your joint replacement. Maintain the same interval or time between taking each dose and first decrease the dose.  Cut the total daily intake of opioids by one tablet each day Next start to increase the time between doses. The last dose that should be  eliminated is the evening dose.      TED hose   Complete by: As directed    Use stockings (TED hose) for three weeks on both leg(s).  You may remove them at night for sleeping.   TED hose   Complete by: As directed    Use stockings (TED hose) for three weeks on both leg(s).  You may remove them at night for sleeping.   TED hose   Complete by: As directed    Use stockings (TED hose) for three weeks on both leg(s).  You may remove them at night for sleeping.   Weight bearing as tolerated   Complete by: As directed    Weight bearing as tolerated   Complete by: As directed    Weight bearing as tolerated   Complete by: As directed       Allergies as of 04/04/2021       Reactions   Atorvastatin    Other reaction(s): Cramps (ALLERGY/intolerance), Other   Penicillins Dermatitis, Rash   nails peels Tolerated Cephalosporin Date: 04/01/21.   Rosuvastatin    Other reaction(s): Cramps & muscle pain   Sulfa Antibiotics Nausea And Vomiting   Other reaction(s): Muscle Cramps   Cholestyramine Diarrhea   Gemfibrozil Diarrhea, Nausea And Vomiting        Medication List     STOP taking these medications    APPLE CIDER VINEGAR PO   Calcium + D3 600-800 MG-UNIT Tabs   cholecalciferol 25 MCG (1000 UNIT) tablet Commonly known as: VITAMIN D3   ondansetron 4 MG tablet Commonly known as: ZOFRAN       TAKE these medications    Accu-Chek Aviva Plus test strip Generic drug: glucose blood   Accu-Chek Aviva Plus w/Device Kit   Accu-Chek Guide Control Liqd See admin instructions.   Accu-Chek Softclix Lancets lancets   amLODipine-benazepril 10-40 MG capsule Commonly known as: LOTREL Take 1 capsule by mouth daily.   B-D SINGLE USE SWABS REGULAR Pads USE TO TEST BLOOD SUGAR ONCE A DAY   gabapentin 300 MG capsule Commonly known as: NEURONTIN Take a 300 mg capsule three times a day for two weeks following surgery.Then take a 300 mg capsule two times a day for two weeks. Then  take a 300 mg capsule once a day for two weeks. Then discontinue.   metFORMIN 500 MG 24  hr tablet Commonly known as: GLUCOPHAGE-XR Take 1,000 mg by mouth daily with breakfast. What changed: Another medication with the same name was removed. Continue taking this medication, and follow the directions you see here.   methocarbamol 500 MG tablet Commonly known as: ROBAXIN Take 1 tablet (500 mg total) by mouth every 6 (six) hours as needed for muscle spasms.   oxyCODONE 5 MG immediate release tablet Commonly known as: Oxy IR/ROXICODONE Take 1-2 tablets (5-10 mg total) by mouth every 6 (six) hours as needed for severe pain. Not to exceed 6 tablets a day.   rivaroxaban 10 MG Tabs tablet Commonly known as: XARELTO Take 1 tablet (10 mg total) by mouth daily with breakfast for 20 days. Then take one 81 mg aspirin once a day for three weeks. Then discontinue aspirin.   traMADol 50 MG tablet Commonly known as: ULTRAM Take 1-2 tablets (50-100 mg total) by mouth every 6 (six) hours as needed for moderate pain.   Vibegron 75 MG Tabs Take 75 mg by mouth daily.               Discharge Care Instructions  (From admission, onward)           Start     Ordered   04/03/21 0000  Weight bearing as tolerated        04/03/21 0803   04/03/21 0000  Change dressing       Comments: You may remove the bulky bandage (ACE wrap and gauze) two days after surgery. You will have an adhesive waterproof bandage underneath. Leave this in place until your first follow-up appointment.   04/03/21 0803   04/02/21 0000  Weight bearing as tolerated        04/02/21 0737   04/02/21 0000  Change dressing       Comments: You may remove the bulky bandage (ACE wrap and gauze) two days after surgery. You will have an adhesive waterproof bandage underneath. Leave this in place until your first follow-up appointment.   04/02/21 0737   04/01/21 0000  Weight bearing as tolerated        04/01/21 0747   04/01/21 0000   Change dressing       Comments: You may remove the bulky bandage (ACE wrap and gauze) two days after surgery. You will have an adhesive waterproof bandage underneath. Leave this in place until your first follow-up appointment.   04/01/21 0747            Follow-up Information     Jonnie Kind, PA-C. Go on 04/15/2021.   Specialty: Orthopedic Surgery Why: You are scheduled for a follow up appointment on 04-15-21 at 2:15 pm. Contact information: [address] [place] [address] [phone]                 Signed: Theresa Duty, PA-C Orthopedic Surgery 04/09/2021, 12:12 PM

## 2021-04-10 ENCOUNTER — Other Ambulatory Visit: Payer: Self-pay

## 2021-04-10 ENCOUNTER — Encounter (HOSPITAL_COMMUNITY): Payer: Self-pay

## 2021-04-10 ENCOUNTER — Emergency Department (HOSPITAL_COMMUNITY)
Admission: EM | Admit: 2021-04-10 | Discharge: 2021-04-10 | Disposition: A | Discharge status: Home / Self Care | Payer: Medicare HMO | Attending: Emergency Medicine | Admitting: Emergency Medicine

## 2021-04-10 DIAGNOSIS — R109 Unspecified abdominal pain: Secondary | ICD-10-CM | POA: Diagnosis present

## 2021-04-10 DIAGNOSIS — K644 Residual hemorrhoidal skin tags: Secondary | ICD-10-CM | POA: Insufficient documentation

## 2021-04-10 DIAGNOSIS — Z7984 Long term (current) use of oral hypoglycemic drugs: Secondary | ICD-10-CM | POA: Diagnosis not present

## 2021-04-10 DIAGNOSIS — Z7901 Long term (current) use of anticoagulants: Secondary | ICD-10-CM | POA: Insufficient documentation

## 2021-04-10 DIAGNOSIS — Z85038 Personal history of other malignant neoplasm of large intestine: Secondary | ICD-10-CM | POA: Insufficient documentation

## 2021-04-10 DIAGNOSIS — E119 Type 2 diabetes mellitus without complications: Secondary | ICD-10-CM | POA: Insufficient documentation

## 2021-04-10 DIAGNOSIS — Z96653 Presence of artificial knee joint, bilateral: Secondary | ICD-10-CM | POA: Diagnosis not present

## 2021-04-10 DIAGNOSIS — R1084 Generalized abdominal pain: Secondary | ICD-10-CM | POA: Diagnosis not present

## 2021-04-10 DIAGNOSIS — Z79899 Other long term (current) drug therapy: Secondary | ICD-10-CM | POA: Diagnosis not present

## 2021-04-10 DIAGNOSIS — K5641 Fecal impaction: Secondary | ICD-10-CM | POA: Insufficient documentation

## 2021-04-10 DIAGNOSIS — I10 Essential (primary) hypertension: Secondary | ICD-10-CM | POA: Insufficient documentation

## 2021-04-10 LAB — CBC WITH DIFFERENTIAL/PLATELET
Abs Immature Granulocytes: 0.05 10*3/uL (ref 0.00–0.07)
Basophils Absolute: 0 10*3/uL (ref 0.0–0.1)
Basophils Relative: 0 %
Eosinophils Absolute: 0.1 10*3/uL (ref 0.0–0.5)
Eosinophils Relative: 1 %
HCT: 32.1 % — ABNORMAL LOW (ref 36.0–46.0)
Hemoglobin: 10.5 g/dL — ABNORMAL LOW (ref 12.0–15.0)
Immature Granulocytes: 1 %
Lymphocytes Relative: 12 %
Lymphs Abs: 1 10*3/uL (ref 0.7–4.0)
MCH: 31.6 pg (ref 26.0–34.0)
MCHC: 32.7 g/dL (ref 30.0–36.0)
MCV: 96.7 fL (ref 80.0–100.0)
Monocytes Absolute: 0.8 10*3/uL (ref 0.1–1.0)
Monocytes Relative: 10 %
Neutro Abs: 5.9 10*3/uL (ref 1.7–7.7)
Neutrophils Relative %: 76 %
Platelets: 432 10*3/uL — ABNORMAL HIGH (ref 150–400)
RBC: 3.32 MIL/uL — ABNORMAL LOW (ref 3.87–5.11)
RDW: 14.5 % (ref 11.5–15.5)
WBC: 7.9 10*3/uL (ref 4.0–10.5)
nRBC: 0 % (ref 0.0–0.2)

## 2021-04-10 LAB — COMPREHENSIVE METABOLIC PANEL
ALT: 39 U/L (ref 0–44)
AST: 36 U/L (ref 15–41)
Albumin: 3.1 g/dL — ABNORMAL LOW (ref 3.5–5.0)
Alkaline Phosphatase: 53 U/L (ref 38–126)
Anion gap: 10 (ref 5–15)
BUN: 24 mg/dL — ABNORMAL HIGH (ref 8–23)
CO2: 22 mmol/L (ref 22–32)
Calcium: 9.2 mg/dL (ref 8.9–10.3)
Chloride: 105 mmol/L (ref 98–111)
Creatinine, Ser: 1.06 mg/dL — ABNORMAL HIGH (ref 0.44–1.00)
GFR, Estimated: 56 mL/min — ABNORMAL LOW (ref >60)
Glucose, Bld: 149 mg/dL — ABNORMAL HIGH (ref 70–99)
Potassium: 4.1 mmol/L (ref 3.5–5.1)
Sodium: 137 mmol/L (ref 135–145)
Total Bilirubin: 1.6 mg/dL — ABNORMAL HIGH (ref 0.3–1.2)
Total Protein: 7.2 g/dL (ref 6.5–8.1)

## 2021-04-10 LAB — LIPASE, BLOOD: Lipase: 24 U/L (ref 11–51)

## 2021-04-10 LAB — PROCALCITONIN: Procalcitonin: <0.1 ng/mL

## 2021-04-10 MED ORDER — LIDOCAINE HCL URETHRAL/MUCOSAL 2 % EX GEL
1.0000 "application " | Freq: Once | CUTANEOUS | Status: DC
  Filled 2021-04-10: qty 11

## 2021-04-10 MED ORDER — LIDOCAINE HCL URETHRAL/MUCOSAL 2 % EX GEL
1.0000 "application " | Freq: Two times a day (BID) | CUTANEOUS | Status: DC | PRN
  Administered 2021-04-10: 1
  Filled 2021-04-10: qty 11

## 2021-04-10 MED ORDER — SODIUM CHLORIDE 0.9 % IV BOLUS
500.0000 mL | Freq: Once | INTRAVENOUS | Status: AC
  Administered 2021-04-10: 500 mL via INTRAVENOUS

## 2021-04-10 MED ORDER — LIDOCAINE (ANORECTAL) 5 % EX CREA: TOPICAL_CREAM | CUTANEOUS | 3 refills | Status: DC

## 2021-04-10 MED ORDER — FENTANYL CITRATE PF 50 MCG/ML IJ SOSY
100.0000 ug | PREFILLED_SYRINGE | Freq: Once | INTRAMUSCULAR | Status: AC
  Administered 2021-04-10: 100 ug via INTRAVENOUS
  Filled 2021-04-10: qty 2

## 2021-04-10 MED ORDER — HYDROCORTISONE (PERIANAL) 2.5 % EX CREA: TOPICAL_CREAM | CUTANEOUS | 1 refills | Status: DC

## 2021-04-10 MED ORDER — ONDANSETRON 4 MG PO TBDP: 4.0000 mg | ORAL_TABLET | Freq: Once | ORAL | Status: DC | PRN

## 2021-04-10 MED ORDER — MILK AND MOLASSES ENEMA
1.0000 | Freq: Once | RECTAL | Status: AC
  Administered 2021-04-10: 240 mL via RECTAL
  Filled 2021-04-10: qty 240

## 2021-04-10 MED ORDER — ONDANSETRON HCL 4 MG/2ML IJ SOLN
4.0000 mg | Freq: Once | INTRAMUSCULAR | Status: AC
  Administered 2021-04-10: 4 mg via INTRAVENOUS
  Filled 2021-04-10: qty 2

## 2021-04-10 NOTE — ED Triage Notes (Signed)
Pt BIB EMS with complaints of abdominal pain. Pt has not had a bowel movement since she had her left knee surgery on November 28th.

## 2021-04-10 NOTE — ED Provider Notes (Signed)
[facility] DEPT Provider Note: Georgena Spurling, MD, FACEP  CSN: [id number] MRN: [id number] ARRIVAL: 04/10/21 at [place]  Abdominal Pain   HISTORY OF PRESENT ILLNESS  04/10/21 5:08 AM Christina [name] is a 70 y.o. female who had a left total knee arthroplasty by Dr. Wynelle Link 03/31/2021.  She has not had a bowel movement since that surgery.  She is here with abdominal pain primarily in her rectum and lower abdomen with associated abdominal distention.  She rates her pain as a 10 out of 10, aching in nature.  It is worse with palpation of the abdomen.  It is also worse with attempted bowel movements.  She has large, painful hemorrhoids which has made it difficult for her to manually disimpact herself.  She has tried a Sports administrator without success.   Past Medical History:  Diagnosis Date   Arthritis    OA RIGHT KNEE AND PAIN   Cancer ([place]) 05/04/2009   HX OF COLON CANCER; S/P SURGERY AND DID NOT HAVE TO HAVE CHEMO OR RADIATION   Diabetes mellitus without complication (HCC)    Hypertension     Past Surgical History:  Procedure Laterality Date   BREAST SURGERY     RIGHT AND LEFT BREAST TUMORS REMOVED - BENIGN   C-SECTIONS X 2  1981   1991   COLON SURGERY  2015   CYST EXCISION Left 2020   hand and ganglian cyst and 2021 Lt arm   FATTY TUMOR REMOVED FROM RIGHT HEEL     age 86   TONSILLECTOMY     AT AGE OF 12   TOTAL KNEE ARTHROPLASTY Right 08/02/2013   Procedure: RIGHT TOTAL KNEE ARTHROPLASTY;  Surgeon: Tobi Bastos, MD;  Location: [facility];  Service: Orthopedics;  Laterality: Right;   TOTAL KNEE ARTHROPLASTY Left 03/31/2021   Procedure: TOTAL KNEE ARTHROPLASTY;  Surgeon: Gaynelle Arabian, MD;  Location: [facility];  Service: Orthopedics;  Laterality: Left;    Family History  Problem Relation Age of Onset   Hypertension Mother    Heart attack Mother     Social History   Tobacco Use   Smoking status: Never   Smokeless tobacco: Never   Vaping Use   Vaping Use: Never used  Substance Use Topics   Alcohol use: No   Drug use: No    Prior to Admission medications   Medication Sig Start Date End Date Taking? Authorizing Provider  hydrocortisone (ANUSOL-HC) 2.5 % rectal cream Apply to hemorrhoids daily. 04/10/21  Yes Jermiah Howton, MD  Lidocaine, Anorectal, (RECTICARE) 5 % CREA Apply to hemorrhoids as needed for pain. 04/10/21  Yes Dencil Cayson, Jenny Reichmann, MD  ACCU-CHEK AVIVA PLUS test strip  01/18/19   [provider]  Accu-Chek Softclix Lancets lancets  01/17/19   [provider]  Alcohol Swabs (B-D SINGLE USE SWABS REGULAR) PADS USE TO TEST BLOOD SUGAR ONCE A DAY    [provider]  amLODipine-benazepril (LOTREL) 10-40 MG capsule Take 1 capsule by mouth daily. 12/26/18   [provider]  Blood Glucose Calibration (ACCU-CHEK GUIDE CONTROL) LIQD See admin instructions. 09/03/20   [provider]  Blood Glucose Monitoring Suppl (ACCU-CHEK AVIVA PLUS) w/Device KIT  01/18/19   [provider]  gabapentin (NEURONTIN) 300 MG capsule Take a 300 mg capsule three times a day for two weeks following surgery.Then take a 300 mg capsule two times a day for two weeks. Then take a 300 mg capsule once a day  for two weeks. Then discontinue. 04/01/21   Edmisten, Ok Anis, PA  metFORMIN (GLUCOPHAGE-XR) 500 MG 24 hr tablet Take 1,000 mg by mouth daily with breakfast. 04/12/19   [provider]  methocarbamol (ROBAXIN) 500 MG tablet Take 1 tablet (500 mg total) by mouth every 6 (six) hours as needed for muscle spasms. 04/01/21   Edmisten, Kristie L, PA  oxyCODONE (OXY IR/ROXICODONE) 5 MG immediate release tablet Take 1-2 tablets (5-10 mg total) by mouth every 6 (six) hours as needed for severe pain. Not to exceed 6 tablets a day. 04/01/21   Edmisten, Ok Anis, PA  rivaroxaban (XARELTO) 10 MG TABS tablet Take 1 tablet (10 mg total) by mouth daily with breakfast for 20 days. Then take one 81 mg aspirin once a  day for three weeks. Then discontinue aspirin. 04/01/21 04/21/21  Edmisten, Ok Anis, PA  traMADol (ULTRAM) 50 MG tablet Take 1-2 tablets (50-100 mg total) by mouth every 6 (six) hours as needed for moderate pain. 04/01/21   Edmisten, Kristie L, PA  Vibegron 75 MG TABS Take 75 mg by mouth daily.    [provider]    Allergies Atorvastatin, Penicillins, Rosuvastatin, Sulfa antibiotics, Cholestyramine, and Gemfibrozil   REVIEW OF SYSTEMS  Negative except as noted here or in the History of Present Illness.   PHYSICAL EXAMINATION  Initial Vital Signs Blood pressure (!) 180/84, pulse (!) 103, temperature 98.3 F (36.8 C), temperature source Oral, resp. rate 18, height _0  (1.549 m), weight 90.3 kg, SpO2 100 %.  Examination General: Well-developed, high BMI female in no acute distress; appearance consistent with age of record HENT: normocephalic; atraumatic Eyes: pupils equal, round and reactive to light; extraocular muscles intact Neck: supple Heart: regular rate and rhythm Lungs: clear to auscultation bilaterally Abdomen: soft; lower abdominal tenderness; distended; bowel sounds present Rectal: Large, tender hemorrhoids; fecal impaction Extremities: No deformity; healing incision left knee; pulses normal Neurologic: Awake, alert and oriented; motor function intact in all extremities and symmetric; no facial droop Skin: Warm and dry Psychiatric: Tearful   RESULTS  Summary of this visit's results, reviewed and interpreted by myself:   EKG Interpretation  Date/Time:    Ventricular Rate:    PR Interval:    QRS Duration:   QT Interval:    QTC Calculation:   R Axis:     Text Interpretation:         Laboratory Studies: Results for orders placed or performed during the hospital encounter of 04/10/21 (from the past 24 hour(s))  Lipase, blood     Status: None   Collection Time: 04/10/21  5:53 AM  Result Value Ref Range   Lipase 24 11 - 51 U/L  Comprehensive  metabolic panel     Status: Abnormal   Collection Time: 04/10/21  5:53 AM  Result Value Ref Range   Sodium 137 135 - 145 mmol/L   Potassium 4.1 3.5 - 5.1 mmol/L   Chloride 105 98 - 111 mmol/L   CO2 22 22 - 32 mmol/L   Glucose, Bld 149 (H) 70 - 99 mg/dL   BUN 24 (H) 8 - 23 mg/dL   Creatinine, Ser 1.06 (H) 0.44 - 1.00 mg/dL   Calcium 9.2 8.9 - 10.3 mg/dL   Total Protein 7.2 6.5 - 8.1 g/dL   Albumin 3.1 (L) 3.5 - 5.0 g/dL   AST 36 15 - 41 U/L   ALT 39 0 - 44 U/L   Alkaline Phosphatase 53 38 - 126 U/L  Total Bilirubin 1.6 (H) 0.3 - 1.2 mg/dL   GFR, Estimated 56 (L) >60 mL/min   Anion gap 10 5 - 15  CBC with Differential/Platelet     Status: Abnormal   Collection Time: 04/10/21  5:53 AM  Result Value Ref Range   WBC 7.9 4.0 - 10.5 K/uL   RBC 3.32 (L) 3.87 - 5.11 MIL/uL   Hemoglobin 10.5 (L) 12.0 - 15.0 g/dL   HCT 32.1 (L) 36.0 - 46.0 %   MCV 96.7 80.0 - 100.0 fL   MCH 31.6 26.0 - 34.0 pg   MCHC 32.7 30.0 - 36.0 g/dL   RDW 14.5 11.5 - 15.5 %   Platelets 432 (H) 150 - 400 K/uL   nRBC 0.0 0.0 - 0.2 %   Neutrophils Relative % 76 %   Neutro Abs 5.9 1.7 - 7.7 K/uL   Lymphocytes Relative 12 %   Lymphs Abs 1.0 0.7 - 4.0 K/uL   Monocytes Relative 10 %   Monocytes Absolute 0.8 0.1 - 1.0 K/uL   Eosinophils Relative 1 %   Eosinophils Absolute 0.1 0.0 - 0.5 K/uL   Basophils Relative 0 %   Basophils Absolute 0.0 0.0 - 0.1 K/uL   Immature Granulocytes 1 %   Abs Immature Granulocytes 0.05 0.00 - 0.07 K/uL  Procalcitonin     Status: None   Collection Time: 04/10/21  5:53 AM  Result Value Ref Range   Procalcitonin <0.10 ng/mL   Imaging Studies: No results found.  ED COURSE and MDM  Nursing notes, initial and subsequent vitals signs, including pulse oximetry, reviewed and interpreted by myself.  Vitals:   04/10/21 0506 04/10/21 0507  BP: (!) 180/84   Pulse: (!) 103   Resp: 18   Temp: 98.3 F (36.8 C)   TempSrc: Oral   SpO2: 100%   Weight:  90.3 kg  Height:  _0  (1.549 m)    Medications  ondansetron (ZOFRAN-ODT) disintegrating tablet 4 mg (has no administration in time range)  lidocaine (XYLOCAINE) 2 % jelly 1 application (1 application Other Given 04/10/21 0648)  sodium chloride 0.9 % bolus 500 mL (500 mLs Intravenous New Bag/Given 04/10/21 0647)  ondansetron (ZOFRAN) injection 4 mg (4 mg Intravenous Given 04/10/21 0551)  fentaNYL (SUBLIMAZE) injection 100 mcg (100 mcg Intravenous Given 04/10/21 0552)  milk and molasses enema (240 mLs Rectal Given 04/10/21 0645)   6:07 AM Patient not able to tolerate manual disimpaction due to large hemorrhoids.  Will try a milk of molasses enema.  7:25 AM Significant removal of impacted stool as well as liquid stool following milk of molasses enema.  Patient would like to try home care at this point with oral MiraLAX.  We will provide medications to treat her hemorrhoidal discomfort.  Patient procalcitonin is less than 0.1 which is not consistent with postoperative bacterial infection of her knee replacement.   PROCEDURES  Procedures   ED DIAGNOSES     ICD-10-CM   1. Fecal impaction ([place])  K56.41     2. External hemorrhoids  K64.4          Lachrista Heslin, Jenny Reichmann, MD 04/10/21 [phone]

## 2021-04-10 NOTE — ED Notes (Signed)
Administered milk and molasses enema in two installments. After half the enema, pt was able to defecate large volume of stool with some assistance and encouragement. Administered remaining enema and instructed pt to retain as able for 15 minutes. Pt attempted to shift to bedside commode, during which she expelled remainder of enema and two more instances of stool. Pt endorsed relief, felt like belly was more empty, and that she would be able to continue treatment course at home. Provider aware.

## 2021-04-11 DIAGNOSIS — M25562 Pain in left knee: Secondary | ICD-10-CM | POA: Diagnosis not present

## 2021-04-14 DIAGNOSIS — M25562 Pain in left knee: Secondary | ICD-10-CM | POA: Diagnosis not present

## 2021-04-16 DIAGNOSIS — M25562 Pain in left knee: Secondary | ICD-10-CM | POA: Diagnosis not present

## 2021-04-18 DIAGNOSIS — M25562 Pain in left knee: Secondary | ICD-10-CM | POA: Diagnosis not present

## 2021-04-22 DIAGNOSIS — M25562 Pain in left knee: Secondary | ICD-10-CM | POA: Diagnosis not present

## 2021-04-24 DIAGNOSIS — M25562 Pain in left knee: Secondary | ICD-10-CM | POA: Diagnosis not present

## 2021-04-30 DIAGNOSIS — M25562 Pain in left knee: Secondary | ICD-10-CM | POA: Diagnosis not present

## 2021-05-02 DIAGNOSIS — M25562 Pain in left knee: Secondary | ICD-10-CM | POA: Diagnosis not present

## 2021-05-06 DIAGNOSIS — M25562 Pain in left knee: Secondary | ICD-10-CM | POA: Diagnosis not present

## 2021-05-06 DIAGNOSIS — Z471 Aftercare following joint replacement surgery: Secondary | ICD-10-CM | POA: Diagnosis not present

## 2021-05-06 DIAGNOSIS — Z4789 Encounter for other orthopedic aftercare: Secondary | ICD-10-CM | POA: Diagnosis not present

## 2021-05-08 DIAGNOSIS — Z0000 Encounter for general adult medical examination without abnormal findings: Secondary | ICD-10-CM | POA: Diagnosis not present

## 2021-05-08 DIAGNOSIS — E7800 Pure hypercholesterolemia, unspecified: Secondary | ICD-10-CM | POA: Diagnosis not present

## 2021-05-08 DIAGNOSIS — I700 Atherosclerosis of aorta: Secondary | ICD-10-CM | POA: Diagnosis not present

## 2021-05-08 DIAGNOSIS — K862 Cyst of pancreas: Secondary | ICD-10-CM | POA: Diagnosis not present

## 2021-05-08 DIAGNOSIS — N183 Chronic kidney disease, stage 3 unspecified: Secondary | ICD-10-CM | POA: Diagnosis not present

## 2021-05-08 DIAGNOSIS — E1169 Type 2 diabetes mellitus with other specified complication: Secondary | ICD-10-CM | POA: Diagnosis not present

## 2021-05-08 DIAGNOSIS — I10 Essential (primary) hypertension: Secondary | ICD-10-CM | POA: Diagnosis not present

## 2021-05-08 DIAGNOSIS — G720 Drug-induced myopathy: Secondary | ICD-10-CM | POA: Diagnosis not present

## 2021-05-09 ENCOUNTER — Other Ambulatory Visit: Payer: Self-pay | Admitting: Family Medicine

## 2021-05-09 DIAGNOSIS — M25562 Pain in left knee: Secondary | ICD-10-CM | POA: Diagnosis not present

## 2021-05-09 DIAGNOSIS — K862 Cyst of pancreas: Secondary | ICD-10-CM

## 2021-05-12 ENCOUNTER — Ambulatory Visit: Payer: Medicare HMO | Admitting: Podiatry

## 2021-05-12 ENCOUNTER — Other Ambulatory Visit: Payer: Self-pay

## 2021-05-12 DIAGNOSIS — B351 Tinea unguium: Secondary | ICD-10-CM | POA: Diagnosis not present

## 2021-05-12 DIAGNOSIS — M79675 Pain in left toe(s): Secondary | ICD-10-CM | POA: Diagnosis not present

## 2021-05-12 DIAGNOSIS — M2042 Other hammer toe(s) (acquired), left foot: Secondary | ICD-10-CM | POA: Diagnosis not present

## 2021-05-12 DIAGNOSIS — L84 Corns and callosities: Secondary | ICD-10-CM | POA: Diagnosis not present

## 2021-05-12 DIAGNOSIS — M2041 Other hammer toe(s) (acquired), right foot: Secondary | ICD-10-CM

## 2021-05-12 DIAGNOSIS — M79674 Pain in right toe(s): Secondary | ICD-10-CM

## 2021-05-12 DIAGNOSIS — E119 Type 2 diabetes mellitus without complications: Secondary | ICD-10-CM | POA: Diagnosis not present

## 2021-05-12 NOTE — Progress Notes (Signed)
ANNUAL DIABETIC FOOT EXAM  Subjective: Christina [name] presents today for for annual diabetic foot examination.  Patient relates >5 year h/o diabetes.  Patient denies any h/o foot wounds.  Patient does not monitor blood glucose daily.  Christina Cruel, MD is patient's PCP. Last visit was 05/08/2020.  Past Medical History:  Diagnosis Date   Arthritis    OA RIGHT KNEE AND PAIN   Cancer ([place]) 05/04/2009   HX OF COLON CANCER; S/P SURGERY AND DID NOT HAVE TO HAVE CHEMO OR RADIATION   Diabetes mellitus without complication ([place])    Hypertension    Patient Active Problem List   Diagnosis Date Noted   Primary osteoarthritis of left knee 03/31/2021   Encounter for orthopedic follow-up care 01/02/2021   Mass of skin of left forearm 12/16/2020   Pain in joint of right hip 06/10/2020   Pain in joint of left knee 06/10/2020   Chronic kidney disease with active medical management without dialysis, stage 3 (moderate) ([place]) 05/08/2020   Chronic pain 05/08/2020   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   Hardening of the aorta (main artery of the heart) ([place]) 05/08/2020   Hypercholesterolemia 05/08/2020   Morbid obesity ([place]) 05/08/2020   Myalgia 05/08/2020   Osteopenia 05/08/2020   Sciatica 05/08/2020   Urgency of urination 05/08/2020   Urinary incontinence 05/08/2020   Statin not tolerated 05/08/2020   Adhesive capsulitis of left shoulder 08/08/2019   Mass of joint of left hand 02/15/2019   Mass of joint of left wrist 02/15/2019   Abdominal pain, vomiting, and diarrhea 01/12/2019   Renal insufficiency 01/12/2019   Adrenal nodule ([place]) 01/12/2019   Pain in thumb joint with movement, right 12/28/2018   Pain of left hand 12/28/2018   Degenerative scoliosis 05/23/2018   Degenerative spondylolisthesis 05/23/2018   Constipation 09/07/2013   Surgical wound infection 08/08/2013   Diabetes mellitus without complication ([place])    Hypertension    OA (osteoarthritis) of knee 08/02/2013   Total  knee replacement status 08/02/2013   History of colon cancer 06/19/2010   Past Surgical History:  Procedure Laterality Date   BREAST SURGERY     RIGHT AND LEFT BREAST TUMORS REMOVED - BENIGN   C-SECTIONS X 2  1981   1991   COLON SURGERY  2015   CYST EXCISION Left 2020   hand and ganglian cyst and 2021 Lt arm   FATTY TUMOR REMOVED FROM RIGHT HEEL     age 73   TONSILLECTOMY     AT AGE OF 12   TOTAL KNEE ARTHROPLASTY Right 08/02/2013   Procedure: RIGHT TOTAL KNEE ARTHROPLASTY;  Surgeon: Tobi Bastos, MD;  Location: [facility];  Service: Orthopedics;  Laterality: Right;   TOTAL KNEE ARTHROPLASTY Left 03/31/2021   Procedure: TOTAL KNEE ARTHROPLASTY;  Surgeon: Gaynelle Arabian, MD;  Location: [facility];  Service: Orthopedics;  Laterality: Left;   Current Outpatient Medications on File Prior to Visit  Medication Sig Dispense Refill   aspirin 81 MG EC tablet 1 tablet     ACCU-CHEK AVIVA PLUS test strip      Accu-Chek Softclix Lancets lancets      Alcohol Swabs (B-D SINGLE USE SWABS REGULAR) PADS USE TO TEST BLOOD SUGAR ONCE A DAY     amLODipine-benazepril (LOTREL) 10-40 MG capsule Take 1 capsule by mouth daily.     Blood Glucose Calibration (ACCU-CHEK GUIDE CONTROL) LIQD See admin instructions.     Blood Glucose Monitoring Suppl (ACCU-CHEK GUIDE) w/Device KIT See admin instructions.  gabapentin (NEURONTIN) 300 MG capsule Take a 300 mg capsule three times a day for two weeks following surgery.Then take a 300 mg capsule two times a day for two weeks. Then take a 300 mg capsule once a day for two weeks. Then discontinue. 84 capsule 0   hydrocortisone (ANUSOL-HC) 2.5 % rectal cream Apply to hemorrhoids daily. 30 g 1   Lidocaine, Anorectal, (RECTICARE) 5 % CREA Apply to hemorrhoids as needed for pain. 28 g 3   metFORMIN (GLUCOPHAGE-XR) 500 MG 24 hr tablet Take 1,000 mg by mouth daily with breakfast.     methocarbamol (ROBAXIN) 500 MG tablet Take 1 tablet (500 mg total) by mouth every 6 (six) hours  as needed for muscle spasms. 40 tablet 0   oxyCODONE (OXY IR/ROXICODONE) 5 MG immediate release tablet Take 1-2 tablets (5-10 mg total) by mouth every 6 (six) hours as needed for severe pain. Not to exceed 6 tablets a day. 42 tablet 0   traMADol (ULTRAM) 50 MG tablet Take 1-2 tablets (50-100 mg total) by mouth every 6 (six) hours as needed for moderate pain. 40 tablet 0   Vibegron 75 MG TABS Take 75 mg by mouth daily.     No current facility-administered medications on file prior to visit.    Allergies  Allergen Reactions   Atorvastatin     Other reaction(s): Cramps (ALLERGY/intolerance), Other Other reaction(s): muscle pain   Penicillins Dermatitis and Rash    nails peels Tolerated Cephalosporin Date: 04/01/21.   Other reaction(s): rash/ skin peel/ and nails come off   Rosuvastatin     Other reaction(s): Cramps & muscle pain Other reaction(s): muscle pain   Sulfa Antibiotics Nausea And Vomiting    Other reaction(s): Muscle Cramps   Cholestyramine Diarrhea    Other reaction(s): diarrhea   Gemfibrozil Diarrhea and Nausea And Vomiting    Other reaction(s): vomiting and diarrhea   Pravastatin     Other reaction(s): muscle pain   Social History   Occupational History   Not on file  Tobacco Use   Smoking status: Never   Smokeless tobacco: Never  Vaping Use   Vaping Use: Never used  Substance and Sexual Activity   Alcohol use: No   Drug use: No   Sexual activity: Not on file   Family History  Problem Relation Age of Onset   Hypertension Mother    Heart attack Mother    Immunization History  Administered Date(s) Administered   Influenza Split 03/19/2009   Influenza,inj,Quad PF,6+ Mos 02/24/2016, 03/15/2017   Moderna Sars-Covid-2 Vaccination 06/16/2019, 07/14/2019, 03/06/2020   Pneumococcal Conjugate-13 05/23/2015   Pneumococcal Polysaccharide-23 03/19/2009   Td 01/01/2017   Tdap 04/04/2007   Zoster, Live 10/06/2012     Review of Systems: Negative except as noted  in the HPI.   Objective: There were no vitals filed for this visit.  Christina [name] is a pleasant 71 y.o. female in NAD. AAO X 3.  Vascular Examination: CFT immediate b/l LE. Palpable DP/PT pulses b/l LE. Digital hair sparse b/l. Skin temperature gradient WNL b/l. No pain with calf compression b/l. Trace edema b/l LE. No cyanosis or clubbing noted b/l LE.  Dermatological Examination: Pedal integument with normal turgor, texture and tone b/l LE. No open wounds b/l. No interdigital macerations b/l. Toenails 1-5 b/l elongated, thickened, discolored with subungual debris. +Tenderness with dorsal palpation of nailplates. Hyperkeratotic lesion(s) noted L 2nd toe, R hallux, R 5th toe, and submet head 5 b/l.  Musculoskeletal Examination: Normal muscle strength 5/5  to all lower extremity muscle groups bilaterally. Hammertoe(s) noted to the L 5th toe and R 5th toe.Marland Kitchen No pain, crepitus or joint limitation noted with ROM b/l LE.  Patient ambulates independently without assistive aids.  Footwear Assessment: Does the patient wear appropriate shoes? Yes. Does the patient need inserts/orthotics? Yes.  Neurological Examination: Protective sensation intact 5/5 intact bilaterally with 10g monofilament b/l. Vibratory sensation intact b/l.  Hemoglobin A1C Latest Ref Rng & Units 03/24/2021  HGBA1C 4.8 - 5.6 % 6.1(H)  Some recent data might be hidden   Assessment: 1. Pain due to onychomycosis of toenails of both feet   2. Corns and callosities   3. Acquired hammertoes of both feet   4. Controlled type 2 diabetes mellitus without complication, without long-term current use of insulin ([place])   5. Encounter for diabetic foot exam ([place])      ADA Risk Categorization: Low Risk :  Patient has all of the following: Intact protective sensation No prior foot ulcer  No severe deformity Pedal pulses present  Plan: -Diabetic foot examination performed today. -Continue foot and shoe inspections daily. Monitor  blood glucose per PCP/Endocrinologist's recommendations. -Mycotic toenails 1-5 bilaterally were debrided in length and girth with sterile nail nippers and dremel without incident. -Corn(s) L 2nd toe and R 5th toe and callus(es) R hallux and submet head 5 b/l were pared utilizing sterile scalpel blade without incident. Total number debrided =5. -Patient/POA to call should there be question/concern in the interim.  Return in about 3 months (around 08/10/2021).  Marzetta Board, DPM

## 2021-05-13 DIAGNOSIS — M25562 Pain in left knee: Secondary | ICD-10-CM | POA: Diagnosis not present

## 2021-05-16 DIAGNOSIS — M25562 Pain in left knee: Secondary | ICD-10-CM | POA: Diagnosis not present

## 2021-05-17 ENCOUNTER — Encounter: Payer: Self-pay | Admitting: Podiatry

## 2021-05-20 ENCOUNTER — Other Ambulatory Visit (HOSPITAL_COMMUNITY): Payer: Self-pay | Admitting: Orthopedic Surgery

## 2021-05-20 DIAGNOSIS — M79606 Pain in leg, unspecified: Secondary | ICD-10-CM | POA: Insufficient documentation

## 2021-05-20 DIAGNOSIS — Z96652 Presence of left artificial knee joint: Secondary | ICD-10-CM | POA: Diagnosis not present

## 2021-05-20 DIAGNOSIS — M7989 Other specified soft tissue disorders: Secondary | ICD-10-CM

## 2021-05-20 DIAGNOSIS — M79605 Pain in left leg: Secondary | ICD-10-CM

## 2021-05-21 ENCOUNTER — Other Ambulatory Visit: Payer: Self-pay

## 2021-05-21 ENCOUNTER — Ambulatory Visit (HOSPITAL_COMMUNITY)
Admission: RE | Admit: 2021-05-21 | Discharge: 2021-05-21 | Disposition: A | Discharge status: Home / Self Care | Payer: Medicare HMO | Source: Ambulatory Visit | Attending: Cardiovascular Disease | Admitting: Cardiovascular Disease

## 2021-05-21 DIAGNOSIS — M79605 Pain in left leg: Secondary | ICD-10-CM | POA: Insufficient documentation

## 2021-05-21 DIAGNOSIS — M7989 Other specified soft tissue disorders: Secondary | ICD-10-CM | POA: Diagnosis not present

## 2021-05-28 ENCOUNTER — Other Ambulatory Visit: Payer: Self-pay

## 2021-05-28 ENCOUNTER — Emergency Department (HOSPITAL_BASED_OUTPATIENT_CLINIC_OR_DEPARTMENT_OTHER): Payer: Medicare HMO

## 2021-05-28 ENCOUNTER — Inpatient Hospital Stay (HOSPITAL_BASED_OUTPATIENT_CLINIC_OR_DEPARTMENT_OTHER)
Admission: EM | Admit: 2021-05-28 | Discharge: 2021-06-02 | DRG: 390 | Disposition: A | Discharge status: Home / Self Care | Payer: Medicare HMO | Attending: Family Medicine | Admitting: Family Medicine

## 2021-05-28 ENCOUNTER — Encounter (HOSPITAL_BASED_OUTPATIENT_CLINIC_OR_DEPARTMENT_OTHER): Payer: Self-pay

## 2021-05-28 ENCOUNTER — Emergency Department (HOSPITAL_BASED_OUTPATIENT_CLINIC_OR_DEPARTMENT_OTHER): Payer: Medicare HMO | Admitting: Radiology

## 2021-05-28 DIAGNOSIS — E669 Obesity, unspecified: Secondary | ICD-10-CM | POA: Diagnosis present

## 2021-05-28 DIAGNOSIS — N183 Chronic kidney disease, stage 3 unspecified: Secondary | ICD-10-CM | POA: Diagnosis present

## 2021-05-28 DIAGNOSIS — K56600 Partial intestinal obstruction, unspecified as to cause: Principal | ICD-10-CM | POA: Diagnosis present

## 2021-05-28 DIAGNOSIS — Z79899 Other long term (current) drug therapy: Secondary | ICD-10-CM

## 2021-05-28 DIAGNOSIS — Z7984 Long term (current) use of oral hypoglycemic drugs: Secondary | ICD-10-CM

## 2021-05-28 DIAGNOSIS — K529 Noninfective gastroenteritis and colitis, unspecified: Secondary | ICD-10-CM | POA: Diagnosis present

## 2021-05-28 DIAGNOSIS — J069 Acute upper respiratory infection, unspecified: Secondary | ICD-10-CM | POA: Diagnosis not present

## 2021-05-28 DIAGNOSIS — E876 Hypokalemia: Secondary | ICD-10-CM

## 2021-05-28 DIAGNOSIS — Z8249 Family history of ischemic heart disease and other diseases of the circulatory system: Secondary | ICD-10-CM

## 2021-05-28 DIAGNOSIS — Z882 Allergy status to sulfonamides status: Secondary | ICD-10-CM

## 2021-05-28 DIAGNOSIS — K56609 Unspecified intestinal obstruction, unspecified as to partial versus complete obstruction: Secondary | ICD-10-CM | POA: Diagnosis not present

## 2021-05-28 DIAGNOSIS — Z4682 Encounter for fitting and adjustment of non-vascular catheter: Secondary | ICD-10-CM | POA: Diagnosis not present

## 2021-05-28 DIAGNOSIS — Z6836 Body mass index (BMI) 36.0-36.9, adult: Secondary | ICD-10-CM

## 2021-05-28 DIAGNOSIS — K6389 Other specified diseases of intestine: Secondary | ICD-10-CM | POA: Diagnosis not present

## 2021-05-28 DIAGNOSIS — I722 Aneurysm of renal artery: Secondary | ICD-10-CM

## 2021-05-28 DIAGNOSIS — R1010 Upper abdominal pain, unspecified: Secondary | ICD-10-CM | POA: Diagnosis not present

## 2021-05-28 DIAGNOSIS — N1831 Chronic kidney disease, stage 3a: Secondary | ICD-10-CM | POA: Diagnosis present

## 2021-05-28 DIAGNOSIS — Z880 Allergy status to penicillin: Secondary | ICD-10-CM | POA: Diagnosis not present

## 2021-05-28 DIAGNOSIS — Z85038 Personal history of other malignant neoplasm of large intestine: Secondary | ICD-10-CM | POA: Diagnosis not present

## 2021-05-28 DIAGNOSIS — R109 Unspecified abdominal pain: Secondary | ICD-10-CM | POA: Diagnosis not present

## 2021-05-28 DIAGNOSIS — E1122 Type 2 diabetes mellitus with diabetic chronic kidney disease: Secondary | ICD-10-CM | POA: Diagnosis present

## 2021-05-28 DIAGNOSIS — N281 Cyst of kidney, acquired: Secondary | ICD-10-CM | POA: Diagnosis not present

## 2021-05-28 DIAGNOSIS — I129 Hypertensive chronic kidney disease with stage 1 through stage 4 chronic kidney disease, or unspecified chronic kidney disease: Secondary | ICD-10-CM | POA: Diagnosis present

## 2021-05-28 DIAGNOSIS — I10 Essential (primary) hypertension: Secondary | ICD-10-CM | POA: Diagnosis not present

## 2021-05-28 DIAGNOSIS — K439 Ventral hernia without obstruction or gangrene: Secondary | ICD-10-CM

## 2021-05-28 DIAGNOSIS — Z96653 Presence of artificial knee joint, bilateral: Secondary | ICD-10-CM | POA: Diagnosis present

## 2021-05-28 DIAGNOSIS — Z888 Allergy status to other drugs, medicaments and biological substances status: Secondary | ICD-10-CM | POA: Diagnosis not present

## 2021-05-28 DIAGNOSIS — Z20822 Contact with and (suspected) exposure to covid-19: Secondary | ICD-10-CM | POA: Diagnosis present

## 2021-05-28 DIAGNOSIS — K5669 Other partial intestinal obstruction: Secondary | ICD-10-CM | POA: Diagnosis not present

## 2021-05-28 DIAGNOSIS — D751 Secondary polycythemia: Secondary | ICD-10-CM | POA: Diagnosis present

## 2021-05-28 DIAGNOSIS — R5381 Other malaise: Secondary | ICD-10-CM

## 2021-05-28 LAB — COMPREHENSIVE METABOLIC PANEL
ALT: 11 U/L (ref 0–44)
AST: 14 U/L — ABNORMAL LOW (ref 15–41)
Albumin: 4.3 g/dL (ref 3.5–5.0)
Alkaline Phosphatase: 72 U/L (ref 38–126)
Anion gap: 16 — ABNORMAL HIGH (ref 5–15)
BUN: 18 mg/dL (ref 8–23)
CO2: 23 mmol/L (ref 22–32)
Calcium: 10.1 mg/dL (ref 8.9–10.3)
Chloride: 101 mmol/L (ref 98–111)
Creatinine, Ser: 1.22 mg/dL — ABNORMAL HIGH (ref 0.44–1.00)
GFR, Estimated: 47 mL/min — ABNORMAL LOW (ref >60)
Glucose, Bld: 205 mg/dL — ABNORMAL HIGH (ref 70–99)
Potassium: 3.3 mmol/L — ABNORMAL LOW (ref 3.5–5.1)
Sodium: 140 mmol/L (ref 135–145)
Total Bilirubin: 0.6 mg/dL (ref 0.3–1.2)
Total Protein: 8 g/dL (ref 6.5–8.1)

## 2021-05-28 LAB — CBC
HCT: 46.4 % — ABNORMAL HIGH (ref 36.0–46.0)
Hemoglobin: 15.2 g/dL — ABNORMAL HIGH (ref 12.0–15.0)
MCH: 31.2 pg (ref 26.0–34.0)
MCHC: 32.8 g/dL (ref 30.0–36.0)
MCV: 95.3 fL (ref 80.0–100.0)
Platelets: 364 10*3/uL (ref 150–400)
RBC: 4.87 MIL/uL (ref 3.87–5.11)
RDW: 15.1 % (ref 11.5–15.5)
WBC: 8 10*3/uL (ref 4.0–10.5)
nRBC: 0 % (ref 0.0–0.2)

## 2021-05-28 LAB — LACTIC ACID, PLASMA: Lactic Acid, Venous: 2.2 mmol/L (ref 0.5–1.9)

## 2021-05-28 LAB — LIPASE, BLOOD: Lipase: <10 U/L — ABNORMAL LOW (ref 11–51)

## 2021-05-28 MED ORDER — ONDANSETRON HCL 4 MG/2ML IJ SOLN
4.0000 mg | Freq: Once | INTRAMUSCULAR | Status: AC | PRN
  Administered 2021-05-28: 22:00:00 4 mg via INTRAVENOUS
  Filled 2021-05-28: qty 2

## 2021-05-28 MED ORDER — IOHEXOL 300 MG/ML  SOLN
100.0000 mL | Freq: Once | INTRAMUSCULAR | Status: AC | PRN
  Administered 2021-05-28: 23:00:00 100 mL via INTRAVENOUS

## 2021-05-28 MED ORDER — SODIUM CHLORIDE 0.9 % IV BOLUS
1000.0000 mL | Freq: Once | INTRAVENOUS | Status: AC
  Administered 2021-05-28: 23:00:00 1000 mL via INTRAVENOUS

## 2021-05-28 NOTE — ED Triage Notes (Signed)
Pt c/o upper abd pain with N/V/D that started yesterday. Pt reports many episodes of vomiting in last 24 hours.

## 2021-05-28 NOTE — ED Notes (Signed)
Patient transported to CT 

## 2021-05-28 NOTE — ED Provider Notes (Addendum)
[facility] DEPT Provider Note   CSN: [id number] Arrival date & time: 05/28/21  2141     History  Chief Complaint  Patient presents with   Abdominal Pain    Christina [name] is a 71 y.o. female.   Abdominal Pain Associated symptoms: diarrhea, nausea and vomiting   Associated symptoms: no chest pain and no shortness of breath   Patient presents abdominal pain.  Began yesterday.  Nausea vomiting diarrhea.  States pain is somewhat diffuse but worse in the lower abdomen.  No known sick contacts.  No one else with similar symptoms.  No blood in the emesis diarrhea.  States pain is severe.  Reviewing records appears to have previous hernias.  Unknown if any swelling in her abdomen is new at this time.  Past Medical History:  Diagnosis Date   Arthritis    OA RIGHT KNEE AND PAIN   Cancer ([place]) 05/04/2009   HX OF COLON CANCER; S/P SURGERY AND DID NOT HAVE TO HAVE CHEMO OR RADIATION   Diabetes mellitus without complication (HCC)    Hypertension       Past Surgical History:  Procedure Laterality Date   BREAST SURGERY     RIGHT AND LEFT BREAST TUMORS REMOVED - BENIGN   C-SECTIONS X 2  1981   1991   COLON SURGERY  2015   CYST EXCISION Left 2020   hand and ganglian cyst and 2021 Lt arm   FATTY TUMOR REMOVED FROM RIGHT HEEL     age 89   TONSILLECTOMY     AT AGE OF 12   TOTAL KNEE ARTHROPLASTY Right 08/02/2013   Procedure: RIGHT TOTAL KNEE ARTHROPLASTY;  Surgeon: Tobi Bastos, MD;  Location: [facility];  Service: Orthopedics;  Laterality: Right;   TOTAL KNEE ARTHROPLASTY Left 03/31/2021   Procedure: TOTAL KNEE ARTHROPLASTY;  Surgeon: Gaynelle Arabian, MD;  Location: [facility];  Service: Orthopedics;  Laterality: Left;    Home Medications Prior to Admission medications   Medication Sig Start Date End Date Taking? Authorizing Provider  ACCU-CHEK AVIVA PLUS test strip  01/18/19   [provider]  Accu-Chek Softclix Lancets lancets  01/17/19    [provider]  Alcohol Swabs (B-D SINGLE USE SWABS REGULAR) PADS USE TO TEST BLOOD SUGAR ONCE A DAY    [provider]  amLODipine-benazepril (LOTREL) 10-40 MG capsule Take 1 capsule by mouth daily. 12/26/18   [provider]  Blood Glucose Calibration (ACCU-CHEK GUIDE CONTROL) LIQD See admin instructions. 09/03/20   [provider]  Blood Glucose Monitoring Suppl (ACCU-CHEK GUIDE) w/Device KIT See admin instructions.    [provider]  gabapentin (NEURONTIN) 300 MG capsule Take a 300 mg capsule three times a day for two weeks following surgery.Then take a 300 mg capsule two times a day for two weeks. Then take a 300 mg capsule once a day for two weeks. Then discontinue. 04/01/21   Edmisten, Ok Anis, PA  hydrocortisone (ANUSOL-HC) 2.5 % rectal cream Apply to hemorrhoids daily. 04/10/21   Molpus, John, MD  Lidocaine, Anorectal, (RECTICARE) 5 % CREA Apply to hemorrhoids as needed for pain. 04/10/21   Molpus, Jenny Reichmann, MD  metFORMIN (GLUCOPHAGE-XR) 500 MG 24 hr tablet Take 1,000 mg by mouth daily with breakfast. 04/12/19   [provider]  methocarbamol (ROBAXIN) 500 MG tablet Take 1 tablet (500 mg total) by mouth every 6 (six) hours as needed for muscle spasms. 04/01/21   Edmisten, Kristie L, PA  oxyCODONE (OXY IR/ROXICODONE) 5 MG  immediate release tablet Take 1-2 tablets (5-10 mg total) by mouth every 6 (six) hours as needed for severe pain. Not to exceed 6 tablets a day. 04/01/21   Edmisten, Ok Anis, PA  traMADol (ULTRAM) 50 MG tablet Take 1-2 tablets (50-100 mg total) by mouth every 6 (six) hours as needed for moderate pain. 04/01/21   Edmisten, Kristie L, PA  Vibegron 75 MG TABS Take 75 mg by mouth daily.    [provider]      Allergies    Atorvastatin, Penicillins, Rosuvastatin, Sulfa antibiotics, Cholestyramine, Gemfibrozil, and Pravastatin    Review of Systems   Review of Systems  Constitutional:  Positive for appetite change.   HENT:  Negative for congestion.   Respiratory:  Negative for shortness of breath.   Cardiovascular:  Negative for chest pain.  Gastrointestinal:  Positive for abdominal pain, diarrhea, nausea and vomiting.  Genitourinary:  Negative for flank pain.  Musculoskeletal:  Negative for back pain.  Skin:  Negative for rash.  Neurological:  Negative for weakness.   Physical Exam Updated Vital Signs BP 123/77    Pulse 89    Temp 100.1 F (37.8 C) (Oral)    Resp 19    Ht '5\' 2"'  (1.575 m)    Wt 90.7 kg    SpO2 93%    BMI 36.58 kg/m  Physical Exam Vitals and nursing note reviewed.  HENT:     Head: Atraumatic.  Cardiovascular:     Rate and Rhythm: Normal rate.  Pulmonary:     Effort: Pulmonary effort is normal.  Abdominal:     Hernia: A hernia is present.     Comments: Periumbilical mass.  Approximately 5 cm.  Some tenderness.  Otherwise mild diffuse tenderness.  Skin:    General: Skin is warm.     Capillary Refill: Capillary refill takes less than 2 seconds.  Neurological:     Mental Status: She is alert.    ED Results / Procedures / Treatments   Labs (all labs ordered are listed, but only abnormal results are displayed) Labs Reviewed  LIPASE, BLOOD - Abnormal; Notable for the following components:      Result Value   Lipase <10 (*)    All other components within normal limits  COMPREHENSIVE METABOLIC PANEL - Abnormal; Notable for the following components:   Potassium 3.3 (*)    Glucose, Bld 205 (*)    Creatinine, Ser 1.22 (*)    AST 14 (*)    GFR, Estimated 47 (*)    Anion gap 16 (*)    All other components within normal limits  CBC - Abnormal; Notable for the following components:   Hemoglobin 15.2 (*)    HCT 46.4 (*)    All other components within normal limits  LACTIC ACID, PLASMA - Abnormal; Notable for the following components:   Lactic Acid, Venous 2.2 (*)    All other components within normal limits  RESP PANEL BY RT-PCR (FLU A&B, COVID) ARPGX2  GASTROINTESTINAL  PANEL BY PCR, STOOL (REPLACES STOOL CULTURE)  URINALYSIS, ROUTINE W REFLEX MICROSCOPIC  LACTIC ACID, PLASMA    EKG EKG Interpretation  Date/Time:  Wednesday May 28 2021 21:52:27 EST Ventricular Rate:  98 PR Interval:  151 QRS Duration: 86 QT Interval:  343 QTC Calculation: 438 R Axis:   -78 Text Interpretation: duplicate Confirmed by Davonna Belling [phone]) on 05/28/2021 11:02:04 PM  Radiology DG Chest 2 View  Result Date: 05/28/2021 CLINICAL DATA:  Infection. EXAM: CHEST - 2  VIEW COMPARISON:  Chest x-ray 07/24/2013. FINDINGS: The heart size and mediastinal contours are within normal limits. Both lungs are clear. The visualized skeletal structures are unremarkable. IMPRESSION: No active cardiopulmonary disease. Electronically Signed   By: Ronney Asters M.D.   On: 05/28/2021 22:24   CT ABDOMEN PELVIS W CONTRAST  Result Date: 05/28/2021 CLINICAL DATA:  Upper abdominal pain EXAM: CT ABDOMEN AND PELVIS WITH CONTRAST TECHNIQUE: Multidetector CT imaging of the abdomen and pelvis was performed using the standard protocol following bolus administration of intravenous contrast. RADIATION DOSE REDUCTION: This exam was performed according to the departmental dose-optimization program which includes automated exposure control, adjustment of the mA and/or kV according to patient size and/or use of iterative reconstruction technique. CONTRAST:  126m OMNIPAQUE IOHEXOL 300 MG/ML  SOLN COMPARISON:  01/12/2019 FINDINGS: Lower chest: No acute abnormality. Small to moderate-sized hiatal hernia. Hepatobiliary: No focal hepatic abnormality. Gallbladder unremarkable. Pancreas: No focal abnormality or ductal dilatation. Spleen: No focal abnormality.  Normal size. Adrenals/Urinary Tract: Adrenal glands unremarkable. 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 Decreasing size of right upper pole cyst, now measuring 1.6 cm compared to 3.7 cm previously. No hydronephrosis.  Urinary bladder unremarkable. Stomach/Bowel: Postoperative changes in the right colon. There are multiple thick walled small bowel loops seen in the central and left abdomen. These loops are also mildly dilated and fluid-filled. Distal small bowel loops are decompressed and unremarkable. Vascular/Lymphatic: Aortic atherosclerosis. No evidence of aneurysm or adenopathy. Reproductive: Uterus and adnexa unremarkable.  No mass. Other: There is a midline ventral hernia noted containing fat and a small bowel loop. This does not appear to be the cause of the dilated small bowel. There is fluid also within the hernia sac. Fluid adjacent to the dilated, thick walled left abdominal small bowel loops as well as adjacent to the liver. No free air. Musculoskeletal: No acute bony abnormality. IMPRESSION: Thick walled, dilated small bowel loops in the mid to left abdomen. Proximal to these thickened small bowel loops, jejunal small bowel loops are mildly dilated and fluid-filled. Findings are concerning for enteritis with a degree of small-bowel obstruction. This most likely reflects infectious or inflammatory enteritis. Mesenteric vessels are patent making ischemic enteritis less likely. Anterior lower abdominal wall ventral hernia containing fat, fluid and a small bowel loop. This does not appear to be the cause of the small bowel obstruction. Free fluid adjacent to the dilated thick-walled small bowel loops as well as adjacent to the liver. 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 Moderate hiatal hernia. Electronically Signed   By: KRolm BaptiseM.D.   On: 05/28/2021 23:38    Procedures Procedures    Medications Ordered in ED Medications  ondansetron ([facility] injection 4 mg (4 mg Intravenous Given 05/28/21 2158)  sodium chloride 0.9 % bolus 1,000 mL (1,000 mLs Intravenous New Bag/Given 05/28/21 2246)  iohexol (OMNIPAQUE) 300 MG/ML solution 100 mL (100 mLs Intravenous Contrast Given 05/28/21 2319)    ED  Course/ Medical Decision Making/ A&P Clinical Course as of 05/29/21 0015  Thu May 29, 2021  0014 Discussed with Dr. CFlorene Routefrom the hospitalist. He accepted transfer and will place NG tube [NP]    Clinical Course User Index [NP] PDavonna Belling MD                           Medical Decision Making Amount and/or Complexity of Data Reviewed External Data Reviewed: radiology and notes.  Details: previous admission Labs: ordered. Decision-making details documented in ED Course. Radiology: ordered. ECG/medicine tests: independent interpretation performed. Decision-making details documented in ED Course.  Risk Prescription drug management.  Patient abdominal pain nausea and vomiting.  Began yesterday.  Many episodes of vomiting.  States still having diarrhea also.  White count reassuring.  Hemoglobin interpreted and elevated.  Creatinine mildly elevated but at baseline.  Normal lipase.  CT from 2 years ago reviewed and showed periumbilical hernia/mass.  Continued pain.  Will get CT scan here.     CT scan resulted and showed possible small bowel obstruction partial from enteritis.  Does have a periumbilical hernia but not the cause of the obstruction.  Patient feeling somewhat better after treatment but I feel she will require admission to the hospital.  Will discuss with hospitalist. I have personally reviewed and independently interpreted the CT scan.         Final Clinical Impression(s) / ED Diagnoses Final diagnoses:  Enteritis  Partial small bowel obstruction [facility])    Rx / DC Orders ED Discharge Orders     None         Davonna Belling, MD 05/28/21 2332    Davonna Belling, MD 05/28/21 2354

## 2021-05-29 ENCOUNTER — Emergency Department (HOSPITAL_BASED_OUTPATIENT_CLINIC_OR_DEPARTMENT_OTHER): Payer: Medicare HMO | Admitting: Radiology

## 2021-05-29 ENCOUNTER — Encounter (HOSPITAL_COMMUNITY): Payer: Self-pay | Admitting: Family Medicine

## 2021-05-29 ENCOUNTER — Inpatient Hospital Stay (HOSPITAL_COMMUNITY): Payer: Medicare HMO

## 2021-05-29 DIAGNOSIS — I722 Aneurysm of renal artery: Secondary | ICD-10-CM | POA: Diagnosis present

## 2021-05-29 DIAGNOSIS — I10 Essential (primary) hypertension: Secondary | ICD-10-CM | POA: Diagnosis not present

## 2021-05-29 DIAGNOSIS — Z6836 Body mass index (BMI) 36.0-36.9, adult: Secondary | ICD-10-CM | POA: Diagnosis not present

## 2021-05-29 DIAGNOSIS — Z79899 Other long term (current) drug therapy: Secondary | ICD-10-CM | POA: Diagnosis not present

## 2021-05-29 DIAGNOSIS — K529 Noninfective gastroenteritis and colitis, unspecified: Secondary | ICD-10-CM | POA: Diagnosis not present

## 2021-05-29 DIAGNOSIS — Z96653 Presence of artificial knee joint, bilateral: Secondary | ICD-10-CM | POA: Diagnosis present

## 2021-05-29 DIAGNOSIS — K56609 Unspecified intestinal obstruction, unspecified as to partial versus complete obstruction: Secondary | ICD-10-CM

## 2021-05-29 DIAGNOSIS — Z888 Allergy status to other drugs, medicaments and biological substances status: Secondary | ICD-10-CM | POA: Diagnosis not present

## 2021-05-29 DIAGNOSIS — Z880 Allergy status to penicillin: Secondary | ICD-10-CM | POA: Diagnosis not present

## 2021-05-29 DIAGNOSIS — K56600 Partial intestinal obstruction, unspecified as to cause: Secondary | ICD-10-CM | POA: Diagnosis present

## 2021-05-29 DIAGNOSIS — N1831 Chronic kidney disease, stage 3a: Secondary | ICD-10-CM

## 2021-05-29 DIAGNOSIS — Z85038 Personal history of other malignant neoplasm of large intestine: Secondary | ICD-10-CM | POA: Diagnosis not present

## 2021-05-29 DIAGNOSIS — E1122 Type 2 diabetes mellitus with diabetic chronic kidney disease: Secondary | ICD-10-CM | POA: Diagnosis not present

## 2021-05-29 DIAGNOSIS — Z20822 Contact with and (suspected) exposure to covid-19: Secondary | ICD-10-CM | POA: Diagnosis present

## 2021-05-29 DIAGNOSIS — D751 Secondary polycythemia: Secondary | ICD-10-CM | POA: Diagnosis present

## 2021-05-29 DIAGNOSIS — N183 Chronic kidney disease, stage 3 unspecified: Secondary | ICD-10-CM | POA: Diagnosis present

## 2021-05-29 DIAGNOSIS — Z7984 Long term (current) use of oral hypoglycemic drugs: Secondary | ICD-10-CM | POA: Diagnosis not present

## 2021-05-29 DIAGNOSIS — Z882 Allergy status to sulfonamides status: Secondary | ICD-10-CM | POA: Diagnosis not present

## 2021-05-29 DIAGNOSIS — I129 Hypertensive chronic kidney disease with stage 1 through stage 4 chronic kidney disease, or unspecified chronic kidney disease: Secondary | ICD-10-CM | POA: Diagnosis present

## 2021-05-29 DIAGNOSIS — Z8249 Family history of ischemic heart disease and other diseases of the circulatory system: Secondary | ICD-10-CM | POA: Diagnosis not present

## 2021-05-29 DIAGNOSIS — Z4682 Encounter for fitting and adjustment of non-vascular catheter: Secondary | ICD-10-CM | POA: Diagnosis not present

## 2021-05-29 DIAGNOSIS — E876 Hypokalemia: Secondary | ICD-10-CM

## 2021-05-29 DIAGNOSIS — K439 Ventral hernia without obstruction or gangrene: Secondary | ICD-10-CM | POA: Diagnosis present

## 2021-05-29 DIAGNOSIS — E669 Obesity, unspecified: Secondary | ICD-10-CM | POA: Diagnosis present

## 2021-05-29 LAB — CBC
HCT: 42.2 % (ref 36.0–46.0)
Hemoglobin: 13.7 g/dL (ref 12.0–15.0)
MCH: 31.9 pg (ref 26.0–34.0)
MCHC: 32.5 g/dL (ref 30.0–36.0)
MCV: 98.4 fL (ref 80.0–100.0)
Platelets: 312 10*3/uL (ref 150–400)
RBC: 4.29 MIL/uL (ref 3.87–5.11)
RDW: 15.7 % — ABNORMAL HIGH (ref 11.5–15.5)
WBC: 7.9 10*3/uL (ref 4.0–10.5)
nRBC: 0 % (ref 0.0–0.2)

## 2021-05-29 LAB — GLUCOSE, CAPILLARY
Glucose-Capillary: 140 mg/dL — ABNORMAL HIGH (ref 70–99)
Glucose-Capillary: 123 mg/dL — ABNORMAL HIGH (ref 70–99)
Glucose-Capillary: 115 mg/dL — ABNORMAL HIGH (ref 70–99)
Glucose-Capillary: 114 mg/dL — ABNORMAL HIGH (ref 70–99)
Glucose-Capillary: 104 mg/dL — ABNORMAL HIGH (ref 70–99)
Glucose-Capillary: 93 mg/dL (ref 70–99)

## 2021-05-29 LAB — MAGNESIUM: Magnesium: 1.9 mg/dL (ref 1.7–2.4)

## 2021-05-29 LAB — URINALYSIS, ROUTINE W REFLEX MICROSCOPIC
Bilirubin Urine: NEGATIVE
Glucose, UA: 50 mg/dL — AB
Hgb urine dipstick: NEGATIVE
Ketones, ur: 5 mg/dL — AB
Leukocytes,Ua: NEGATIVE
Nitrite: NEGATIVE
Protein, ur: 100 mg/dL — AB
Specific Gravity, Urine: >1.046 — ABNORMAL HIGH (ref 1.005–1.030)
pH: 6 (ref 5.0–8.0)

## 2021-05-29 LAB — BASIC METABOLIC PANEL
Anion gap: 8 (ref 5–15)
BUN: 20 mg/dL (ref 8–23)
CO2: 27 mmol/L (ref 22–32)
Calcium: 9.2 mg/dL (ref 8.9–10.3)
Chloride: 106 mmol/L (ref 98–111)
Creatinine, Ser: 1.28 mg/dL — ABNORMAL HIGH (ref 0.44–1.00)
GFR, Estimated: 45 mL/min — ABNORMAL LOW (ref >60)
Glucose, Bld: 150 mg/dL — ABNORMAL HIGH (ref 70–99)
Potassium: 3.5 mmol/L (ref 3.5–5.1)
Sodium: 141 mmol/L (ref 135–145)

## 2021-05-29 LAB — LACTIC ACID, PLASMA
Lactic Acid, Venous: 2.5 mmol/L (ref 0.5–1.9)
Lactic Acid, Venous: 1.8 mmol/L (ref 0.5–1.9)

## 2021-05-29 LAB — RESP PANEL BY RT-PCR (FLU A&B, COVID) ARPGX2
Influenza A by PCR: NEGATIVE
Influenza B by PCR: NEGATIVE
SARS Coronavirus 2 by RT PCR: NEGATIVE

## 2021-05-29 MED ORDER — LABETALOL HCL 5 MG/ML IV SOLN: 10.0000 mg | INTRAVENOUS | Status: DC | PRN

## 2021-05-29 MED ORDER — PHENOL 1.4 % MT LIQD
1.0000 | OROMUCOSAL | Status: DC | PRN
  Administered 2021-05-29: 1 via OROMUCOSAL
  Filled 2021-05-29: qty 177

## 2021-05-29 MED ORDER — SODIUM CHLORIDE 0.9 % IV BOLUS
500.0000 mL | Freq: Once | INTRAVENOUS | Status: AC
  Administered 2021-05-29: 500 mL via INTRAVENOUS

## 2021-05-29 MED ORDER — ACETAMINOPHEN 325 MG PO TABS
650.0000 mg | ORAL_TABLET | Freq: Four times a day (QID) | ORAL | Status: DC | PRN
  Administered 2021-06-02: 650 mg
  Filled 2021-05-29: qty 2

## 2021-05-29 MED ORDER — POTASSIUM CHLORIDE IN NACL 20-0.9 MEQ/L-% IV SOLN
INTRAVENOUS | Status: AC
  Filled 2021-05-29 (×2): qty 1000

## 2021-05-29 MED ORDER — ONDANSETRON HCL 4 MG/2ML IJ SOLN
4.0000 mg | Freq: Four times a day (QID) | INTRAMUSCULAR | Status: DC | PRN
  Administered 2021-05-29 – 2021-05-30 (×3): 4 mg via INTRAVENOUS
  Filled 2021-05-29 (×3): qty 2

## 2021-05-29 MED ORDER — INSULIN ASPART 100 UNIT/ML IJ SOLN
0.0000 [IU] | INTRAMUSCULAR | Status: DC
  Administered 2021-05-29 (×2): 1 [IU] via SUBCUTANEOUS
  Administered 2021-05-30: 2 [IU] via SUBCUTANEOUS
  Administered 2021-05-30 – 2021-06-01 (×2): 1 [IU] via SUBCUTANEOUS

## 2021-05-29 MED ORDER — ENOXAPARIN SODIUM 40 MG/0.4ML IJ SOSY
40.0000 mg | PREFILLED_SYRINGE | INTRAMUSCULAR | Status: DC
  Administered 2021-05-29 – 2021-06-02 (×5): 40 mg via SUBCUTANEOUS
  Filled 2021-05-29 (×5): qty 0.4

## 2021-05-29 MED ORDER — DIATRIZOATE MEGLUMINE & SODIUM 66-10 % PO SOLN
90.0000 mL | Freq: Once | ORAL | Status: AC
  Administered 2021-05-29: 90 mL via NASOGASTRIC
  Filled 2021-05-29: qty 90

## 2021-05-29 MED ORDER — FENTANYL CITRATE PF 50 MCG/ML IJ SOSY
25.0000 ug | PREFILLED_SYRINGE | INTRAMUSCULAR | Status: DC | PRN
  Administered 2021-05-29 – 2021-05-30 (×4): 50 ug via INTRAVENOUS
  Filled 2021-05-29 (×4): qty 1

## 2021-05-29 MED ORDER — ACETAMINOPHEN 650 MG RE SUPP: 650.0000 mg | Freq: Four times a day (QID) | RECTAL | Status: DC | PRN

## 2021-05-29 MED ORDER — ONDANSETRON HCL 4 MG PO TABS: 4.0000 mg | ORAL_TABLET | Freq: Four times a day (QID) | ORAL | Status: DC | PRN

## 2021-05-29 MED ORDER — LIDOCAINE HCL URETHRAL/MUCOSAL 2 % EX GEL
1.0000 "application " | Freq: Once | CUTANEOUS | Status: AC
  Administered 2021-05-29: 1
  Filled 2021-05-29: qty 11

## 2021-05-29 MED ORDER — MENTHOL 3 MG MT LOZG
1.0000 | LOZENGE | OROMUCOSAL | Status: DC | PRN
  Filled 2021-05-29: qty 9

## 2021-05-29 NOTE — Hospital Course (Addendum)
Christina [name] is Christina [name] pleasant 71 y.o. female with medical history significant for hypertension, type 2 diabetes mellitus, and CKD stage IIIa, now presenting to the emergency department for evaluation of nominal pain with nausea, vomiting, and diarrhea.  Patient reports that she had been in her usual state of health until 05/27/2021 when she developed generalized abdominal pain, 2 episodes of nonbloody diarrhea, and severe nausea with too numerous to count episodes of vomiting.  She is not aware of any sick contacts.  She had similar symptoms in September 2020 that required hospitalization and resolved with conservative management.  She reports history of colon cancer that was treated with resection of 12 inches of colon approximately 15 to 20 years ago.  She reports [name] fever to 101.1 F yesterday.  She had CT findings with enteritis with SBO.  She's improved with conservative management.  Plan for outpatient follow up.   See below for additional details

## 2021-05-29 NOTE — Assessment & Plan Note (Signed)
Will follow, appears close to baseline

## 2021-05-29 NOTE — Assessment & Plan Note (Addendum)
Resume home meds ?

## 2021-05-29 NOTE — ED Notes (Signed)
Called [facility] to transport patient to [facility] [phone]

## 2021-05-29 NOTE — H&P (Signed)
History and Physical    Christina [name] TWK:[id number] DOB: 11-13-1950 DOA: 05/28/2021  PCP: [name], MD   Patient coming from: Home   Chief Complaint: N/V/D, abdominal pain   HPI: Christina [name] is a pleasant 71 y.o. female with medical history significant for hypertension, type 2 diabetes mellitus, and CKD stage IIIa, now presenting to the emergency department for evaluation of nominal pain with nausea, vomiting, and diarrhea.  Patient reports that she had been in her usual state of health until 05/27/2021 when she developed generalized abdominal pain, 2 episodes of nonbloody diarrhea, and severe nausea with too numerous to count episodes of vomiting.  She is not aware of any sick contacts.  She had similar symptoms in September 2020 that required hospitalization and resolved with conservative management.  She reports history of colon cancer that was treated with resection of 12 inches of colon approximately 15 to 20 years ago.  She reports a fever to 101.1 F yesterday.  [facility] ED Course: Upon arrival to the ED, patient is found to be afebrile, saturating well on room air, and with stable blood pressure.  EKG features sinus rhythm and chest x-ray negative for acute cardiopulmonary disease.  Chemistry panel notable for potassium 3.3, glucose 205, and creatinine 1.22.  CBC with mild polycythemia.  Lactic acid was 2.2.  COVID and influenza PCR negative.  CT of the abdomen and pelvis demonstrates thick-walled, dilated, and fluid-filled loops of small bowel concerning for infectious or inflammatory enteritis with a degree of SBO.  NG tube was placed in the ED, 1 L of saline and Zofran were administered, and the patient was transferred to [facility] for admission.  Review of Systems:  All other systems reviewed and apart from HPI, are negative.  Past Medical History:  Diagnosis Date   Arthritis    OA RIGHT KNEE AND PAIN   Cancer ([place]) 05/04/2009   HX OF COLON CANCER; S/P SURGERY AND  DID NOT HAVE TO HAVE CHEMO OR RADIATION   Diabetes mellitus without complication (HCC)    Hypertension     Past Surgical History:  Procedure Laterality Date   BREAST SURGERY     RIGHT AND LEFT BREAST TUMORS REMOVED - BENIGN   C-SECTIONS X 2  1981   1991   COLON SURGERY  2015   CYST EXCISION Left 2020   hand and ganglian cyst and 2021 Lt arm   FATTY TUMOR REMOVED FROM RIGHT HEEL     age 27   TONSILLECTOMY     AT AGE OF 12   TOTAL KNEE ARTHROPLASTY Right 08/02/2013   Procedure: RIGHT TOTAL KNEE ARTHROPLASTY;  Surgeon: Tobi Bastos, MD;  Location: [facility];  Service: Orthopedics;  Laterality: Right;   TOTAL KNEE ARTHROPLASTY Left 03/31/2021   Procedure: TOTAL KNEE ARTHROPLASTY;  Surgeon: Gaynelle Arabian, MD;  Location: [facility];  Service: Orthopedics;  Laterality: Left;    Social History:   reports that she has never smoked. She has never used smokeless tobacco. She reports that she does not drink alcohol and does not use drugs.  Allergies  Allergen Reactions   Atorvastatin     Other reaction(s): Cramps (ALLERGY/intolerance), Other Other reaction(s): muscle pain   Penicillins Dermatitis and Rash    nails peels Tolerated Cephalosporin Date: 04/01/21.   Other reaction(s): rash/ skin peel/ and nails come off   Rosuvastatin     Other reaction(s): Cramps & muscle pain Other reaction(s): muscle pain   Sulfa Antibiotics Nausea And Vomiting  Other reaction(s): Muscle Cramps   Cholestyramine Diarrhea    Other reaction(s): diarrhea   Gemfibrozil Diarrhea and Nausea And Vomiting    Other reaction(s): vomiting and diarrhea   Pravastatin     Other reaction(s): muscle pain    Family History  Problem Relation Age of Onset   Hypertension Mother    Heart attack Mother      Prior to Admission medications   Medication Sig Start Date End Date Taking? Authorizing Provider  ACCU-CHEK AVIVA PLUS test strip  01/18/19   [provider]  Accu-Chek Softclix Lancets lancets   01/17/19   [provider]  Alcohol Swabs (B-D SINGLE USE SWABS REGULAR) PADS USE TO TEST BLOOD SUGAR ONCE A DAY    [provider]  amLODipine-benazepril (LOTREL) 10-40 MG capsule Take 1 capsule by mouth daily. 12/26/18   [provider]  Blood Glucose Calibration (ACCU-CHEK GUIDE CONTROL) LIQD See admin instructions. 09/03/20   [provider]  Blood Glucose Monitoring Suppl (ACCU-CHEK GUIDE) w/Device KIT See admin instructions.    [provider]  gabapentin (NEURONTIN) 300 MG capsule Take a 300 mg capsule three times a day for two weeks following surgery.Then take a 300 mg capsule two times a day for two weeks. Then take a 300 mg capsule once a day for two weeks. Then discontinue. 04/01/21   Edmisten, Ok Anis, PA  hydrocortisone (ANUSOL-HC) 2.5 % rectal cream Apply to hemorrhoids daily. 04/10/21   Molpus, John, MD  Lidocaine, Anorectal, (RECTICARE) 5 % CREA Apply to hemorrhoids as needed for pain. 04/10/21   Molpus, Jenny Reichmann, MD  metFORMIN (GLUCOPHAGE-XR) 500 MG 24 hr tablet Take 1,000 mg by mouth daily with breakfast. 04/12/19   [provider]  methocarbamol (ROBAXIN) 500 MG tablet Take 1 tablet (500 mg total) by mouth every 6 (six) hours as needed for muscle spasms. 04/01/21   Edmisten, Kristie L, PA  oxyCODONE (OXY IR/ROXICODONE) 5 MG immediate release tablet Take 1-2 tablets (5-10 mg total) by mouth every 6 (six) hours as needed for severe pain. Not to exceed 6 tablets a day. 04/01/21   Edmisten, Ok Anis, PA  traMADol (ULTRAM) 50 MG tablet Take 1-2 tablets (50-100 mg total) by mouth every 6 (six) hours as needed for moderate pain. 04/01/21   Edmisten, Kristie L, PA  Vibegron 75 MG TABS Take 75 mg by mouth daily.    [provider]    Physical Exam: Vitals:   05/29/21 0115 05/29/21 0145 05/29/21 0200 05/29/21 0256  BP:   (!) 144/68 139/71  Pulse: 84 87 86 84  Resp: _0 Temp:    98.7 F (37.1 C)  TempSrc:    Oral  SpO2:  95% 95% 95% 98%  Weight:      Height:        Constitutional: NAD, calm  Eyes: PERTLA, lids and conjunctivae normal ENMT: Mucous membranes are moist. Posterior pharynx clear of any exudate or lesions.   Neck: supple, no masses  Respiratory:  no wheezing, no crackles. No accessory muscle use.  Cardiovascular: S1 & S2 heard, regular rate and rhythm. No significant JVD. Abdomen: soft, no rebound pain or guarding. Bowel sounds hypoactive.  Musculoskeletal: no clubbing / cyanosis. No joint deformity upper and lower extremities.   Skin: no significant rashes, lesions, ulcers. Warm, dry, well-perfused. Neurologic: CN 2-12 grossly intact. Moving all extremities. Alert and oriented.  Psychiatric: Calm. Cooperative.    Labs and Imaging on Admission: I have personally reviewed following  labs and imaging studies  CBC: Recent Labs  Lab 05/28/21 2150  WBC 8.0  HGB 15.2*  HCT 46.4*  MCV 95.3  PLT 161   Basic Metabolic Panel: Recent Labs  Lab 05/28/21 2150  NA 140  K 3.3*  CL 101  CO2 23  GLUCOSE 205*  BUN 18  CREATININE 1.22*  CALCIUM 10.1   GFR: Estimated Creatinine Clearance: 44.3 mL/min (A) (by C-G formula based on SCr of 1.22 mg/dL (H)). Liver Function Tests: Recent Labs  Lab 05/28/21 2150  AST 14*  ALT 11  ALKPHOS 72  BILITOT 0.6  PROT 8.0  ALBUMIN 4.3   Recent Labs  Lab 05/28/21 2150  LIPASE <10*   No results for input(s): AMMONIA in the last 168 hours. Coagulation Profile: No results for input(s): INR, PROTIME in the last 168 hours. Cardiac Enzymes: No results for input(s): CKTOTAL, CKMB, CKMBINDEX, TROPONINI in the last 168 hours. BNP (last 3 results) No results for input(s): PROBNP in the last 8760 hours. HbA1C: No results for input(s): HGBA1C in the last 72 hours. CBG: No results for input(s): GLUCAP in the last 168 hours. Lipid Profile: No results for input(s): CHOL, HDL, LDLCALC, TRIG, CHOLHDL, LDLDIRECT in the last 72 hours. Thyroid Function  Tests: No results for input(s): TSH, T4TOTAL, FREET4, T3FREE, THYROIDAB in the last 72 hours. Anemia Panel: No results for input(s): VITAMINB12, FOLATE, FERRITIN, TIBC, IRON, RETICCTPCT in the last 72 hours. Urine analysis:    Component Value Date/Time   COLORURINE YELLOW 01/12/2019 2000   APPEARANCEUR CLEAR 01/12/2019 2000   LABSPEC 1.021 01/12/2019 2000   PHURINE 7.0 01/12/2019 2000   GLUCOSEU NEGATIVE 01/12/2019 2000   HGBUR NEGATIVE 01/12/2019 2000   BILIRUBINUR NEGATIVE 01/12/2019 2000   KETONESUR NEGATIVE 01/12/2019 2000   PROTEINUR NEGATIVE 01/12/2019 2000   UROBILINOGEN 0.2 07/24/2013 1424   NITRITE NEGATIVE 01/12/2019 2000   LEUKOCYTESUR NEGATIVE 01/12/2019 2000   Sepsis Labs: _0 (procalcitonin:4,lacticidven:4) ) Recent Results (from the past 240 hour(s))  Resp Panel by RT-PCR (Flu A&B, Covid) Nasopharyngeal Swab     Status: None   Collection Time: 05/28/21 11:49 PM   Specimen: Nasopharyngeal Swab; Nasopharyngeal(NP) swabs in vial transport medium  Result Value Ref Range Status   SARS Coronavirus 2 by RT PCR NEGATIVE NEGATIVE Final    Comment: (NOTE) SARS-CoV-2 target nucleic acids are NOT DETECTED.  The SARS-CoV-2 RNA is generally detectable in upper respiratory specimens during the acute phase of infection. The lowest concentration of SARS-CoV-2 viral copies this assay can detect is 138 copies/mL. A negative result does not preclude SARS-Cov-2 infection and should not be used as the sole basis for treatment or other patient management decisions. A negative result may occur with  improper specimen collection/handling, submission of specimen other than nasopharyngeal swab, presence of viral mutation(s) within the areas targeted by this assay, and inadequate number of viral copies(<138 copies/mL). A negative result must be combined with clinical observations, patient history, and epidemiological information. The expected result is Negative.  Fact Sheet for  Patients:  EntrepreneurPulse.com.au  Fact Sheet for Healthcare Providers:  IncredibleEmployment.be  This test is no t yet approved or cleared by the [place] FDA and  has been authorized for detection and/or diagnosis of SARS-CoV-2 by FDA under an Emergency Use Authorization (EUA). This EUA will remain  in effect (meaning this test can be used) for the duration of the COVID-19 declaration under Section 564(b)(1) of the Act, 21 U.S.C.section 360bbb-3(b)(1), unless the authorization is terminated  or revoked sooner.  Influenza A by PCR NEGATIVE NEGATIVE Final   Influenza B by PCR NEGATIVE NEGATIVE Final    Comment: (NOTE) The Xpert Xpress SARS-CoV-2/FLU/RSV plus assay is intended as an aid in the diagnosis of influenza from Nasopharyngeal swab specimens and should not be used as a sole basis for treatment. Nasal washings and aspirates are unacceptable for Xpert Xpress SARS-CoV-2/FLU/RSV testing.  Fact Sheet for Patients: EntrepreneurPulse.com.au  Fact Sheet for Healthcare Providers: IncredibleEmployment.be  This test is not yet approved or cleared by the [place] FDA and has been authorized for detection and/or diagnosis of SARS-CoV-2 by FDA under an Emergency Use Authorization (EUA). This EUA will remain in effect (meaning this test can be used) for the duration of the COVID-19 declaration under Section 564(b)(1) of the Act, 21 U.S.C. section 360bbb-3(b)(1), unless the authorization is terminated or revoked.  Performed at [facility], [address], [place], [place] [address]      Radiological Exams on Admission: DG Chest 2 View  Result Date: 05/28/2021 CLINICAL DATA:  Infection. EXAM: CHEST - 2 VIEW COMPARISON:  Chest x-ray 07/24/2013. FINDINGS: The heart size and mediastinal contours are within normal limits. Both lungs are clear. The visualized skeletal  structures are unremarkable. IMPRESSION: No active cardiopulmonary disease. Electronically Signed   By: Ronney Asters M.D.   On: 05/28/2021 22:24   DG Abdomen 1 View  Result Date: 05/29/2021 CLINICAL DATA:  Tube placement EXAM: ABDOMEN - 1 VIEW COMPARISON:  None. FINDINGS: NG tube is in place with the tip in the stomach. Paucity of gas in the abdomen. IMPRESSION: NG tube in the stomach. Electronically Signed   By: Rolm Baptise M.D.   On: 05/29/2021 01:11   CT ABDOMEN PELVIS W CONTRAST  Result Date: 05/28/2021 CLINICAL DATA:  Upper abdominal pain EXAM: CT ABDOMEN AND PELVIS WITH CONTRAST TECHNIQUE: Multidetector CT imaging of the abdomen and pelvis was performed using the standard protocol following bolus administration of intravenous contrast. RADIATION DOSE REDUCTION: This exam was performed according to the departmental dose-optimization program which includes automated exposure control, adjustment of the mA and/or kV according to patient size and/or use of iterative reconstruction technique. CONTRAST:  154m OMNIPAQUE IOHEXOL 300 MG/ML  SOLN COMPARISON:  01/12/2019 FINDINGS: Lower chest: No acute abnormality. Small to moderate-sized hiatal hernia. Hepatobiliary: No focal hepatic abnormality. Gallbladder unremarkable. Pancreas: No focal abnormality or ductal dilatation. Spleen: No focal abnormality.  Normal size. Adrenals/Urinary Tract: Adrenal glands unremarkable. 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 Decreasing size of right upper pole cyst, now measuring 1.6 cm compared to 3.7 cm previously. No hydronephrosis. Urinary bladder unremarkable. Stomach/Bowel: Postoperative changes in the right colon. There are multiple thick walled small bowel loops seen in the central and left abdomen. These loops are also mildly dilated and fluid-filled. Distal small bowel loops are decompressed and unremarkable. Vascular/Lymphatic: Aortic atherosclerosis. No evidence of aneurysm  or adenopathy. Reproductive: Uterus and adnexa unremarkable.  No mass. Other: There is a midline ventral hernia noted containing fat and a small bowel loop. This does not appear to be the cause of the dilated small bowel. There is fluid also within the hernia sac. Fluid adjacent to the dilated, thick walled left abdominal small bowel loops as well as adjacent to the liver. No free air. Musculoskeletal: No acute bony abnormality. IMPRESSION: Thick walled, dilated small bowel loops in the mid to left abdomen. Proximal to these thickened small bowel loops, jejunal small bowel loops are mildly dilated and fluid-filled. Findings  are concerning for enteritis with a degree of small-bowel obstruction. This most likely reflects infectious or inflammatory enteritis. Mesenteric vessels are patent making ischemic enteritis less likely. Anterior lower abdominal wall ventral hernia containing fat, fluid and a small bowel loop. This does not appear to be the cause of the small bowel obstruction. Free fluid adjacent to the dilated thick-walled small bowel loops as well as adjacent to the liver. 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 Moderate hiatal hernia. Electronically Signed   By: Rolm Baptise M.D.   On: 05/28/2021 23:38    EKG: Independently reviewed. Sinus rhythm, QTc 438 ms.   Assessment/Plan  1. Acute gastroenteritis; SBO  - Presents with one day of fever and N/V/D and found to have thick-walled, fluid-filled, and dilated small bowel loops concerning for enteritis with a degree of SBO  - Mesenteric vessels appear patent on CT and hernia does not appear to be the cause of obstruction  - NGT placed in ED with improvement in sxs  - Continue NGT decompression and IVF hydration, continue enteric precautions, repeat labs and exam in am    2. Type II DM  - A1c was 6.1% in Nov 2022  - Check CBGs and use low-intensity SSI if needed    3. Hypertension  - Treat as-needed only for now    4. CKD IIIa  -  SCr is 1.22 on admission, similar to priors  - Renally-dose medications, monitor   5. Hypokalemia  - Add KCl to IVF, monitor    DVT prophylaxis: Lovenox  Code Status: Full, discussed with patient on admission  Level of Care: Level of care: Med-Surg Family Communication: none present  Disposition Plan:  Patient is from: home  Anticipated d/c is to: home  Anticipated d/c date is:1/28 or 06/01/21 Patient currently: pending return of bowel function, tolerance of adequate oral intake  Consults called: none  Admission status: Inpatient     Vianne Bulls, MD [facility]  05/29/2021, 3:34 AM

## 2021-05-29 NOTE — Assessment & Plan Note (Addendum)
CT concerning for enteritis with degree of SBO (infectious vs inflammatory enteritis) NG tube out 1/27, no nausea/vomiting - monitor KUB with dilation of small bowel loops, possible interval worsening  Currently afebrile without WBC count, will hold off on abx Appreciate surgery assistance -> she's tolerated Christina [name] soft diet today, plan is for discharge today.  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

## 2021-05-29 NOTE — Progress Notes (Signed)
PROGRESS NOTE    Christina [name]  ION:[id number] DOB: 10-26-1950 DOA: 05/28/2021 PCP: [name], MD  Chief Complaint  Patient presents with   Abdominal Pain    Brief Narrative:   Christina [name] is Christina [name] pleasant 71 y.o. female with medical history significant for hypertension, type 2 diabetes mellitus, and CKD stage IIIa, now presenting to the emergency department for evaluation of nominal pain with nausea, vomiting, and diarrhea.  Patient reports that she had been in her usual state of health until 05/27/2021 when she developed generalized abdominal pain, 2 episodes of nonbloody diarrhea, and severe nausea with too numerous to count episodes of vomiting.  She is not aware of any sick contacts.  She had similar symptoms in September 2020 that required hospitalization and resolved with conservative management.  She reports history of colon cancer that was treated with resection of 12 inches of colon approximately 15 to 20 years ago.  She reports Tu [place]le fever to 101.1 F yesterday.   [facility] ED Course: Upon arrival to the ED, patient is found to be afebrile, saturating well on room air, and with stable blood pressure.  EKG features sinus rhythm and chest x-ray negative for acute cardiopulmonary disease.  Chemistry panel notable for potassium 3.3, glucose 205, and creatinine 1.22.  CBC with mild polycythemia.  Lactic acid was 2.2.  COVID and influenza PCR negative.  CT of the abdomen and pelvis demonstrates thick-walled, dilated, and fluid-filled loops of small bowel concerning for infectious or inflammatory enteritis with Stavros Cail degree of SBO.  NG tube was placed in the ED, 1 L of saline and Zofran were administered, and the patient was transferred to [facility] for admission.    Assessment & Plan:   Principal Problem:   SBO (small bowel obstruction) (HCC) Active Problems:   Acute gastroenteritis   Type 2 diabetes mellitus with stage 3 chronic kidney disease (HCC)   Hypertension   Stage 3a chronic  kidney disease (CKD) (HCC)   Hypokalemia   * SBO (small bowel obstruction) ([place])- (present on admission) CT concerning for enteritis with degree of SBO (infectious vs inflammatory enteritis) NGT to LIS Currently afebrile without WBC count, will hold off on abx Appreciate surgery assistance Will send inflammatory markers Fecal lactoferrin, if appropriate GI apth panel and c diff Recurrent since 2020, GI outpatient follow up is reasonable  Type 2 diabetes mellitus with stage 3 chronic kidney disease ([place])- (present on admission) A1c 6.1 in Nov 2022 Continue SSI  Hypertension- (present on admission) Continue to monitor Prn labetalol ordered, currently NPO  Stage 3a chronic kidney disease (CKD) ([place])- (present on admission) Will follow, appears close to baseline  Hypokalemia Follow, replace prn   DVT prophylaxis: lovenox Code Status: full Family Communication: none Disposition:   Status is: Inpatient  Remains inpatient appropriate because: SBO, pending resolution       Consultants:  surgery  Procedures:  none  Antimicrobials:  Anti-infectives (From admission, onward)    None       Subjective: No flatus, no BM  Objective: Vitals:   05/29/21 0435 05/29/21 0544 05/29/21 1028 05/29/21 1428  BP:  (!) 158/73 (!) 163/72 (!) 159/92  Pulse:  81 82 85  Resp:  16 20 20   Temp:  98.5 F (36.9 C) 98.4 F (36.9 C) 99 F (37.2 C)  TempSrc:  Oral Oral Oral  SpO2:  96% 95% 97%  Weight: 90.6 kg     Height:        Intake/Output Summary (Last  24 hours) at 05/29/2021 1534 Last data filed at 05/29/2021 0630 Gross per 24 hour  Intake 1603.9 ml  Output 150 ml  Net 1453.9 ml   Filed Weights   05/28/21 2149 05/29/21 0435  Weight: 90.7 kg 90.6 kg    Examination:  General exam: Appears calm and comfortable  Respiratory system: unlabored Cardiovascular system: RRR Gastrointestinal system: Abdomen is mildly distended, mildly TTP - NGT in place Central nervous  system: Alert and oriented. No focal neurological deficits. Extremities: no LEE Skin: No rashes, lesions or ulcers Psychiatry: Judgement and insight appear normal. Mood & affect appropriate.     Data Reviewed: I have personally reviewed following labs and imaging studies  CBC: Recent Labs  Lab 05/28/21 2150 05/29/21 0512  WBC 8.0 7.9  HGB 15.2* 13.7  HCT 46.4* 42.2  MCV 95.3 98.4  PLT 364 409    Basic Metabolic Panel: Recent Labs  Lab 05/28/21 2150 05/29/21 0512  NA 140 141  K 3.3* 3.5  CL 101 106  CO2 23 27  GLUCOSE 205* 150*  BUN 18 20  CREATININE 1.22* 1.28*  CALCIUM 10.1 9.2  MG  --  1.9    GFR: Estimated Creatinine Clearance: 42.2 mL/min (Laron Angelini) (by C-G formula based on SCr of 1.28 mg/dL (H)).  Liver Function Tests: Recent Labs  Lab 05/28/21 2150  AST 14*  ALT 11  ALKPHOS 72  BILITOT 0.6  PROT 8.0  ALBUMIN 4.3    CBG: Recent Labs  Lab 05/29/21 0418 05/29/21 0808 05/29/21 1218 05/29/21 1530  GLUCAP 140* 123* 115* 114*     Recent Results (from the past 240 hour(s))  Resp Panel by RT-PCR (Flu Cristal Howatt&B, Covid) Nasopharyngeal Swab     Status: None   Collection Time: 05/28/21 11:49 PM   Specimen: Nasopharyngeal Swab; Nasopharyngeal(NP) swabs in vial transport medium  Result Value Ref Range Status   SARS Coronavirus 2 by RT PCR NEGATIVE NEGATIVE Final    Comment: (NOTE) SARS-CoV-2 target nucleic acids are NOT DETECTED.  The SARS-CoV-2 RNA is generally detectable in upper respiratory specimens during the acute phase of infection. The lowest concentration of SARS-CoV-2 viral copies this assay can detect is 138 copies/mL. Seanpaul Preece negative result does not preclude SARS-Cov-2 infection and should not be used as the sole basis for treatment or other patient management decisions. Jayce Kainz negative result may occur with  improper specimen collection/handling, submission of specimen other than nasopharyngeal swab, presence of viral mutation(s) within the areas targeted  by this assay, and inadequate number of viral copies(<138 copies/mL). Siraj Dermody negative result must be combined with clinical observations, patient history, and epidemiological information. The expected result is Negative.  Fact Sheet for Patients:  EntrepreneurPulse.com.au  Fact Sheet for Healthcare Providers:  IncredibleEmployment.be  This test is no t yet approved or cleared by the [place] FDA and  has been authorized for detection and/or diagnosis of SARS-CoV-2 by FDA under an Emergency Use Authorization (EUA). This EUA will remain  in effect (meaning this test can be used) for the duration of the COVID-19 declaration under Section 564(b)(1) of the Act, 21 U.S.C.section 360bbb-3(b)(1), unless the authorization is terminated  or revoked sooner.       Influenza Tanajah Boulter by PCR NEGATIVE NEGATIVE Final   Influenza B by PCR NEGATIVE NEGATIVE Final    Comment: (NOTE) The Xpert Xpress SARS-CoV-2/FLU/RSV plus assay is intended as an aid in the diagnosis of influenza from Nasopharyngeal swab specimens and should not be used as Samuella Rasool sole basis for treatment. Nasal  washings and aspirates are unacceptable for Xpert Xpress SARS-CoV-2/FLU/RSV testing.  Fact Sheet for Patients: EntrepreneurPulse.com.au  Fact Sheet for Healthcare Providers: IncredibleEmployment.be  This test is not yet approved or cleared by the [place] FDA and has been authorized for detection and/or diagnosis of SARS-CoV-2 by FDA under an Emergency Use Authorization (EUA). This EUA will remain in effect (meaning this test can be used) for the duration of the COVID-19 declaration under Section 564(b)(1) of the Act, 21 U.S.C. section 360bbb-3(b)(1), unless the authorization is terminated or revoked.  Performed at [facility], [address], [place], [place] [address]          Radiology Studies: DG Chest 2 View  Result  Date: 05/28/2021 CLINICAL DATA:  Infection. EXAM: CHEST - 2 VIEW COMPARISON:  Chest x-ray 07/24/2013. FINDINGS: The heart size and mediastinal contours are within normal limits. Both lungs are clear. The visualized skeletal structures are unremarkable. IMPRESSION: No active cardiopulmonary disease. Electronically Signed   By: Ronney Asters M.D.   On: 05/28/2021 22:24   DG Abdomen 1 View  Result Date: 05/29/2021 CLINICAL DATA:  Tube placement EXAM: ABDOMEN - 1 VIEW COMPARISON:  None. FINDINGS: NG tube is in place with the tip in the stomach. Paucity of gas in the abdomen. IMPRESSION: NG tube in the stomach. Electronically Signed   By: Rolm Baptise M.D.   On: 05/29/2021 01:11   CT ABDOMEN PELVIS W CONTRAST  Result Date: 05/28/2021 CLINICAL DATA:  Upper abdominal pain EXAM: CT ABDOMEN AND PELVIS WITH CONTRAST TECHNIQUE: Multidetector CT imaging of the abdomen and pelvis was performed using the standard protocol following bolus administration of intravenous contrast. RADIATION DOSE REDUCTION: This exam was performed according to the departmental dose-optimization program which includes automated exposure control, adjustment of the mA and/or kV according to patient size and/or use of iterative reconstruction technique. CONTRAST:  173mL OMNIPAQUE IOHEXOL 300 MG/ML  SOLN COMPARISON:  01/12/2019 FINDINGS: Lower chest: No acute abnormality. Small to moderate-sized hiatal hernia. Hepatobiliary: No focal hepatic abnormality. Gallbladder unremarkable. Pancreas: No focal abnormality or ductal dilatation. Spleen: No focal abnormality.  Normal size. Adrenals/Urinary Tract: Adrenal glands unremarkable. 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 Decreasing size of right upper pole cyst, now measuring 1.6 cm compared to 3.7 cm previously. No hydronephrosis. Urinary bladder unremarkable. Stomach/Bowel: Postoperative changes in the right colon. There are multiple thick walled small bowel  loops seen in the central and left abdomen. These loops are also mildly dilated and fluid-filled. Distal small bowel loops are decompressed and unremarkable. Vascular/Lymphatic: Aortic atherosclerosis. No evidence of aneurysm or adenopathy. Reproductive: Uterus and adnexa unremarkable.  No mass. Other: There is Harrie Cazarez midline ventral hernia noted containing fat and Aelyn Stanaland small bowel loop. This does not appear to be the cause of the dilated small bowel. There is fluid also within the hernia sac. Fluid adjacent to the dilated, thick walled left abdominal small bowel loops as well as adjacent to the liver. No free air. Musculoskeletal: No acute bony abnormality. IMPRESSION: Thick walled, dilated small bowel loops in the mid to left abdomen. Proximal to these thickened small bowel loops, jejunal small bowel loops are mildly dilated and fluid-filled. Findings are concerning for enteritis with Joclynn Lumb degree of small-bowel obstruction. This most likely reflects infectious or inflammatory enteritis. Mesenteric vessels are patent making ischemic enteritis less likely. Anterior lower abdominal wall ventral hernia containing fat, fluid and Lottie Sigman small bowel loop. This does not appear to be the cause of the  small bowel obstruction. Free fluid adjacent to the dilated thick-walled small bowel loops as well as adjacent to the liver. 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 Moderate hiatal hernia. Electronically Signed   By: Rolm Baptise M.D.   On: 05/28/2021 23:38        Scheduled Meds:  enoxaparin (LOVENOX) injection  40 mg Subcutaneous Q24H   insulin aspart  0-9 Units Subcutaneous Q4H   Continuous Infusions:  0.9 % NaCl with KCl 20 mEq / L 85 mL/hr at 05/29/21 1515     LOS: 0 days    Time spent: over 30 min    Fayrene Helper, MD [facility]   To contact the attending provider between 7A-7P or the covering provider during after hours 7P-7A, please log into the web site [email or web address] and access using  universal [place] password for that web site. If you do not have the password, please call the hospital operator.  05/29/2021, 3:34 PM

## 2021-05-29 NOTE — Assessment & Plan Note (Signed)
A1c 6.1 in Nov 2022 Continue SSI

## 2021-05-29 NOTE — Assessment & Plan Note (Addendum)
Replace and follow. ?

## 2021-05-29 NOTE — Consult Note (Signed)
Christina [name] 08-Oct-1950  [id number].    Requesting MD: Dr. Fayrene Helper Chief Complaint/Reason for Consult: SBO, ? gastroenteritis  HPI:  This is a 71 yo female with a history of colon cancer, s/p resection at [facility] in 2006 per notes (she didn't tell me about this), DM, and HTN who began having diarrhea on Tuesday. This has since stopped.  She then developed N/V on Wednesday.  She was having abdominal pain in her LUQ.  She denies any fevers at home.  She states she has never had anything like this, but in review of her chart, she had a similar episode with almost the exact same findings in 2020.  She presented to the ED where she was found to have a normal WBC and a CT scan that reveals thick walled dilated SB loops in the mid to left abdomen concerning for enteritis with a degree of SBO.  She does have a VH present with a loop of SB present, but this is dilated pre and post hernia and not felt to be the source of obstruction.  She has been admitted and an NGT has been placed.  We have been asked to see for further evaluation.  ROS: ROS: Please see HPI, otherwise all other systems reviewed and are negative.  Family History  Problem Relation Age of Onset   Hypertension Mother    Heart attack Mother     Past Medical History:  Diagnosis Date   Arthritis    OA RIGHT KNEE AND PAIN   Cancer ([place]) 05/04/2009   HX OF COLON CANCER; S/P SURGERY AND DID NOT HAVE TO HAVE CHEMO OR RADIATION   Diabetes mellitus without complication (HCC)    Hypertension     Past Surgical History:  Procedure Laterality Date   BREAST SURGERY     RIGHT AND LEFT BREAST TUMORS REMOVED - BENIGN   C-SECTIONS X 2  1981   1991   COLON SURGERY  2015   CYST EXCISION Left 2020   hand and ganglian cyst and 2021 Lt arm   FATTY TUMOR REMOVED FROM RIGHT HEEL     age 66   TONSILLECTOMY     AT AGE OF 12   TOTAL KNEE ARTHROPLASTY Right 08/02/2013   Procedure: RIGHT TOTAL KNEE ARTHROPLASTY;  Surgeon:  Tobi Bastos, MD;  Location: [facility];  Service: Orthopedics;  Laterality: Right;   TOTAL KNEE ARTHROPLASTY Left 03/31/2021   Procedure: TOTAL KNEE ARTHROPLASTY;  Surgeon: Gaynelle Arabian, MD;  Location: [facility];  Service: Orthopedics;  Laterality: Left;    Social History:  reports that she has never smoked. She has never used smokeless tobacco. She reports that she does not drink alcohol and does not use drugs.  Allergies:  Allergies  Allergen Reactions   Atorvastatin     Other reaction(s): Cramps (ALLERGY/intolerance), Other Other reaction(s): muscle pain   Penicillins Dermatitis and Rash    nails peels Tolerated Cephalosporin Date: 04/01/21.   Other reaction(s): rash/ skin peel/ and nails come off   Rosuvastatin     Other reaction(s): Cramps & muscle pain Other reaction(s): muscle pain   Sulfa Antibiotics Nausea And Vomiting    Other reaction(s): Muscle Cramps   Cholestyramine Diarrhea    Other reaction(s): diarrhea   Gemfibrozil Diarrhea and Nausea And Vomiting    Other reaction(s): vomiting and diarrhea   Pravastatin     Other reaction(s): muscle pain    Medications Prior to Admission  Medication Sig Dispense Refill  acetaminophen (TYLENOL) 500 MG tablet Take 1,000 mg by mouth every 6 (six) hours as needed for mild pain.     amLODipine-benazepril (LOTREL) 10-40 MG capsule Take 1 capsule by mouth daily.     APPLE CIDER VINEGAR PO Take 1 tablet by mouth daily. [place] Gummy     doxycycline (VIBRAMYCIN) 100 MG capsule Take 100 mg by mouth 2 (two) times daily.     Lidocaine, Anorectal, (RECTICARE) 5 % CREA Apply to hemorrhoids as needed for pain. (Patient taking differently: Place 1 application rectally daily as needed (pain). Apply to hemorrhoids as needed for pain.) 28 g 3   metFORMIN (GLUCOPHAGE-XR) 500 MG 24 hr tablet Take 1,000 mg by mouth daily with breakfast.     Misc Natural Products (IMMUNE FORMULA PO) Take 1 tablet by mouth daily.     Vibegron 75 MG TABS Take 75 mg  by mouth daily.     ACCU-CHEK AVIVA PLUS test strip      Accu-Chek Softclix Lancets lancets      Alcohol Swabs (B-D SINGLE USE SWABS REGULAR) PADS USE TO TEST BLOOD SUGAR ONCE A DAY     Blood Glucose Calibration (ACCU-CHEK GUIDE CONTROL) LIQD See admin instructions.     Blood Glucose Monitoring Suppl (ACCU-CHEK GUIDE) w/Device KIT See admin instructions.     gabapentin (NEURONTIN) 300 MG capsule Take a 300 mg capsule three times a day for two weeks following surgery.Then take a 300 mg capsule two times a day for two weeks. Then take a 300 mg capsule once a day for two weeks. Then discontinue. (Patient not taking: Reported on 05/29/2021) 84 capsule 0   hydrocortisone (ANUSOL-HC) 2.5 % rectal cream Apply to hemorrhoids daily. (Patient not taking: Reported on 05/29/2021) 30 g 1     Physical Exam: Blood pressure (!) 163/72, pulse 82, temperature 98.4 F (36.9 C), temperature source Oral, resp. rate 20, height _0  (1.575 m), weight 90.6 kg, SpO2 95 %. General: pleasant, obese female who is laying in bed in NAD HEENT: head is normocephalic, atraumatic.  Sclera are noninjected.  PERRL.  Ears and nose without any masses or lesions.  Mouth is pink and moist with excessive saliva secondary to NGT. Heart: regular, rate, and rhythm.  Normal s1,s2. No obvious murmurs, gallops, or rubs noted.  Palpable radial and pedal pulses bilaterally Lungs: CTAB, no wheezes, rhonchi, or rales noted.  Respiratory effort nonlabored Abd: soft, essentially NT, obese, +BS, no masses.  Reducible umbilical hernia noted.  Ventral hernia next to this is soft but difficult to gauge reduction due to body habitus.  NGT in place with about 300cc of output. MS: all 4 extremities are symmetrical with no cyanosis, clubbing, or edema. Skin: warm and dry with no masses, lesions, or rashes Neuro: Cranial nerves 2-12 grossly intact, sensation is normal throughout Psych: A&Ox3 with an appropriate affect.   Results for orders placed or  performed during the hospital encounter of 05/28/21 (from the past 48 hour(s))  Lipase, blood     Status: Abnormal   Collection Time: 05/28/21  9:50 PM  Result Value Ref Range   Lipase <10 (L) 11 - 51 U/L    Comment: Performed at [facility], [address] [place], [place] [address]  Comprehensive metabolic panel     Status: Abnormal   Collection Time: 05/28/21  9:50 PM  Result Value Ref Range   Sodium 140 135 - 145 mmol/L   Potassium 3.3 (L) 3.5 - 5.1 mmol/L   Chloride 101  98 - 111 mmol/L   CO2 23 22 - 32 mmol/L   Glucose, Bld 205 (H) 70 - 99 mg/dL    Comment: Glucose reference range applies only to samples taken after fasting for at least 8 hours.   BUN 18 8 - 23 mg/dL   Creatinine, Ser 1.22 (H) 0.44 - 1.00 mg/dL   Calcium 10.1 8.9 - 10.3 mg/dL   Total Protein 8.0 6.5 - 8.1 g/dL   Albumin 4.3 3.5 - 5.0 g/dL   AST 14 (L) 15 - 41 U/L   ALT 11 0 - 44 U/L   Alkaline Phosphatase 72 38 - 126 U/L   Total Bilirubin 0.6 0.3 - 1.2 mg/dL   GFR, Estimated 47 (L) >60 mL/min    Comment: (NOTE) Calculated using the CKD-EPI Creatinine Equation (2021)    Anion gap 16 (H) 5 - 15    Comment: Performed at [facility], [address], [place], [place] [address]  CBC     Status: Abnormal   Collection Time: 05/28/21  9:50 PM  Result Value Ref Range   WBC 8.0 4.0 - 10.5 K/uL   RBC 4.87 3.87 - 5.11 MIL/uL   Hemoglobin 15.2 (H) 12.0 - 15.0 g/dL   HCT 46.4 (H) 36.0 - 46.0 %   MCV 95.3 80.0 - 100.0 fL   MCH 31.2 26.0 - 34.0 pg   MCHC 32.8 30.0 - 36.0 g/dL   RDW 15.1 11.5 - 15.5 %   Platelets 364 150 - 400 K/uL   nRBC 0.0 0.0 - 0.2 %    Comment: Performed at [facility], [address], [place], [place] [address]  Lactic acid, plasma     Status: Abnormal   Collection Time: 05/28/21 10:09 PM  Result Value Ref Range   Lactic Acid, Venous 2.2 (HH) 0.5 - 1.9 mmol/L    Comment: CRITICAL RESULT CALLED TO, READ BACK BY AND VERIFIED  WITH: HONICUTT, B RN @ 4268 ON 05/28/21 BY AV Performed at [facility], [address], [place], [place] [address]   Resp Panel by RT-PCR (Flu A&B, Covid) Nasopharyngeal Swab     Status: None   Collection Time: 05/28/21 11:49 PM   Specimen: Nasopharyngeal Swab; Nasopharyngeal(NP) swabs in vial transport medium  Result Value Ref Range   SARS Coronavirus 2 by RT PCR NEGATIVE NEGATIVE    Comment: (NOTE) SARS-CoV-2 target nucleic acids are NOT DETECTED.  The SARS-CoV-2 RNA is generally detectable in upper respiratory specimens during the acute phase of infection. The lowest concentration of SARS-CoV-2 viral copies this assay can detect is 138 copies/mL. A negative result does not preclude SARS-Cov-2 infection and should not be used as the sole basis for treatment or other patient management decisions. A negative result may occur with  improper specimen collection/handling, submission of specimen other than nasopharyngeal swab, presence of viral mutation(s) within the areas targeted by this assay, and inadequate number of viral copies(<138 copies/mL). A negative result must be combined with clinical observations, patient history, and epidemiological information. The expected result is Negative.  Fact Sheet for Patients:  EntrepreneurPulse.com.au  Fact Sheet for Healthcare Providers:  IncredibleEmployment.be  This test is no t yet approved or cleared by the [place] FDA and  has been authorized for detection and/or diagnosis of SARS-CoV-2 by FDA under an Emergency Use Authorization (EUA). This EUA will remain  in effect (meaning this test can be used) for the duration of the COVID-19 declaration under Section 564(b)(1) of the Act, 21  U.S.C.section 360bbb-3(b)(1), unless the authorization is terminated  or revoked sooner.       Influenza A by PCR NEGATIVE NEGATIVE   Influenza B by PCR NEGATIVE NEGATIVE    Comment:  (NOTE) The Xpert Xpress SARS-CoV-2/FLU/RSV plus assay is intended as an aid in the diagnosis of influenza from Nasopharyngeal swab specimens and should not be used as a sole basis for treatment. Nasal washings and aspirates are unacceptable for Xpert Xpress SARS-CoV-2/FLU/RSV testing.  Fact Sheet for Patients: EntrepreneurPulse.com.au  Fact Sheet for Healthcare Providers: IncredibleEmployment.be  This test is not yet approved or cleared by the [place] FDA and has been authorized for detection and/or diagnosis of SARS-CoV-2 by FDA under an Emergency Use Authorization (EUA). This EUA will remain in effect (meaning this test can be used) for the duration of the COVID-19 declaration under Section 564(b)(1) of the Act, 21 U.S.C. section 360bbb-3(b)(1), unless the authorization is terminated or revoked.  Performed at [facility], [address], [place], [place] [address]   Lactic acid, plasma     Status: Abnormal   Collection Time: 05/29/21 12:02 AM  Result Value Ref Range   Lactic Acid, Venous 2.5 (HH) 0.5 - 1.9 mmol/L    Comment: CRITICAL VALUE NOTED.  VALUE IS CONSISTENT WITH PREVIOUSLY REPORTED AND CALLED VALUE. Performed at [facility], [address] [place], [place] [address]   Glucose, capillary     Status: Abnormal   Collection Time: 05/29/21  4:18 AM  Result Value Ref Range   Glucose-Capillary 140 (H) 70 - 99 mg/dL    Comment: Glucose reference range applies only to samples taken after fasting for at least 8 hours.  Basic metabolic panel     Status: Abnormal   Collection Time: 05/29/21  5:12 AM  Result Value Ref Range   Sodium 141 135 - 145 mmol/L   Potassium 3.5 3.5 - 5.1 mmol/L   Chloride 106 98 - 111 mmol/L   CO2 27 22 - 32 mmol/L   Glucose, Bld 150 (H) 70 - 99 mg/dL    Comment: Glucose reference range applies only to samples taken after fasting for at least 8 hours.   BUN 20 8 -  23 mg/dL   Creatinine, Ser 1.28 (H) 0.44 - 1.00 mg/dL   Calcium 9.2 8.9 - 10.3 mg/dL   GFR, Estimated 45 (L) >60 mL/min    Comment: (NOTE) Calculated using the CKD-EPI Creatinine Equation (2021)    Anion gap 8 5 - 15    Comment: Performed at [facility], [place] [address], [place], [place] [address]  Magnesium     Status: None   Collection Time: 05/29/21  5:12 AM  Result Value Ref Range   Magnesium 1.9 1.7 - 2.4 mg/dL    Comment: Performed at [facility], [place] [address], [place], [place] [address]  CBC     Status: Abnormal   Collection Time: 05/29/21  5:12 AM  Result Value Ref Range   WBC 7.9 4.0 - 10.5 K/uL   RBC 4.29 3.87 - 5.11 MIL/uL   Hemoglobin 13.7 12.0 - 15.0 g/dL   HCT 42.2 36.0 - 46.0 %   MCV 98.4 80.0 - 100.0 fL   MCH 31.9 26.0 - 34.0 pg   MCHC 32.5 30.0 - 36.0 g/dL   RDW 15.7 (H) 11.5 - 15.5 %   Platelets 312 150 - 400 K/uL   nRBC 0.0 0.0 - 0.2 %    Comment: Performed at [facility], [place] [address], [place], [place] [address]  Lactic acid, plasma     Status: None   Collection Time: 05/29/21  5:12 AM  Result Value Ref Range   Lactic Acid, Venous 1.8 0.5 - 1.9 mmol/L    Comment: Performed at [facility], [place] [address], [place], [place] [address]  Glucose, capillary     Status: Abnormal   Collection Time: 05/29/21  8:08 AM  Result Value Ref Range   Glucose-Capillary 123 (H) 70 - 99 mg/dL    Comment: Glucose reference range applies only to samples taken after fasting for at least 8 hours.  Urinalysis, Routine w reflex microscopic     Status: Abnormal   Collection Time: 05/29/21  8:26 AM  Result Value Ref Range   Color, Urine YELLOW YELLOW   APPearance CLEAR CLEAR   Specific Gravity, Urine >1.046 (H) 1.005 - 1.030   pH 6.0 5.0 - 8.0   Glucose, UA 50 (A) NEGATIVE mg/dL   Hgb urine dipstick NEGATIVE NEGATIVE   Bilirubin Urine NEGATIVE NEGATIVE   Ketones, ur 5 (A) NEGATIVE mg/dL    Protein, ur 100 (A) NEGATIVE mg/dL   Nitrite NEGATIVE NEGATIVE   Leukocytes,Ua NEGATIVE NEGATIVE   RBC / HPF 0-5 0 - 5 RBC/hpf   WBC, UA 0-5 0 - 5 WBC/hpf   Bacteria, UA RARE (A) NONE SEEN   Squamous Epithelial / LPF 6-10 0 - 5   Mucus PRESENT     Comment: Performed at [facility], [place] [address] [place], [place] [address]   DG Chest 2 View  Result Date: 05/28/2021 CLINICAL DATA:  Infection. EXAM: CHEST - 2 VIEW COMPARISON:  Chest x-ray 07/24/2013. FINDINGS: The heart size and mediastinal contours are within normal limits. Both lungs are clear. The visualized skeletal structures are unremarkable. IMPRESSION: No active cardiopulmonary disease. Electronically Signed   By: Ronney Asters M.D.   On: 05/28/2021 22:24   DG Abdomen 1 View  Result Date: 05/29/2021 CLINICAL DATA:  Tube placement EXAM: ABDOMEN - 1 VIEW COMPARISON:  None. FINDINGS: NG tube is in place with the tip in the stomach. Paucity of gas in the abdomen. IMPRESSION: NG tube in the stomach. Electronically Signed   By: Rolm Baptise M.D.   On: 05/29/2021 01:11   CT ABDOMEN PELVIS W CONTRAST  Result Date: 05/28/2021 CLINICAL DATA:  Upper abdominal pain EXAM: CT ABDOMEN AND PELVIS WITH CONTRAST TECHNIQUE: Multidetector CT imaging of the abdomen and pelvis was performed using the standard protocol following bolus administration of intravenous contrast. RADIATION DOSE REDUCTION: This exam was performed according to the departmental dose-optimization program which includes automated exposure control, adjustment of the mA and/or kV according to patient size and/or use of iterative reconstruction technique. CONTRAST:  122m OMNIPAQUE IOHEXOL 300 MG/ML  SOLN COMPARISON:  01/12/2019 FINDINGS: Lower chest: No acute abnormality. Small to moderate-sized hiatal hernia. Hepatobiliary: No focal hepatic abnormality. Gallbladder unremarkable. Pancreas: No focal abnormality or ductal dilatation. Spleen: No focal abnormality.  Normal  size. Adrenals/Urinary Tract: Adrenal glands unremarkable. 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 Decreasing size of right upper pole cyst, now measuring 1.6 cm compared to 3.7 cm previously. No hydronephrosis. Urinary bladder unremarkable. Stomach/Bowel: Postoperative changes in the right colon. There are multiple thick walled small bowel loops seen in the central and left abdomen. These loops are also mildly dilated and fluid-filled. Distal small bowel loops are decompressed and unremarkable. Vascular/Lymphatic: Aortic atherosclerosis. No evidence of  aneurysm or adenopathy. Reproductive: Uterus and adnexa unremarkable.  No mass. Other: There is a midline ventral hernia noted containing fat and a small bowel loop. This does not appear to be the cause of the dilated small bowel. There is fluid also within the hernia sac. Fluid adjacent to the dilated, thick walled left abdominal small bowel loops as well as adjacent to the liver. No free air. Musculoskeletal: No acute bony abnormality. IMPRESSION: Thick walled, dilated small bowel loops in the mid to left abdomen. Proximal to these thickened small bowel loops, jejunal small bowel loops are mildly dilated and fluid-filled. Findings are concerning for enteritis with a degree of small-bowel obstruction. This most likely reflects infectious or inflammatory enteritis. Mesenteric vessels are patent making ischemic enteritis less likely. Anterior lower abdominal wall ventral hernia containing fat, fluid and a small bowel loop. This does not appear to be the cause of the small bowel obstruction. Free fluid adjacent to the dilated thick-walled small bowel loops as well as adjacent to the liver. 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 Moderate hiatal hernia. Electronically Signed   By: Rolm Baptise M.D.   On: 05/28/2021 23:38      Assessment/Plan SBO likely secondary to enteritis The patient has been seen and her  imaging, chart, labs, previous notes, etc have all been reviewed.  The patient's history is more c/w enteritis that has resulted in a SBO secondary to wall thickening and edema.  She has minimal NGT output but no bowel function yet.  We will start the SBO protocol and follow her films and her clinical status.  Do not believe her ventral hernia is the source of her obstructive pattern on CT scan.  We will follow along.   FEN - NPO/IVFs VTE - lovenox ID - none currently needed  H/O colon cancer HTN DM  Moderate Medical Decision Making  Henreitta Cea, PA-C [facility] 05/29/2021, 11:04 AM Please see Amion for pager number during day hours 7:00am-4:30pm or 7:00am -11:30am on weekends

## 2021-05-29 NOTE — Progress Notes (Signed)
Transition of Care [facility]) Screening Note  Patient Details  Name: Christina [name] Date of Birth: 1950-12-01  Transition of Care [facility]) CM/SW Contact:    Sherie Don, LCSW Phone Number: 05/29/2021, 9:31 AM  Transition of Care Department [facility]) has reviewed patient and no TOC needs have been identified at this time. We will continue to monitor patient advancement through interdisciplinary progression rounds. If new patient transition needs arise, please place a TOC consult.

## 2021-05-30 LAB — CBC WITH DIFFERENTIAL/PLATELET
Abs Immature Granulocytes: 0.01 10*3/uL (ref 0.00–0.07)
Basophils Absolute: 0 10*3/uL (ref 0.0–0.1)
Basophils Relative: 1 %
Eosinophils Absolute: 0.1 10*3/uL (ref 0.0–0.5)
Eosinophils Relative: 2 %
HCT: 37.3 % (ref 36.0–46.0)
Hemoglobin: 11.7 g/dL — ABNORMAL LOW (ref 12.0–15.0)
Immature Granulocytes: 0 %
Lymphocytes Relative: 24 %
Lymphs Abs: 1.3 10*3/uL (ref 0.7–4.0)
MCH: 31.4 pg (ref 26.0–34.0)
MCHC: 31.4 g/dL (ref 30.0–36.0)
MCV: 100 fL (ref 80.0–100.0)
Monocytes Absolute: 0.8 10*3/uL (ref 0.1–1.0)
Monocytes Relative: 15 %
Neutro Abs: 3.1 10*3/uL (ref 1.7–7.7)
Neutrophils Relative %: 58 %
Platelets: 257 10*3/uL (ref 150–400)
RBC: 3.73 MIL/uL — ABNORMAL LOW (ref 3.87–5.11)
RDW: 15.7 % — ABNORMAL HIGH (ref 11.5–15.5)
WBC: 5.3 10*3/uL (ref 4.0–10.5)
nRBC: 0 % (ref 0.0–0.2)

## 2021-05-30 LAB — COMPREHENSIVE METABOLIC PANEL
ALT: 13 U/L (ref 0–44)
AST: 14 U/L — ABNORMAL LOW (ref 15–41)
Albumin: 3.1 g/dL — ABNORMAL LOW (ref 3.5–5.0)
Alkaline Phosphatase: 47 U/L (ref 38–126)
Anion gap: 8 (ref 5–15)
BUN: 20 mg/dL (ref 8–23)
CO2: 25 mmol/L (ref 22–32)
Calcium: 8.7 mg/dL — ABNORMAL LOW (ref 8.9–10.3)
Chloride: 111 mmol/L (ref 98–111)
Creatinine, Ser: 1.25 mg/dL — ABNORMAL HIGH (ref 0.44–1.00)
GFR, Estimated: 46 mL/min — ABNORMAL LOW (ref >60)
Glucose, Bld: 114 mg/dL — ABNORMAL HIGH (ref 70–99)
Potassium: 3.3 mmol/L — ABNORMAL LOW (ref 3.5–5.1)
Sodium: 144 mmol/L (ref 135–145)
Total Bilirubin: 0.8 mg/dL (ref 0.3–1.2)
Total Protein: 6 g/dL — ABNORMAL LOW (ref 6.5–8.1)

## 2021-05-30 LAB — SEDIMENTATION RATE: Sed Rate: 45 mm/hr — ABNORMAL HIGH (ref 0–22)

## 2021-05-30 LAB — C-REACTIVE PROTEIN: CRP: 0.9 mg/dL (ref <1.0)

## 2021-05-30 LAB — PHOSPHORUS: Phosphorus: 3.5 mg/dL (ref 2.5–4.6)

## 2021-05-30 LAB — GLUCOSE, CAPILLARY
Glucose-Capillary: 107 mg/dL — ABNORMAL HIGH (ref 70–99)
Glucose-Capillary: 108 mg/dL — ABNORMAL HIGH (ref 70–99)
Glucose-Capillary: 112 mg/dL — ABNORMAL HIGH (ref 70–99)
Glucose-Capillary: 122 mg/dL — ABNORMAL HIGH (ref 70–99)
Glucose-Capillary: 156 mg/dL — ABNORMAL HIGH (ref 70–99)
Glucose-Capillary: 97 mg/dL (ref 70–99)

## 2021-05-30 LAB — MAGNESIUM: Magnesium: 2 mg/dL (ref 1.7–2.4)

## 2021-05-30 MED ORDER — POTASSIUM CHLORIDE IN NACL 20-0.9 MEQ/L-% IV SOLN
INTRAVENOUS | Status: AC
  Filled 2021-05-30 (×2): qty 1000

## 2021-05-30 MED ORDER — POTASSIUM CHLORIDE 10 MEQ/100ML IV SOLN
10.0000 meq | INTRAVENOUS | Status: AC
  Administered 2021-05-30 (×3): 10 meq via INTRAVENOUS
  Filled 2021-05-30 (×3): qty 100

## 2021-05-30 MED ORDER — AMLODIPINE BESYLATE 10 MG PO TABS
10.0000 mg | ORAL_TABLET | Freq: Every day | ORAL | Status: DC
  Administered 2021-05-30 – 2021-06-02 (×4): 10 mg via ORAL
  Filled 2021-05-30 (×4): qty 1

## 2021-05-30 NOTE — Progress Notes (Signed)
Dr. Florene Glen paged.  Informed that NG tube came out.  No complaints of nausea/vomiting.  Orders received to leave out.

## 2021-05-30 NOTE — Progress Notes (Signed)
Patient ambulated with walker.  Assisted with bath and patient currently sitting in chair.  While standing, patient accidentally removed NG tube.  Denies any nausea and no emesis present.  MD to be informed.  Will continue to monitor.

## 2021-05-30 NOTE — Progress Notes (Signed)
Subjective: Feeling better today.  NGT out as it came out about 10 minutes ago by accident.  No nausea currently.  No abdominal pain at this time.    ROS: See above, otherwise other systems negative  Objective: Vital signs in last 24 hours: Temp:  [98.4 F (36.9 C)-99 F (37.2 C)] 98.6 F (37 C) (01/27 0544) Pulse Rate:  [74-85] 74 (01/27 0544) Resp:  [16-20] 16 (01/27 0544) BP: (128-163)/(56-92) 156/61 (01/27 0544) SpO2:  [94 %-97 %] 94 % (01/27 0544) Weight:  [91 kg] 91 kg (01/27 0544) Last BM Date: 05/27/21  Intake/Output from previous day: 01/26 0701 - 01/27 0700 In: 1934.2 [I.V.:1934.2] Out: 850 [Emesis/NG output:850] Intake/Output this shift: No intake/output data recorded.  PE: Abd: soft, not really tender, ventral hernia is able to be palpated today.  Some contents palpable, but difficult to reduce as she is sitting up in her chair and her body habitus.  No tenderness in LUQ.  Lab Results:  Recent Labs    05/29/21 0512 05/30/21 0320  WBC 7.9 5.3  HGB 13.7 11.7*  HCT 42.2 37.3  PLT 312 257   BMET Recent Labs    05/29/21 0512 05/30/21 0320  NA 141 144  K 3.5 3.3*  CL 106 111  CO2 27 25  GLUCOSE 150* 114*  BUN 20 20  CREATININE 1.28* 1.25*  CALCIUM 9.2 8.7*   PT/INR No results for input(s): LABPROT, INR in the last 72 hours. CMP     Component Value Date/Time   NA 144 05/30/2021 0320   K 3.3 (L) 05/30/2021 0320   CL 111 05/30/2021 0320   CO2 25 05/30/2021 0320   GLUCOSE 114 (H) 05/30/2021 0320   BUN 20 05/30/2021 0320   CREATININE 1.25 (H) 05/30/2021 0320   CALCIUM 8.7 (L) 05/30/2021 0320   PROT 6.0 (L) 05/30/2021 0320   ALBUMIN 3.1 (L) 05/30/2021 0320   AST 14 (L) 05/30/2021 0320   ALT 13 05/30/2021 0320   ALKPHOS 47 05/30/2021 0320   BILITOT 0.8 05/30/2021 0320   GFRNONAA 46 (L) 05/30/2021 0320   GFRAA 52 (L) 01/14/2019 0331   Lipase     Component Value Date/Time   LIPASE <10 (L) 05/28/2021 2150        Studies/Results: DG Chest 2 View  Result Date: 05/28/2021 CLINICAL DATA:  Infection. EXAM: CHEST - 2 VIEW COMPARISON:  Chest x-ray 07/24/2013. FINDINGS: The heart size and mediastinal contours are within normal limits. Both lungs are clear. The visualized skeletal structures are unremarkable. IMPRESSION: No active cardiopulmonary disease. Electronically Signed   By: Ronney Asters M.D.   On: 05/28/2021 22:24   DG Abdomen 1 View  Result Date: 05/29/2021 CLINICAL DATA:  Tube placement EXAM: ABDOMEN - 1 VIEW COMPARISON:  None. FINDINGS: NG tube is in place with the tip in the stomach. Paucity of gas in the abdomen. IMPRESSION: NG tube in the stomach. Electronically Signed   By: Rolm Baptise M.D.   On: 05/29/2021 01:11   CT ABDOMEN PELVIS W CONTRAST  Result Date: 05/28/2021 CLINICAL DATA:  Upper abdominal pain EXAM: CT ABDOMEN AND PELVIS WITH CONTRAST TECHNIQUE: Multidetector CT imaging of the abdomen and pelvis was performed using the standard protocol following bolus administration of intravenous contrast. RADIATION DOSE REDUCTION: This exam was performed according to the departmental dose-optimization program which includes automated exposure control, adjustment of the mA and/or kV according to patient size and/or use of iterative reconstruction technique. CONTRAST:  132mL OMNIPAQUE  IOHEXOL 300 MG/ML  SOLN COMPARISON:  01/12/2019 FINDINGS: Lower chest: No acute abnormality. Small to moderate-sized hiatal hernia. Hepatobiliary: No focal hepatic abnormality. Gallbladder unremarkable. Pancreas: No focal abnormality or ductal dilatation. Spleen: No focal abnormality.  Normal size. Adrenals/Urinary Tract: Adrenal glands unremarkable. 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 Decreasing size of right upper pole cyst, now measuring 1.6 cm compared to 3.7 cm previously. No hydronephrosis. Urinary bladder unremarkable. Stomach/Bowel: Postoperative changes in the  right colon. There are multiple thick walled small bowel loops seen in the central and left abdomen. These loops are also mildly dilated and fluid-filled. Distal small bowel loops are decompressed and unremarkable. Vascular/Lymphatic: Aortic atherosclerosis. No evidence of aneurysm or adenopathy. Reproductive: Uterus and adnexa unremarkable.  No mass. Other: There is a midline ventral hernia noted containing fat and a small bowel loop. This does not appear to be the cause of the dilated small bowel. There is fluid also within the hernia sac. Fluid adjacent to the dilated, thick walled left abdominal small bowel loops as well as adjacent to the liver. No free air. Musculoskeletal: No acute bony abnormality. IMPRESSION: Thick walled, dilated small bowel loops in the mid to left abdomen. Proximal to these thickened small bowel loops, jejunal small bowel loops are mildly dilated and fluid-filled. Findings are concerning for enteritis with a degree of small-bowel obstruction. This most likely reflects infectious or inflammatory enteritis. Mesenteric vessels are patent making ischemic enteritis less likely. Anterior lower abdominal wall ventral hernia containing fat, fluid and a small bowel loop. This does not appear to be the cause of the small bowel obstruction. Free fluid adjacent to the dilated thick-walled small bowel loops as well as adjacent to the liver. 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 Moderate hiatal hernia. Electronically Signed   By: Rolm Baptise M.D.   On: 05/28/2021 23:38   DG Abd Portable 1V-Small Bowel Obstruction Protocol-initial, 8 hr delay  Result Date: 05/29/2021 CLINICAL DATA:  Small bowel protocol EXAM: PORTABLE ABDOMEN - 1 VIEW COMPARISON:  CT 05/28/2021 FINDINGS: Esophageal tube tip and side port overlie the proximal stomach. Enteral contrast is present within the colon. There is contrast within the urinary bladder. There is some air distended small bowel in the left lower  quadrant. IMPRESSION: Small bowel distension within the left lower quadrant. Enteral contrast is present in the colon. Electronically Signed   By: Donavan Foil M.D.   On: 05/29/2021 21:12    Anti-infectives: Anti-infectives (From admission, onward)    None        Assessment/Plan SBO likely secondary to enteritis -follow up films show contrast in her colon -Dc NGT today and try CLD -she asked about a hernia repair.  We discussed that this isn't emergent as it is not the source of her obstruction and it is best to get over this current episode and then she can follow up in our office to discuss elective hernia repair.  She is agreeable -mobilize -labs, vitals, I/Os, imaging reviewed for last 24 hrs     FEN - CLD/IVFs VTE - lovenox ID - none currently needed   H/O colon cancer HTN DM  Moderate Medical Decision Making  LOS: 1 day    Christina [name] , [facility] Surgery 05/30/2021, 9:20 AM Please see Amion for pager number during day hours 7:00am-4:30pm or 7:00am -11:30am on weekends

## 2021-05-30 NOTE — Progress Notes (Signed)
PROGRESS NOTE    Christina [name]  WUJ:[id number] DOB: Sep 23, 1950 DOA: 05/28/2021 PCP: Christina Cruel, MD  Chief Complaint  Patient presents with   Abdominal Pain    Brief Narrative:   Christina [name] is Christina [name] pleasant 71 y.o. female with medical history significant for hypertension, type 2 diabetes mellitus, and CKD stage IIIa, now presenting to the emergency department for evaluation of nominal pain with nausea, vomiting, and diarrhea.  Patient reports that she had been in her usual state of health until 05/27/2021 when she developed generalized abdominal pain, 2 episodes of nonbloody diarrhea, and severe nausea with too numerous to count episodes of vomiting.  She is not aware of any sick contacts.  She had similar symptoms in September 2020 that required hospitalization and resolved with conservative management.  She reports history of colon cancer that was treated with resection of 12 inches of colon approximately 15 to 20 years ago.  She reports Christina [name] fever to 101.1 F yesterday.   Christina [name] Course: Upon arrival to the [name], patient is found to be afebrile, saturating well on room air, and with stable blood pressure.  EKG features sinus rhythm and chest x-ray negative for acute cardiopulmonary disease.  Chemistry panel notable for potassium 3.3, glucose 205, and creatinine 1.22.  CBC with mild polycythemia.  Lactic acid was 2.2.  COVID and influenza PCR negative.  CT of the abdomen and pelvis demonstrates thick-walled, dilated, and fluid-filled loops of small bowel concerning for infectious or inflammatory enteritis with Christina [name].  NG tube was placed in the [name], 1 L of saline and Zofran were administered, and the patient was transferred to Christina [name] for admission.    Assessment & Plan:   Principal Problem:   [name] (small bowel obstruction) (HCC) Active Problems:   Acute gastroenteritis   Type 2 diabetes mellitus with stage 3 chronic kidney disease (HCC)   Hypertension   Stage 3a chronic  kidney disease (CKD) (HCC)   Hypokalemia   * [name] (small bowel obstruction) ([place])- (present on admission) CT concerning for enteritis with degree of [name] (infectious vs inflammatory enteritis) NG tube out today, no nausea/vomiting - monitor Currently afebrile without WBC count, will hold off on abx Appreciate surgery assistance -> clear liquid diet Will send inflammatory markers - ESR 45, CRP wnl Fecal lactoferrin, if appropriate GI apth panel and c diff Recurrent since 2020, GI outpatient follow up is reasonable  Type 2 diabetes mellitus with stage 3 chronic kidney disease (Christina [name])- (present on admission) A1c 6.1 in Nov 2022 Continue SSI  Hypertension- (present on admission) Continue to monitor Start amlodipine, continue to hold benazepril  Stage 3a chronic kidney disease (CKD) (Christina [name])- (present on admission) Will follow, appears close to baseline  Hypokalemia Replace and follow   DVT prophylaxis: lovenox Code Status: full Family Communication: none Disposition:   Status is: Inpatient  Remains inpatient appropriate because: [name], pending resolution       Consultants:  surgery  Procedures:  none  Antimicrobials:  Anti-infectives (From admission, onward)    None       Subjective: Denies flatus and BM  Objective: Vitals:   05/29/21 1428 05/29/21 1810 05/29/21 2138 05/30/21 0544  BP: (!) 159/92 (!) 128/56 136/73 (!) 156/61  Pulse: 85 78 81 74  Resp: '20 20 16 16  ' Temp: 99 F (37.2 C) 98.8 F (37.1 C) 98.5 F (36.9 C) 98.6 F (37 C)  TempSrc: Oral Oral Oral Oral  SpO2: 97% 94% 97% 94%  Weight:    91 kg  Height:        Intake/Output Summary (Last 24 hours) at 05/30/2021 1301 Last data filed at 05/30/2021 1200 Gross per 24 hour  Intake 1907.64 ml  Output 850 ml  Net 1057.64 ml   Filed Weights   05/28/21 2149 05/29/21 0435 05/30/21 0544  Weight: 90.7 kg 90.6 kg 91 kg    Examination:  General: No acute distress. Cardiovascular: RRR Lungs:  unlabored Abdomen: Soft, nontender, nondistended Neurological: Alert and oriented 3. Moves all extremities 4. Cranial nerves II through XII grossly intact. Skin: Warm and dry. No rashes or lesions. Extremities: No clubbing or cyanosis. No edema.   Data Reviewed: I have personally reviewed following labs and imaging studies  CBC: Recent Labs  Lab 05/28/21 2150 05/29/21 0512 05/30/21 0320  WBC 8.0 7.9 5.3  NEUTROABS  --   --  3.1  HGB 15.2* 13.7 11.7*  HCT 46.4* 42.2 37.3  MCV 95.3 98.4 100.0  PLT 364 312 284    Basic Metabolic Panel: Recent Labs  Lab 05/28/21 2150 05/29/21 0512 05/30/21 0320  NA 140 141 144  K 3.3* 3.5 3.3*  CL 101 106 111  CO2 '23 27 25  ' GLUCOSE 205* 150* 114*  BUN '18 20 20  ' CREATININE 1.22* 1.28* 1.25*  CALCIUM 10.1 9.2 8.7*  MG  --  1.9 2.0  PHOS  --   --  3.5    GFR: Estimated Creatinine Clearance: 43.3 mL/min (Christina [name]) (by C-G formula based on SCr of 1.25 mg/dL (H)).  Liver Function Tests: Recent Labs  Lab 05/28/21 2150 05/30/21 0320  AST 14* 14*  ALT 11 13  ALKPHOS 72 47  BILITOT 0.6 0.8  PROT 8.0 6.0*  ALBUMIN 4.3 3.1*    CBG: Recent Labs  Lab 05/29/21 1938 05/29/21 2316 05/30/21 0300 05/30/21 0757 05/30/21 1205  GLUCAP 104* 93 107* 97 108*     Recent Results (from the past 240 hour(s))  Resp Panel by RT-PCR (Flu Stanislaw Acton&B, Covid) Nasopharyngeal Swab     Status: None   Collection Time: 05/28/21 11:49 PM   Specimen: Nasopharyngeal Swab; Nasopharyngeal(NP) swabs in vial transport medium  Result Value Ref Range Status   SARS Coronavirus 2 by RT PCR NEGATIVE NEGATIVE Final    Comment: (NOTE) SARS-CoV-2 target nucleic acids are NOT DETECTED.  The SARS-CoV-2 RNA is generally detectable in upper respiratory specimens during the acute phase of infection. The lowest concentration of SARS-CoV-2 viral copies this assay can detect is 138 copies/mL. Christina [name] negative result does not preclude SARS-Cov-2 infection and should not be used as the  sole basis for treatment or other patient management decisions. Lavida Patch negative result may occur with  improper specimen collection/handling, submission of specimen other than nasopharyngeal swab, presence of viral mutation(s) within the areas targeted by this assay, and inadequate number of viral copies(<138 copies/mL). Christina [name] negative result must be combined with clinical observations, patient history, and epidemiological information. The expected result is Negative.  Fact Sheet for Patients:  EntrepreneurPulse.com.au  Fact Sheet for Healthcare Providers:  IncredibleEmployment.be  This test is no t yet approved or cleared by the [place] FDA and  has been authorized for detection and/or diagnosis of SARS-CoV-2 by FDA under an Emergency Use Authorization (EUA). This EUA will remain  in effect (meaning this test can be used) for the duration of the COVID-19 declaration under Section 564(b)(1) of the Act, 21 U.S.C.section 360bbb-3(b)(1), unless the authorization is terminated  or revoked sooner.  Influenza Christina [name] by PCR NEGATIVE NEGATIVE Final   Influenza B by PCR NEGATIVE NEGATIVE Final    Comment: (NOTE) The Xpert Xpress SARS-CoV-2/FLU/RSV plus assay is intended as an aid in the diagnosis of influenza from Nasopharyngeal swab specimens and should not be used as Christina Sivertsen sole basis for treatment. Nasal washings and aspirates are unacceptable for Xpert Xpress SARS-CoV-2/FLU/RSV testing.  Fact Sheet for Patients: EntrepreneurPulse.com.au  Fact Sheet for Healthcare Providers: IncredibleEmployment.be  This test is not yet approved or cleared by the [place] FDA and has been authorized for detection and/or diagnosis of SARS-CoV-2 by FDA under an Emergency Use Authorization (EUA). This EUA will remain in effect (meaning this test can be used) for the duration of the COVID-19 declaration under Section 564(b)(1) of the  Act, 21 U.S.C. section 360bbb-3(b)(1), unless the authorization is terminated or revoked.  Performed at [facility], [address] [place], [place] [address]          Radiology Studies: DG Chest 2 View  Result Date: 05/28/2021 CLINICAL DATA:  Infection. EXAM: CHEST - 2 VIEW COMPARISON:  Chest x-ray 07/24/2013. FINDINGS: The heart size and mediastinal contours are within normal limits. Both lungs are clear. The visualized skeletal structures are unremarkable. IMPRESSION: No active cardiopulmonary disease. Electronically Signed   By: Christina Asters M.D.   On: 05/28/2021 22:24   DG Abdomen 1 View  Result Date: 05/29/2021 CLINICAL DATA:  Tube placement EXAM: ABDOMEN - 1 VIEW COMPARISON:  None. FINDINGS: NG tube is in place with the tip in the stomach. Paucity of gas in the abdomen. IMPRESSION: NG tube in the stomach. Electronically Signed   By: Christina Baptise M.D.   On: 05/29/2021 01:11   CT ABDOMEN PELVIS W CONTRAST  Result Date: 05/28/2021 CLINICAL DATA:  Upper abdominal pain EXAM: CT ABDOMEN AND PELVIS WITH CONTRAST TECHNIQUE: Multidetector CT imaging of the abdomen and pelvis was performed using the standard protocol following bolus administration of intravenous contrast. RADIATION DOSE REDUCTION: This exam was performed according to the departmental dose-optimization program which includes automated exposure control, adjustment of the mA and/or kV according to patient size and/or use of iterative reconstruction technique. CONTRAST:  128m OMNIPAQUE IOHEXOL 300 MG/ML  SOLN COMPARISON:  01/12/2019 FINDINGS: Lower chest: No acute abnormality. Small to moderate-sized hiatal hernia. Hepatobiliary: No focal hepatic abnormality. Gallbladder unremarkable. Pancreas: No focal abnormality or ductal dilatation. Spleen: No focal abnormality.  Normal size. Adrenals/Urinary Tract: Adrenal glands unremarkable. 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 Decreasing size of right upper pole cyst, now measuring 1.6 cm compared to 3.7 cm previously. No hydronephrosis. Urinary bladder unremarkable. Stomach/Bowel: Postoperative changes in the right colon. There are multiple thick walled small bowel loops seen in the central and left abdomen. These loops are also mildly dilated and fluid-filled. Distal small bowel loops are decompressed and unremarkable. Vascular/Lymphatic: Aortic atherosclerosis. No evidence of aneurysm or adenopathy. Reproductive: Uterus and adnexa unremarkable.  No mass. Other: There is Calil Amor midline ventral hernia noted containing fat and Jozsef Wescoat small bowel loop. This does not appear to be the cause of the dilated small bowel. There is fluid also within the hernia sac. Fluid adjacent to the dilated, thick walled left abdominal small bowel loops as well as adjacent to the liver. No free air. Musculoskeletal: No acute bony abnormality. IMPRESSION: Thick walled, dilated small bowel loops in the mid to left abdomen. Proximal to these thickened small bowel loops, jejunal small bowel loops are mildly dilated and  fluid-filled. Findings are concerning for enteritis with Teddrick Mallari degree of small-bowel obstruction. This most likely reflects infectious or inflammatory enteritis. Mesenteric vessels are patent making ischemic enteritis less likely. Anterior lower abdominal wall ventral hernia containing fat, fluid and Christina Spagnuolo small bowel loop. This does not appear to be the cause of the small bowel obstruction. Free fluid adjacent to the dilated thick-walled small bowel loops as well as adjacent to the liver. 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 Moderate hiatal hernia. Electronically Signed   By: Christina Baptise M.D.   On: 05/28/2021 23:38   DG Abd Portable 1V-Small Bowel Obstruction Protocol-initial, 8 hr delay  Result Date: 05/29/2021 CLINICAL DATA:  Small bowel protocol EXAM: PORTABLE ABDOMEN - 1 VIEW COMPARISON:  CT 05/28/2021 FINDINGS: Esophageal tube  tip and side port overlie the proximal stomach. Enteral contrast is present within the colon. There is contrast within the urinary bladder. There is some air distended small bowel in the left lower quadrant. IMPRESSION: Small bowel distension within the left lower quadrant. Enteral contrast is present in the colon. Electronically Signed   By: Donavan Foil M.D.   On: 05/29/2021 21:12        Scheduled Meds:  amLODipine  10 mg Oral Daily   enoxaparin (LOVENOX) injection  40 mg Subcutaneous Q24H   insulin aspart  0-9 Units Subcutaneous Q4H   Continuous Infusions:  0.9 % NaCl with KCl 20 mEq / L 85 mL/hr at 05/30/21 0447     LOS: 1 day    Time spent: over 30 min    Fayrene Helper, MD [facility]   To contact the attending provider between 7A-7P or the covering provider during after hours 7P-7A, please log into the web site [email or web address] and access using universal [place] password for that web site. If you do not have the password, please call the hospital operator.  05/30/2021, 1:01 PM

## 2021-05-31 ENCOUNTER — Inpatient Hospital Stay (HOSPITAL_COMMUNITY): Payer: Medicare HMO

## 2021-05-31 LAB — GLUCOSE, CAPILLARY
Glucose-Capillary: 89 mg/dL (ref 70–99)
Glucose-Capillary: 87 mg/dL (ref 70–99)
Glucose-Capillary: 87 mg/dL (ref 70–99)
Glucose-Capillary: 94 mg/dL (ref 70–99)
Glucose-Capillary: 104 mg/dL — ABNORMAL HIGH (ref 70–99)

## 2021-05-31 LAB — CBC WITH DIFFERENTIAL/PLATELET
Abs Immature Granulocytes: 0.03 10*3/uL (ref 0.00–0.07)
Basophils Absolute: 0 10*3/uL (ref 0.0–0.1)
Basophils Relative: 1 %
Eosinophils Absolute: 0.2 10*3/uL (ref 0.0–0.5)
Eosinophils Relative: 4 %
HCT: 34.6 % — ABNORMAL LOW (ref 36.0–46.0)
Hemoglobin: 10.9 g/dL — ABNORMAL LOW (ref 12.0–15.0)
Immature Granulocytes: 1 %
Lymphocytes Relative: 25 %
Lymphs Abs: 1.2 10*3/uL (ref 0.7–4.0)
MCH: 31.6 pg (ref 26.0–34.0)
MCHC: 31.5 g/dL (ref 30.0–36.0)
MCV: 100.3 fL — ABNORMAL HIGH (ref 80.0–100.0)
Monocytes Absolute: 0.6 10*3/uL (ref 0.1–1.0)
Monocytes Relative: 12 %
Neutro Abs: 2.7 10*3/uL (ref 1.7–7.7)
Neutrophils Relative %: 57 %
Platelets: 225 10*3/uL (ref 150–400)
RBC: 3.45 MIL/uL — ABNORMAL LOW (ref 3.87–5.11)
RDW: 15.6 % — ABNORMAL HIGH (ref 11.5–15.5)
WBC: 4.7 10*3/uL (ref 4.0–10.5)
nRBC: 0 % (ref 0.0–0.2)

## 2021-05-31 LAB — COMPREHENSIVE METABOLIC PANEL
ALT: 12 U/L (ref 0–44)
AST: 14 U/L — ABNORMAL LOW (ref 15–41)
Albumin: 3.1 g/dL — ABNORMAL LOW (ref 3.5–5.0)
Alkaline Phosphatase: 43 U/L (ref 38–126)
Anion gap: 6 (ref 5–15)
BUN: 17 mg/dL (ref 8–23)
CO2: 25 mmol/L (ref 22–32)
Calcium: 8.7 mg/dL — ABNORMAL LOW (ref 8.9–10.3)
Chloride: 106 mmol/L (ref 98–111)
Creatinine, Ser: 1.26 mg/dL — ABNORMAL HIGH (ref 0.44–1.00)
GFR, Estimated: 46 mL/min — ABNORMAL LOW (ref >60)
Glucose, Bld: 98 mg/dL (ref 70–99)
Potassium: 3.5 mmol/L (ref 3.5–5.1)
Sodium: 137 mmol/L (ref 135–145)
Total Bilirubin: 0.7 mg/dL (ref 0.3–1.2)
Total Protein: 6 g/dL — ABNORMAL LOW (ref 6.5–8.1)

## 2021-05-31 LAB — MAGNESIUM: Magnesium: 1.8 mg/dL (ref 1.7–2.4)

## 2021-05-31 LAB — PHOSPHORUS: Phosphorus: 3.1 mg/dL (ref 2.5–4.6)

## 2021-05-31 NOTE — Progress Notes (Signed)
° °  Subjective/Chief Complaint: Feeling better again.  Tolerating NG out and clears No bm yet   Objective: Vital signs in last 24 hours: Temp:  [98 F (36.7 C)-98.6 F (37 C)] 98 F (36.7 C) (01/27 2057) Pulse Rate:  [65-66] 65 (01/27 2057) Resp:  [16-20] 16 (01/27 2057) BP: (130-163)/(52-69) 130/52 (01/27 2057) SpO2:  [93 %-98 %] 93 % (01/27 2057) Last BM Date: 05/28/21  Intake/Output from previous day: 01/27 0701 - 01/28 0700 In: 750 [P.O.:450; IV Piggyback:300] Out: 0  Intake/Output this shift: Total I/O In: 400 [P.O.:400] Out: 400 [Urine:400]  Exam: Awake and alert Abdomen soft, ventral hernia unchanged, non-tender  Lab Results:  Recent Labs    05/30/21 0320 05/31/21 0315  WBC 5.3 4.7  HGB 11.7* 10.9*  HCT 37.3 34.6*  PLT 257 225   BMET Recent Labs    05/30/21 0320 05/31/21 0315  NA 144 137  K 3.3* 3.5  CL 111 106  CO2 25 25  GLUCOSE 114* 98  BUN 20 17  CREATININE 1.25* 1.26*  CALCIUM 8.7* 8.7*   PT/INR No results for input(s): LABPROT, INR in the last 72 hours. ABG No results for input(s): PHART, HCO3 in the last 72 hours.  Invalid input(s): PCO2, PO2  Studies/Results: DG Abd Portable 1V-Small Bowel Obstruction Protocol-initial, 8 hr delay  Result Date: 05/29/2021 CLINICAL DATA:  Small bowel protocol EXAM: PORTABLE ABDOMEN - 1 VIEW COMPARISON:  CT 05/28/2021 FINDINGS: Esophageal tube tip and side port overlie the proximal stomach. Enteral contrast is present within the colon. There is contrast within the urinary bladder. There is some air distended small bowel in the left lower quadrant. IMPRESSION: Small bowel distension within the left lower quadrant. Enteral contrast is present in the colon. Electronically Signed   By: Donavan Foil M.D.   On: 05/29/2021 21:12    Anti-infectives: Anti-infectives (From admission, onward)    None       Assessment/Plan: SBO likely secondary to enteritis  Improving Will try a full liquid  diet Ambulate  Moderate medical decision making  Coralie Keens MD 05/31/2021

## 2021-05-31 NOTE — Progress Notes (Signed)
PROGRESS NOTE    Christina [name]  RWE:[id number] DOB: 1951-04-17 DOA: 05/28/2021 PCP: [name], MD  Chief Complaint  Patient presents with   Abdominal Pain    Brief Narrative:   Christina [name] is Trevonne [name] pleasant 71 y.o. female with medical history significant for hypertension, type 2 diabetes mellitus, and CKD stage IIIa, now presenting to the emergency department for evaluation of nominal pain with nausea, vomiting, and diarrhea.  Patient reports that she had been in her usual state of health until 05/27/2021 when she developed generalized abdominal pain, 2 episodes of nonbloody diarrhea, and severe nausea with too numerous to count episodes of vomiting.  She is not aware of any sick contacts.  She had similar symptoms in September 2020 that required hospitalization and resolved with conservative management.  She reports history of colon cancer that was treated with resection of 12 inches of colon approximately 15 to 20 years ago.  She reports Christina [name] fever to 101.1 F yesterday.   [facility] ED Course: Upon arrival to the ED, patient is found to be afebrile, saturating well on room air, and with stable blood pressure.  EKG features sinus rhythm and chest x-ray negative for acute cardiopulmonary disease.  Chemistry panel notable for potassium 3.3, glucose 205, and creatinine 1.22.  CBC with mild polycythemia.  Lactic acid was 2.2.  COVID and influenza PCR negative.  CT of the abdomen and pelvis demonstrates thick-walled, dilated, and fluid-filled loops of small bowel concerning for infectious or inflammatory enteritis with Almyra Birman degree of SBO.  NG tube was placed in the ED, 1 L of saline and Zofran were administered, and the patient was transferred to [facility] for admission.    Assessment & Plan:   Principal Problem:   SBO (small bowel obstruction) (HCC) Active Problems:   Acute gastroenteritis   Type 2 diabetes mellitus with stage 3 chronic kidney disease (HCC)   Hypertension   Stage 3a chronic  kidney disease (CKD) (HCC)   Hypokalemia   * SBO (small bowel obstruction) ([place])- (present on admission) CT concerning for enteritis with degree of SBO (infectious vs inflammatory enteritis) NG tube out 1/27, no nausea/vomiting - monitor Currently afebrile without WBC count, will hold off on abx Appreciate surgery assistance -> clear liquid diet - will continue, awaiting note for today Starting to have flatus, no BM yet Will send inflammatory markers - ESR 45, CRP wnl Fecal lactoferrin, if appropriate GI apth panel and c diff Recurrent since 2020, GI outpatient follow up is reasonable  Type 2 diabetes mellitus with stage 3 chronic kidney disease ([place])- (present on admission) A1c 6.1 in Nov 2022 Continue SSI  Hypertension- (present on admission) Continue to monitor Start amlodipine, continue to hold benazepril  Stage 3a chronic kidney disease (CKD) ([place])- (present on admission) Will follow, appears close to baseline  Hypokalemia Replace and follow   DVT prophylaxis: lovenox Code Status: full Family Communication: none Disposition:   Status is: Inpatient  Remains inpatient appropriate because: SBO, pending resolution       Consultants:  surgery  Procedures:  none  Antimicrobials:  Anti-infectives (From admission, onward)    None       Subjective: Starting to have Christina [name] little flatus No BM yet  Objective: Vitals:   05/29/21 2138 05/30/21 0544 05/30/21 1406 05/30/21 2057  BP: 136/73 (!) 156/61 (!) 163/69 (!) 130/52  Pulse: 81 74 66 65  Resp: '16 16 20 16  ' Temp: 98.5 F (36.9 C) 98.6 F (37 C) 98.6 F (  37 C) 98 F (36.7 C)  TempSrc: Oral Oral Oral Oral  SpO2: 97% 94% 98% 93%  Weight:  91 kg    Height:        Intake/Output Summary (Last 24 hours) at 05/31/2021 1002 Last data filed at 05/31/2021 0931 Gross per 24 hour  Intake 1150 ml  Output 400 ml  Net 750 ml   Filed Weights   05/28/21 2149 05/29/21 0435 05/30/21 0544  Weight: 90.7 kg 90.6 kg  91 kg    Examination:  General: No acute distress. Cardiovascular: RRR Lungs: unlabored Abdomen: Soft, nontender, nondistended  Neurological: Alert and oriented 3. Moves all extremities 4. Cranial nerves II through XII grossly intact. Skin: Warm and dry. No rashes or lesions. Extremities: No clubbing or cyanosis. No edema.     Data Reviewed: I have personally reviewed following labs and imaging studies  CBC: Recent Labs  Lab 05/28/21 2150 05/29/21 0512 05/30/21 0320 05/31/21 0315  WBC 8.0 7.9 5.3 4.7  NEUTROABS  --   --  3.1 2.7  HGB 15.2* 13.7 11.7* 10.9*  HCT 46.4* 42.2 37.3 34.6*  MCV 95.3 98.4 100.0 100.3*  PLT 364 312 257 416    Basic Metabolic Panel: Recent Labs  Lab 05/28/21 2150 05/29/21 0512 05/30/21 0320 05/31/21 0315  NA 140 141 144 137  K 3.3* 3.5 3.3* 3.5  CL 101 106 111 106  CO2 '23 27 25 25  ' GLUCOSE 205* 150* 114* 98  BUN '18 20 20 17  ' CREATININE 1.22* 1.28* 1.25* 1.26*  CALCIUM 10.1 9.2 8.7* 8.7*  MG  --  1.9 2.0 1.8  PHOS  --   --  3.5 3.1    GFR: Estimated Creatinine Clearance: 43 mL/min (Akiva Josey) (by C-G formula based on SCr of 1.26 mg/dL (H)).  Liver Function Tests: Recent Labs  Lab 05/28/21 2150 05/30/21 0320 05/31/21 0315  AST 14* 14* 14*  ALT '11 13 12  ' ALKPHOS 72 47 43  BILITOT 0.6 0.8 0.7  PROT 8.0 6.0* 6.0*  ALBUMIN 4.3 3.1* 3.1*    CBG: Recent Labs  Lab 05/30/21 1604 05/30/21 1936 05/30/21 2343 05/31/21 0358 05/31/21 0738  GLUCAP 122* 156* 112* 89 87     Recent Results (from the past 240 hour(s))  Resp Panel by RT-PCR (Flu Yon Schiffman&B, Covid) Nasopharyngeal Swab     Status: None   Collection Time: 05/28/21 11:49 PM   Specimen: Nasopharyngeal Swab; Nasopharyngeal(NP) swabs in vial transport medium  Result Value Ref Range Status   SARS Coronavirus 2 by RT PCR NEGATIVE NEGATIVE Final    Comment: (NOTE) SARS-CoV-2 target nucleic acids are NOT DETECTED.  The SARS-CoV-2 RNA is generally detectable in upper  respiratory specimens during the acute phase of infection. The lowest concentration of SARS-CoV-2 viral copies this assay can detect is 138 copies/mL. Hebe Merriwether negative result does not preclude SARS-Cov-2 infection and should not be used as the sole basis for treatment or other patient management decisions. Jairy Angulo negative result may occur with  improper specimen collection/handling, submission of specimen other than nasopharyngeal swab, presence of viral mutation(s) within the areas targeted by this assay, and inadequate number of viral copies(<138 copies/mL). Asencion Guisinger negative result must be combined with clinical observations, patient history, and epidemiological information. The expected result is Negative.  Fact Sheet for Patients:  EntrepreneurPulse.com.au  Fact Sheet for Healthcare Providers:  IncredibleEmployment.be  This test is no t yet approved or cleared by the [place] FDA and  has been authorized for detection and/or diagnosis of  SARS-CoV-2 by FDA under an Emergency Use Authorization (EUA). This EUA will remain  in effect (meaning this test can be used) for the duration of the COVID-19 declaration under Section 564(b)(1) of the Act, 21 U.S.C.section 360bbb-3(b)(1), unless the authorization is terminated  or revoked sooner.       Influenza Jarious Lyon by PCR NEGATIVE NEGATIVE Final   Influenza B by PCR NEGATIVE NEGATIVE Final    Comment: (NOTE) The Xpert Xpress SARS-CoV-2/FLU/RSV plus assay is intended as an aid in the diagnosis of influenza from Nasopharyngeal swab specimens and should not be used as Zaryia Markel sole basis for treatment. Nasal washings and aspirates are unacceptable for Xpert Xpress SARS-CoV-2/FLU/RSV testing.  Fact Sheet for Patients: EntrepreneurPulse.com.au  Fact Sheet for Healthcare Providers: IncredibleEmployment.be  This test is not yet approved or cleared by the [place] FDA and has been  authorized for detection and/or diagnosis of SARS-CoV-2 by FDA under an Emergency Use Authorization (EUA). This EUA will remain in effect (meaning this test can be used) for the duration of the COVID-19 declaration under Section 564(b)(1) of the Act, 21 U.S.C. section 360bbb-3(b)(1), unless the authorization is terminated or revoked.  Performed at [facility], [address], [place], [place] [address]          Radiology Studies: DG Abd Portable 1V-Small Bowel Obstruction Protocol-initial, 8 hr delay  Result Date: 05/29/2021 CLINICAL DATA:  Small bowel protocol EXAM: PORTABLE ABDOMEN - 1 VIEW COMPARISON:  CT 05/28/2021 FINDINGS: Esophageal tube tip and side port overlie the proximal stomach. Enteral contrast is present within the colon. There is contrast within the urinary bladder. There is some air distended small bowel in the left lower quadrant. IMPRESSION: Small bowel distension within the left lower quadrant. Enteral contrast is present in the colon. Electronically Signed   By: Donavan Foil M.D.   On: 05/29/2021 21:12        Scheduled Meds:  amLODipine  10 mg Oral Daily   enoxaparin (LOVENOX) injection  40 mg Subcutaneous Q24H   insulin aspart  0-9 Units Subcutaneous Q4H   Continuous Infusions:     LOS: 2 days    Time spent: over 30 min    Fayrene Helper, MD [facility]   To contact the attending provider between 7A-7P or the covering provider during after hours 7P-7A, please log into the web site [email or web address] and access using universal [place] password for that web site. If you do not have the password, please call the hospital operator.  05/31/2021, 10:02 AM

## 2021-06-01 LAB — CBC WITH DIFFERENTIAL/PLATELET
Abs Immature Granulocytes: 0.02 10*3/uL (ref 0.00–0.07)
Basophils Absolute: 0 10*3/uL (ref 0.0–0.1)
Basophils Relative: 1 %
Eosinophils Absolute: 0.3 10*3/uL (ref 0.0–0.5)
Eosinophils Relative: 7 %
HCT: 34.2 % — ABNORMAL LOW (ref 36.0–46.0)
Hemoglobin: 11.1 g/dL — ABNORMAL LOW (ref 12.0–15.0)
Immature Granulocytes: 1 %
Lymphocytes Relative: 25 %
Lymphs Abs: 1.1 10*3/uL (ref 0.7–4.0)
MCH: 32 pg (ref 26.0–34.0)
MCHC: 32.5 g/dL (ref 30.0–36.0)
MCV: 98.6 fL (ref 80.0–100.0)
Monocytes Absolute: 0.6 10*3/uL (ref 0.1–1.0)
Monocytes Relative: 15 %
Neutro Abs: 2.2 10*3/uL (ref 1.7–7.7)
Neutrophils Relative %: 51 %
Platelets: 203 10*3/uL (ref 150–400)
RBC: 3.47 MIL/uL — ABNORMAL LOW (ref 3.87–5.11)
RDW: 15.1 % (ref 11.5–15.5)
WBC: 4.2 10*3/uL (ref 4.0–10.5)
nRBC: 0 % (ref 0.0–0.2)

## 2021-06-01 LAB — COMPREHENSIVE METABOLIC PANEL
ALT: 11 U/L (ref 0–44)
AST: 13 U/L — ABNORMAL LOW (ref 15–41)
Albumin: 3.1 g/dL — ABNORMAL LOW (ref 3.5–5.0)
Alkaline Phosphatase: 44 U/L (ref 38–126)
Anion gap: 7 (ref 5–15)
BUN: 14 mg/dL (ref 8–23)
CO2: 24 mmol/L (ref 22–32)
Calcium: 8.8 mg/dL — ABNORMAL LOW (ref 8.9–10.3)
Chloride: 104 mmol/L (ref 98–111)
Creatinine, Ser: 0.99 mg/dL (ref 0.44–1.00)
GFR, Estimated: >60 mL/min (ref >60)
Glucose, Bld: 96 mg/dL (ref 70–99)
Potassium: 3.2 mmol/L — ABNORMAL LOW (ref 3.5–5.1)
Sodium: 135 mmol/L (ref 135–145)
Total Bilirubin: 0.7 mg/dL (ref 0.3–1.2)
Total Protein: 6 g/dL — ABNORMAL LOW (ref 6.5–8.1)

## 2021-06-01 LAB — PHOSPHORUS: Phosphorus: 3.4 mg/dL (ref 2.5–4.6)

## 2021-06-01 LAB — GLUCOSE, CAPILLARY
Glucose-Capillary: 102 mg/dL — ABNORMAL HIGH (ref 70–99)
Glucose-Capillary: 110 mg/dL — ABNORMAL HIGH (ref 70–99)
Glucose-Capillary: 132 mg/dL — ABNORMAL HIGH (ref 70–99)
Glucose-Capillary: 79 mg/dL (ref 70–99)
Glucose-Capillary: 91 mg/dL (ref 70–99)
Glucose-Capillary: 93 mg/dL (ref 70–99)

## 2021-06-01 LAB — MAGNESIUM: Magnesium: 1.8 mg/dL (ref 1.7–2.4)

## 2021-06-01 MED ORDER — POLYETHYLENE GLYCOL 3350 17 G PO PACK
17.0000 g | PACK | Freq: Two times a day (BID) | ORAL | Status: DC
  Administered 2021-06-01 – 2021-06-02 (×3): 17 g via ORAL
  Filled 2021-06-01 (×3): qty 1

## 2021-06-01 MED ORDER — POTASSIUM CHLORIDE 2 MEQ/ML IV SOLN
INTRAVENOUS | Status: DC
  Filled 2021-06-01 (×6): qty 1000

## 2021-06-01 MED ORDER — POTASSIUM CHLORIDE 10 MEQ/100ML IV SOLN
10.0000 meq | INTRAVENOUS | Status: DC
  Administered 2021-06-01: 10 meq via INTRAVENOUS
  Filled 2021-06-01: qty 100

## 2021-06-01 MED ORDER — POTASSIUM CHLORIDE 10 MEQ/100ML IV SOLN: 10.0000 meq | INTRAVENOUS | Status: DC

## 2021-06-01 MED ORDER — POTASSIUM CHLORIDE CRYS ER 20 MEQ PO TBCR
40.0000 meq | EXTENDED_RELEASE_TABLET | ORAL | Status: AC
  Administered 2021-06-01 (×2): 40 meq via ORAL
  Filled 2021-06-01 (×2): qty 2

## 2021-06-01 MED ORDER — INSULIN ASPART 100 UNIT/ML IJ SOLN: 0.0000 [IU] | Freq: Every day | INTRAMUSCULAR | Status: DC

## 2021-06-01 MED ORDER — INSULIN ASPART 100 UNIT/ML IJ SOLN
0.0000 [IU] | Freq: Three times a day (TID) | INTRAMUSCULAR | Status: DC
  Administered 2021-06-02 (×2): 1 [IU] via SUBCUTANEOUS

## 2021-06-01 NOTE — Plan of Care (Signed)
Plan of care reviewed and discussed with the patient. 

## 2021-06-01 NOTE — Progress Notes (Addendum)
PROGRESS NOTE    Christina [name]  YTK:[id number] DOB: 06-16-1950 DOA: 05/28/2021 PCP: [name], MD  Chief Complaint  Patient presents with   Abdominal Pain    Brief Narrative:   Christina [name] is Christina [name] pleasant 71 y.o. female with medical history significant for hypertension, type 2 diabetes mellitus, and CKD stage IIIa, now presenting to the emergency department for evaluation of nominal pain with nausea, vomiting, and diarrhea.  Patient reports that she had been in her usual state of health until 05/27/2021 when she developed generalized abdominal pain, 2 episodes of nonbloody diarrhea, and severe nausea with too numerous to count episodes of vomiting.  She is not aware of any sick contacts.  She had similar symptoms in September 2020 that required hospitalization and resolved with conservative management.  She reports history of colon cancer that was treated with resection of 12 inches of colon approximately 15 to 20 years ago.  She reports Christina [name] fever to 101.1 F yesterday.   [facility] ED Course: Upon arrival to the ED, patient is found to be afebrile, saturating well on room air, and with stable blood pressure.  EKG features sinus rhythm and chest x-ray negative for acute cardiopulmonary disease.  Chemistry panel notable for potassium 3.3, glucose 205, and creatinine 1.22.  CBC with mild polycythemia.  Lactic acid was 2.2.  COVID and influenza PCR negative.  CT of the abdomen and pelvis demonstrates thick-walled, dilated, and fluid-filled loops of small bowel concerning for infectious or inflammatory enteritis with [name] degree of SBO.  NG tube was placed in the ED, 1 L of saline and Zofran were administered, and the patient was transferred to [facility] for [facility].    Assessment & Plan:   Principal Problem:   SBO (small bowel obstruction) (HCC) Active Problems:   Acute gastroenteritis   Type 2 diabetes mellitus with stage 3 chronic kidney disease (HCC)   Hypertension   Stage 3a chronic  kidney disease (CKD) (HCC)   Hypokalemia   * SBO (small bowel obstruction) ([name])- (present on admission) CT concerning for enteritis with degree of SBO (infectious vs inflammatory enteritis) NG tube out 1/27, no nausea/vomiting - monitor KUB with dilation of small bowel loops, possible interval worsening - clinically looks improved Currently afebrile without WBC count, will hold off on abx Appreciate surgery assistance -> full liquid diet, will follow  Continuing to have flatus, no BM yet Will send inflammatory markers - ESR 45, CRP wnl Fecal lactoferrin, if appropriate GI apth panel and c diff Recurrent since 2020, GI outpatient follow up is reasonable  Type 2 diabetes mellitus with stage 3 chronic kidney disease ([name])- (present on admission) A1c 6.1 in Nov 2022 Continue SSI  Hypertension- (present on admission) Continue to monitor Start amlodipine, continue to hold benazepril  Stage 3a chronic kidney disease (CKD) ([name])- (present on admission) Will follow, appears close to baseline  Hypokalemia Replace and follow   DVT prophylaxis: lovenox Code Status: full Family Communication: none Disposition:   Status is: Inpatient  Remains inpatient appropriate because: SBO, pending resolution       Consultants:  surgery  Procedures:  none  Antimicrobials:  Anti-infectives (From admission, onward)    None       Subjective: Passing gas No BM yet Feeling better  Objective: Vitals:   05/31/21 1957 06/01/21 0413 06/01/21 0429 06/01/21 1400  BP: 138/61 130/65  139/65  Pulse: 73 (!) 57  72  Resp: '18 18  18  ' Temp: 98.6 F (37 C)  98.6 F (37 C)  99.3 F (37.4 C)  TempSrc:    Oral  SpO2: 94% 97%  100%  Weight:   91.4 kg   Height:        Intake/Output Summary (Last 24 hours) at 06/01/2021 1724 Last data filed at 06/01/2021 1159 Gross per 24 hour  Intake 480 ml  Output 0 ml  Net 480 ml   Filed Weights   05/29/21 0435 05/30/21 0544 06/01/21 0429  Weight:  90.6 kg 91 kg 91.4 kg    Examination:  General: No acute distress. Cardiovascular: RRR Lungs: unlabored Abdomen: Soft, nontender, nondistended  Neurological: Alert and oriented 3. Moves all extremities 4 w. Cranial nerves II through XII grossly intact. Skin: Warm and dry. No rashes or lesions. Extremities: No clubbing or cyanosis. No edema.     Data Reviewed: I have personally reviewed following labs and imaging studies  CBC: Recent Labs  Lab 05/28/21 2150 05/29/21 0512 05/30/21 0320 05/31/21 0315 06/01/21 0304  WBC 8.0 7.9 5.3 4.7 4.2  NEUTROABS  --   --  3.1 2.7 2.2  HGB 15.2* 13.7 11.7* 10.9* 11.1*  HCT 46.4* 42.2 37.3 34.6* 34.2*  MCV 95.3 98.4 100.0 100.3* 98.6  PLT 364 312 257 225 720    Basic Metabolic Panel: Recent Labs  Lab 05/28/21 2150 05/29/21 0512 05/30/21 0320 05/31/21 0315 06/01/21 0304  NA 140 141 144 137 135  K 3.3* 3.5 3.3* 3.5 3.2*  CL 101 106 111 106 104  CO2 '23 27 25 25 24  ' GLUCOSE 205* 150* 114* 98 96  BUN '18 20 20 17 14  ' CREATININE 1.22* 1.28* 1.25* 1.26* 0.99  CALCIUM 10.1 9.2 8.7* 8.7* 8.8*  MG  --  1.9 2.0 1.8 1.8  PHOS  --   --  3.5 3.1 3.4    GFR: Estimated Creatinine Clearance: 54.8 mL/min (by C-G formula based on SCr of 0.99 mg/dL).  Liver Function Tests: Recent Labs  Lab 05/28/21 2150 05/30/21 0320 05/31/21 0315 06/01/21 0304  AST 14* 14* 14* 13*  ALT '11 13 12 11  ' ALKPHOS 72 47 43 44  BILITOT 0.6 0.8 0.7 0.7  PROT 8.0 6.0* 6.0* 6.0*  ALBUMIN 4.3 3.1* 3.1* 3.1*    CBG: Recent Labs  Lab 06/01/21 0004 06/01/21 0414 06/01/21 0824 06/01/21 1120 06/01/21 1636  GLUCAP 93 91 79 132* 110*     Recent Results (from the past 240 hour(s))  Resp Panel by RT-PCR (Flu Christina [name]&B, Covid) Nasopharyngeal Swab     Status: None   Collection Time: 05/28/21 11:49 PM   Specimen: Nasopharyngeal Swab; Nasopharyngeal(NP) swabs in vial transport medium  Result Value Ref Range Status   SARS Coronavirus 2 by RT PCR NEGATIVE NEGATIVE  Final    Comment: (NOTE) SARS-CoV-2 target nucleic acids are NOT DETECTED.  The SARS-CoV-2 RNA is generally detectable in upper respiratory specimens during the acute phase of infection. The lowest concentration of SARS-CoV-2 viral copies this assay can detect is 138 copies/mL. Christina [name] negative result does not preclude SARS-Cov-2 infection and should not be used as the sole basis for treatment or other patient management decisions. Christina [name] negative result may occur with  improper specimen collection/handling, submission of specimen other than nasopharyngeal swab, presence of viral mutation(s) within the areas targeted by this assay, and inadequate number of viral copies(<138 copies/mL). Christina [name] negative result must be combined with clinical observations, patient history, and epidemiological information. The expected result is Negative.  Fact Sheet for Patients:  EntrepreneurPulse.com.au  Fact Sheet  for Healthcare Providers:  IncredibleEmployment.be  This test is no t yet approved or cleared by the [place] and  has been authorized for detection and/or diagnosis of SARS-CoV-2 by FDA under an Emergency Use Authorization (EUA). This EUA will remain  in effect (meaning this test can be used) for the duration of the COVID-19 declaration under Section 564(b)(1) of the Act, 21 U.S.C.section 360bbb-3(b)(1), unless the authorization is terminated  or revoked sooner.       Influenza Charle Clear by PCR NEGATIVE NEGATIVE Final   Influenza B by PCR NEGATIVE NEGATIVE Final    Comment: (NOTE) The Xpert Xpress SARS-CoV-2/FLU/RSV plus assay is intended as an aid in the diagnosis of influenza from Nasopharyngeal swab specimens and should not be used as Talyssa Gibas sole basis for treatment. Nasal washings and aspirates are unacceptable for Xpert Xpress SARS-CoV-2/FLU/RSV testing.  Fact Sheet for Patients: EntrepreneurPulse.com.au  Fact Sheet for Healthcare  Providers: IncredibleEmployment.be  This test is not yet approved or cleared by the [place] FDA and has been authorized for detection and/or diagnosis of SARS-CoV-2 by FDA under an Emergency Use Authorization (EUA). This EUA will remain in effect (meaning this test can be used) for the duration of the COVID-19 declaration under Section 564(b)(1) of the Act, 21 U.S.C. section 360bbb-3(b)(1), unless the authorization is terminated or revoked.  Performed at [facility], [address] [place], [place] [address]          Radiology Studies: DG Abd 1 View  Result Date: 05/31/2021 CLINICAL DATA:  Abdominal pain and distention EXAM: ABDOMEN - 1 VIEW COMPARISON:  Previous studies including the examination done on 05/29/2021 FINDINGS: There is dilation of small-bowel loops measuring up to 4.8 cm in diameter. There is possible interval worsening of small bowel dilation. Enteric contrast has reached the colon. There is interval removal of NG tube. IMPRESSION: There is dilation of small-bowel loops with possible interval worsening suggesting partial small bowel obstruction. Electronically Signed   By: Elmer Picker M.D.   On: 05/31/2021 12:38        Scheduled Meds:  amLODipine  10 mg Oral Daily   enoxaparin (LOVENOX) injection  40 mg Subcutaneous Q24H   insulin aspart  0-9 Units Subcutaneous Q4H   polyethylene glycol  17 g Oral BID   Continuous Infusions:  lactated ringers with kcl 75 mL/hr at 06/01/21 0924      LOS: 3 days    Time spent: over 30 min    Fayrene Helper, MD [facility]   To contact the attending provider between 7A-7P or the covering provider during after hours 7P-7A, please log into the web site [email or web address] and access using universal [place] password for that web site. If you do not have the password, please call the hospital operator.  06/01/2021, 5:24 PM

## 2021-06-01 NOTE — Progress Notes (Signed)
° °  Subjective/Chief Complaint: Reports pain is less No nausea this morning. She is passing flatus She did not get advanced to full yesterday   Objective: Vital signs in last 24 hours: Temp:  [98.6 F (37 C)-99.3 F (37.4 C)] 98.6 F (37 C) (01/29 0413) Pulse Rate:  [57-73] 57 (01/29 0413) Resp:  [16-18] 18 (01/29 0413) BP: (130-143)/(57-65) 130/65 (01/29 0413) SpO2:  [94 %-97 %] 97 % (01/29 0413) Weight:  [91.4 kg] 91.4 kg (01/29 0429) Last BM Date: 05/28/21  Intake/Output from previous day: 01/28 0701 - 01/29 0700 In: 800 [P.O.:800] Out: 400 [Urine:400] Intake/Output this shift: No intake/output data recorded.  Exam: Awake and alert Abdomen is softer and minimally tender, hernia stable  Lab Results:  Recent Labs    05/31/21 0315 06/01/21 0304  WBC 4.7 4.2  HGB 10.9* 11.1*  HCT 34.6* 34.2*  PLT 225 203   BMET Recent Labs    05/31/21 0315 06/01/21 0304  NA 137 135  K 3.5 3.2*  CL 106 104  CO2 25 24  GLUCOSE 98 96  BUN 17 14  CREATININE 1.26* 0.99  CALCIUM 8.7* 8.8*   PT/INR No results for input(s): LABPROT, INR in the last 72 hours. ABG No results for input(s): PHART, HCO3 in the last 72 hours.  Invalid input(s): PCO2, PO2  Studies/Results: DG Abd 1 View  Result Date: 05/31/2021 CLINICAL DATA:  Abdominal pain and distention EXAM: ABDOMEN - 1 VIEW COMPARISON:  Previous studies including the examination done on 05/29/2021 FINDINGS: There is dilation of small-bowel loops measuring up to 4.8 cm in diameter. There is possible interval worsening of small bowel dilation. Enteric contrast has reached the colon. There is interval removal of NG tube. IMPRESSION: There is dilation of small-bowel loops with possible interval worsening suggesting partial small bowel obstruction. Electronically Signed   By: Elmer Picker M.D.   On: 05/31/2021 12:38    Anti-infectives: Anti-infectives (From admission, onward)    None       Assessment/Plan: Small  bowel obstruction likely secondary to enteritis  Full liquid diet the day Continuing current care She needs to at least have a bowel movement and tolerating a soft diet prior to discharge  Straightforward decision making  Coralie Keens MD 06/01/2021

## 2021-06-02 DIAGNOSIS — I722 Aneurysm of renal artery: Secondary | ICD-10-CM

## 2021-06-02 DIAGNOSIS — K439 Ventral hernia without obstruction or gangrene: Secondary | ICD-10-CM

## 2021-06-02 LAB — COMPREHENSIVE METABOLIC PANEL
ALT: 12 U/L (ref 0–44)
AST: 14 U/L — ABNORMAL LOW (ref 15–41)
Albumin: 3.2 g/dL — ABNORMAL LOW (ref 3.5–5.0)
Alkaline Phosphatase: 45 U/L (ref 38–126)
Anion gap: 9 (ref 5–15)
BUN: 11 mg/dL (ref 8–23)
CO2: 24 mmol/L (ref 22–32)
Calcium: 9 mg/dL (ref 8.9–10.3)
Chloride: 104 mmol/L (ref 98–111)
Creatinine, Ser: 1.06 mg/dL — ABNORMAL HIGH (ref 0.44–1.00)
GFR, Estimated: 56 mL/min — ABNORMAL LOW (ref >60)
Glucose, Bld: 108 mg/dL — ABNORMAL HIGH (ref 70–99)
Potassium: 3.7 mmol/L (ref 3.5–5.1)
Sodium: 137 mmol/L (ref 135–145)
Total Bilirubin: 0.8 mg/dL (ref 0.3–1.2)
Total Protein: 6.1 g/dL — ABNORMAL LOW (ref 6.5–8.1)

## 2021-06-02 LAB — CBC WITH DIFFERENTIAL/PLATELET
Abs Immature Granulocytes: 0.01 10*3/uL (ref 0.00–0.07)
Basophils Absolute: 0 10*3/uL (ref 0.0–0.1)
Basophils Relative: 1 %
Eosinophils Absolute: 0.2 10*3/uL (ref 0.0–0.5)
Eosinophils Relative: 6 %
HCT: 35.4 % — ABNORMAL LOW (ref 36.0–46.0)
Hemoglobin: 11.8 g/dL — ABNORMAL LOW (ref 12.0–15.0)
Immature Granulocytes: 0 %
Lymphocytes Relative: 24 %
Lymphs Abs: 1 10*3/uL (ref 0.7–4.0)
MCH: 32.4 pg (ref 26.0–34.0)
MCHC: 33.3 g/dL (ref 30.0–36.0)
MCV: 97.3 fL (ref 80.0–100.0)
Monocytes Absolute: 0.6 10*3/uL (ref 0.1–1.0)
Monocytes Relative: 14 %
Neutro Abs: 2.4 10*3/uL (ref 1.7–7.7)
Neutrophils Relative %: 55 %
Platelets: 228 10*3/uL (ref 150–400)
RBC: 3.64 MIL/uL — ABNORMAL LOW (ref 3.87–5.11)
RDW: 14.8 % (ref 11.5–15.5)
WBC: 4.3 10*3/uL (ref 4.0–10.5)
nRBC: 0 % (ref 0.0–0.2)

## 2021-06-02 LAB — PHOSPHORUS: Phosphorus: 3.2 mg/dL (ref 2.5–4.6)

## 2021-06-02 LAB — GLUCOSE, CAPILLARY
Glucose-Capillary: 84 mg/dL (ref 70–99)
Glucose-Capillary: 124 mg/dL — ABNORMAL HIGH (ref 70–99)
Glucose-Capillary: 133 mg/dL — ABNORMAL HIGH (ref 70–99)

## 2021-06-02 LAB — MAGNESIUM: Magnesium: 1.9 mg/dL (ref 1.7–2.4)

## 2021-06-02 MED ORDER — ONDANSETRON HCL 4 MG PO TABS: 4.0000 mg | ORAL_TABLET | Freq: Three times a day (TID) | ORAL | 0 refills | Status: AC | PRN

## 2021-06-02 NOTE — Progress Notes (Addendum)
Subjective: CC: Patient reports that her abdomen does not feel distended or bloated. It feels back to baseline. Some occasional pain in her upper abdomen that is mild. Controlled with Tylenol. Tolerating fld (had grits for breakfast) without n/v. Passing flatus. Soft bm yesterday and again this morning just before I saw her.   Reports she has had a palpable hernia of her lower abdomen for ~ 3 years. No change in this.  Objective: Vital signs in last 24 hours: Temp:  [98 F (36.7 C)-99.3 F (37.4 C)] 98 F (36.7 C) (01/30 0531) Pulse Rate:  [68-72] 69 (01/30 0531) Resp:  [18] 18 (01/30 0531) BP: (139-153)/(65-77) 149/69 (01/30 0531) SpO2:  [98 %-100 %] 100 % (01/30 0531) Last BM Date: 06/01/21  Intake/Output from previous day: 01/29 0701 - 01/30 0700 In: 2076.4 [P.O.:1080; I.V.:996.4] Out: -  Intake/Output this shift: Total I/O In: 225 [P.O.:100; I.V.:125] Out: -   PE: Gen:  Alert, NAD, pleasant. Patient sitting up in chair Pulm: rate and normal Abd: Obese, ?some mild distension but she reports this is her baseline abdomen. NT. +BS. ventral hernia palpable and NT.  Psych: A&Ox3  Skin: no rashes noted, warm and dry  Lab Results:  Recent Labs    06/01/21 0304 06/02/21 0320  WBC 4.2 4.3  HGB 11.1* 11.8*  HCT 34.2* 35.4*  PLT 203 228   BMET Recent Labs    06/01/21 0304 06/02/21 0320  NA 135 137  K 3.2* 3.7  CL 104 104  CO2 24 24  GLUCOSE 96 108*  BUN 14 11  CREATININE 0.99 1.06*  CALCIUM 8.8* 9.0   PT/INR No results for input(s): LABPROT, INR in the last 72 hours. CMP     Component Value Date/Time   NA 137 06/02/2021 0320   K 3.7 06/02/2021 0320   CL 104 06/02/2021 0320   CO2 24 06/02/2021 0320   GLUCOSE 108 (H) 06/02/2021 0320   BUN 11 06/02/2021 0320   CREATININE 1.06 (H) 06/02/2021 0320   CALCIUM 9.0 06/02/2021 0320   PROT 6.1 (L) 06/02/2021 0320   ALBUMIN 3.2 (L) 06/02/2021 0320   AST 14 (L) 06/02/2021 0320   ALT 12 06/02/2021 0320    ALKPHOS 45 06/02/2021 0320   BILITOT 0.8 06/02/2021 0320   GFRNONAA 56 (L) 06/02/2021 0320   GFRAA 52 (L) 01/14/2019 0331   Lipase     Component Value Date/Time   LIPASE <10 (L) 05/28/2021 2150    Studies/Results: DG Abd 1 View  Result Date: 05/31/2021 CLINICAL DATA:  Abdominal pain and distention EXAM: ABDOMEN - 1 VIEW COMPARISON:  Previous studies including the examination done on 05/29/2021 FINDINGS: There is dilation of small-bowel loops measuring up to 4.8 cm in diameter. There is possible interval worsening of small bowel dilation. Enteric contrast has reached the colon. There is interval removal of NG tube. IMPRESSION: There is dilation of small-bowel loops with possible interval worsening suggesting partial small bowel obstruction. Electronically Signed   By: Elmer Picker M.D.   On: 05/31/2021 12:38    Anti-infectives: Anti-infectives (From admission, onward)    None        Assessment/Plan SBO likely secondary to enteritis - CT 1/25 showed SBO likely 2/2 enteritis as noted above.  - Xray 1/28 showed contrast in her colon - Tolerating FLD and having bowel function.   - Adv to soft diet. If tolerates, can be d/c'd from our standpoint. Will let [facility] know.    FEN -  Soft diet  VTE - SCDs, Lovenox ID - None indicated from a general surgery standpoint. WBC remains wnl. Afebrile.   Ventral hernia - This does not appear to be the point of obstruction on CT. NT on exam. Having bowel function. Please see my colleagues note from 1/27 about discussion with patient. She can follow up in the office to discuss elective repair if she chooses. Will discuss with Dr. Thermon Leyland and have him review her scan to see if he would like to see her back in the office.  H/O colon cancer HTN DM2  This care required moderate level of medical decision making.    LOS: 4 days    Jillyn Ledger , [facility] Surgery 06/02/2021, 9:46 AM Please see Amion for pager number during  day hours 7:00am-4:30pm

## 2021-06-02 NOTE — Discharge Summary (Addendum)
Physician Discharge Summary  Christina [name] FHQ:[id number] DOB: 1950-06-10 DOA: 05/28/2021  PCP: [name], MD  Admit date: 05/28/2021 Discharge date: 06/02/2021  Time spent: 40 minutes  Recommendations for Outpatient Follow-up:  Follow outpatient CBC/CMP Would recommend GI follow up given recurrent enteritis with SBO Follow up with general surgery for ventral hernia Follow renal artery aneurysm outpatient   Discharge Diagnoses:  Principal Problem:   SBO (small bowel obstruction) ([place]) Active Problems:   Acute gastroenteritis   Type 2 diabetes mellitus with stage 3 chronic kidney disease ([place])   Hypertension   Stage 3a chronic kidney disease (CKD) (Christina [name])   Hypokalemia   Ventral hernia   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 female with medical history significant for hypertension, type 2 diabetes mellitus, and CKD stage IIIa, with hx colon cancer s/p resection of 12 inches of colon 15-20 years ago now presenting to the emergency department for evaluation of nominal pain with nausea, vomiting, and diarrhea.  Patient reports that she had been in her usual state of health until 05/27/2021 when she developed generalized abdominal pain, 2 episodes of nonbloody diarrhea, and severe nausea with too numerous to count episodes of vomiting.  She is not aware of any sick contacts.  She had similar symptoms in September 2020 that required hospitalization and resolved with conservative management.  She reports Christina [name] fever to 101.1 F yesterday.  She had CT findings with enteritis with SBO.  General surgery was consulted.  She's improved with conservative management.  Plan for outpatient follow up.   See below for additional details  Hospital Course:  Assessment and Plan: * SBO (small  bowel obstruction) ([place])- (present on admission) CT concerning for enteritis with degree of SBO (infectious vs inflammatory enteritis) NG tube out 1/27, no nausea/vomiting - monitor KUB with dilation of small bowel loops, possible interval worsening  Currently afebrile without WBC count, will hold off on abx Appreciate surgery assistance -> she's tolerated Christina [name] soft diet today, plan is for discharge today.  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 Has had BM and passing flatus Will send inflammatory markers - ESR 45, CRP wnl Recurrent since 2020, GI outpatient follow up is reasonable  Type 2 diabetes mellitus with stage 3 chronic kidney disease (Christina [name])- (present on admission) A1c 6.1 in Nov 2022 Continue SSI  Hypertension- (present on admission) Resume home meds  Stage 3a chronic kidney disease (CKD) (Christina [name])- (present on admission) Will follow, appears close to baseline  Hypokalemia Replace and follow  Ventral hernia Follow outpatient with surgery  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 Cardiovascular: RRR Lungs: unlabored Abdomen: Soft, nontender, nondistended Neurological: Alert and oriented 3. Moves all extremities 4 . Cranial nerves II through XII grossly intact. Skin: Warm and dry. No rashes or lesions. Extremities: No clubbing or cyanosis. No edema  Discharge Instructions   Discharge Instructions  Ambulatory referral to Gastroenterology   Complete by: As directed    Ambulatory referral to Physical Therapy   Complete by: As directed    Call MD for:  difficulty breathing, headache or visual  disturbances   Complete by: As directed    Call MD for:  extreme fatigue   Complete by: As directed    Call MD for:  hives   Complete by: As directed    Call MD for:  persistant dizziness or light-headedness   Complete by: As directed    Call MD for:  persistant nausea and vomiting   Complete by: As directed    Call MD for:  redness, tenderness, or signs of infection (pain, swelling, redness, odor or green/yellow discharge around incision site)   Complete by: As directed    Call MD for:  severe uncontrolled pain   Complete by: As directed    Call MD for:  temperature >100.4   Complete by: As directed    Diet - low sodium heart healthy   Complete by: As directed    Discharge instructions   Complete by: As directed    You were seen for Christina [name] small bowel obstruction.  This has improved with conservative management.  We'll send you home to follow outpatient with gastroenterology.  Follow up with general surgery as needed.  Follow with general surgery for your hernia as an outpatient.  You have Christina Tootle stable renal artery aneurysm that you should follow with your PCP regarding follow up for this.  Return for new, recurrent, or worsening symptoms.  Please ask your PCP to request records from this hospitalization so they know what was done and what the next steps will be.   Increase activity slowly   Complete by: As directed       Allergies as of 06/02/2021       Reactions   Atorvastatin    Other reaction(s): Cramps (ALLERGY/intolerance), Other Other reaction(s): muscle pain   Penicillins Dermatitis, Rash   nails peels Tolerated Cephalosporin Date: 04/01/21. Other reaction(s): rash/ skin peel/ and nails come off   Rosuvastatin    Other reaction(s): Cramps & muscle pain Other reaction(s): muscle pain   Sulfa Antibiotics Nausea And Vomiting   Other reaction(s): Muscle Cramps   Cholestyramine Diarrhea   Other reaction(s): diarrhea   Gemfibrozil Diarrhea, Nausea And Vomiting   Other  reaction(s): vomiting and diarrhea   Pravastatin    Other reaction(s): muscle pain        Medication List     STOP taking these medications    doxycycline 100 MG capsule Commonly known as: VIBRAMYCIN   gabapentin 300 MG capsule Commonly known as: NEURONTIN       TAKE these medications    Accu-Chek Aviva Plus test strip Generic drug: glucose blood   Accu-Chek Guide Control Liqd See admin instructions.   Accu-Chek Guide w/Device Kit See admin instructions.   Accu-Chek Softclix Lancets lancets   acetaminophen 500 MG tablet Commonly known as: TYLENOL Take 1,000 mg by mouth every 6 (six) hours as needed for mild pain.   amLODipine-benazepril 10-40 MG capsule Commonly known as: LOTREL Take 1 capsule by mouth daily.   APPLE CIDER VINEGAR PO Take 1 tablet by mouth daily. [place] Gummy   B-D SINGLE USE SWABS REGULAR Pads USE TO TEST BLOOD SUGAR ONCE Christina [name] DAY   hydrocortisone 2.5 % rectal cream Commonly known as: ANUSOL-HC Apply to hemorrhoids daily.   IMMUNE FORMULA PO Take 1 tablet by mouth daily.  Lidocaine (Anorectal) 5 % Crea Commonly known as: RectiCare Apply to hemorrhoids as needed for pain. What changed:  how much to take how to take this when to take this reasons to take this   metFORMIN 500 MG 24 hr tablet Commonly known as: GLUCOPHAGE-XR Take 1,000 mg by mouth daily with breakfast.   ondansetron 4 MG tablet Commonly known as: Zofran Take 1 tablet (4 mg total) by mouth every 8 (eight) hours as needed for up to 5 days for nausea or vomiting.   Vibegron 75 MG Tabs Take 75 mg by mouth daily.       Allergies  Allergen Reactions   Atorvastatin     Other reaction(s): Cramps (ALLERGY/intolerance), Other Other reaction(s): muscle pain   Penicillins Dermatitis and Rash    nails peels Tolerated Cephalosporin Date: 04/01/21.   Other reaction(s): rash/ skin peel/ and nails come off   Rosuvastatin     Other reaction(s): Cramps & muscle  pain Other reaction(s): muscle pain   Sulfa Antibiotics Nausea And Vomiting    Other reaction(s): Muscle Cramps   Cholestyramine Diarrhea    Other reaction(s): diarrhea   Gemfibrozil Diarrhea and Nausea And Vomiting    Other reaction(s): vomiting and diarrhea   Pravastatin     Other reaction(s): muscle pain    Follow-up Information     Stechschulte, Nickola Major, MD Follow up on 06/13/2021.   Specialty: Surgery Why: 150pm. This appointment is to discuss possible elective repair of your hernia. Please arrive 30 minutes prior to your appointment for paperwork. Please bring Bliss Tsang copy of your photo ID and insurance card. Contact information: [place]. [address] [place] [address] [phone]                  The results of significant diagnostics from this hospitalization (including imaging, microbiology, ancillary and laboratory) are listed below for reference.    Significant Diagnostic Studies: DG Chest 2 View  Result Date: 05/28/2021 CLINICAL DATA:  Infection. EXAM: CHEST - 2 VIEW COMPARISON:  Chest x-ray 07/24/2013. FINDINGS: The heart size and mediastinal contours are within normal limits. Both lungs are clear. The visualized skeletal structures are unremarkable. IMPRESSION: No active cardiopulmonary disease. Electronically Signed   By: Ronney Asters M.D.   On: 05/28/2021 22:24   DG Abd 1 View  Result Date: 05/31/2021 CLINICAL DATA:  Abdominal pain and distention EXAM: ABDOMEN - 1 VIEW COMPARISON:  Previous studies including the examination done on 05/29/2021 FINDINGS: There is dilation of small-bowel loops measuring up to 4.8 cm in diameter. There is possible interval worsening of small bowel dilation. Enteric contrast has reached the colon. There is interval removal of NG tube. IMPRESSION: There is dilation of small-bowel loops with possible interval worsening suggesting partial small bowel obstruction. Electronically Signed   By: Elmer Picker M.D.   On: 05/31/2021  12:38   DG Abdomen 1 View  Result Date: 05/29/2021 CLINICAL DATA:  Tube placement EXAM: ABDOMEN - 1 VIEW COMPARISON:  None. FINDINGS: NG tube is in place with the tip in the stomach. Paucity of gas in the abdomen. IMPRESSION: NG tube in the stomach. Electronically Signed   By: Rolm Baptise M.D.   On: 05/29/2021 01:11   CT ABDOMEN PELVIS W CONTRAST  Result Date: 05/28/2021 CLINICAL DATA:  Upper abdominal pain EXAM: CT ABDOMEN AND PELVIS WITH CONTRAST TECHNIQUE: Multidetector CT imaging of the abdomen and pelvis was performed using the standard protocol following bolus administration of intravenous contrast. RADIATION DOSE REDUCTION:  This exam was performed according to the departmental dose-optimization program which includes automated exposure control, adjustment of the mA and/or kV according to patient size and/or use of iterative reconstruction technique. CONTRAST:  155m OMNIPAQUE IOHEXOL 300 MG/ML  SOLN COMPARISON:  01/12/2019 FINDINGS: Lower chest: No acute abnormality. Small to moderate-sized hiatal hernia. Hepatobiliary: No focal hepatic abnormality. Gallbladder unremarkable. Pancreas: No focal abnormality or ductal dilatation. Spleen: No focal abnormality.  Normal size. Adrenals/Urinary Tract: Adrenal glands unremarkable. 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 Decreasing size of right upper pole cyst, now measuring 1.6 cm compared to 3.7 cm previously. No hydronephrosis. Urinary bladder unremarkable. Stomach/Bowel: Postoperative changes in the right colon. There are multiple thick walled small bowel loops seen in the central and left abdomen. These loops are also mildly dilated and fluid-filled. Distal small bowel loops are decompressed and unremarkable. Vascular/Lymphatic: Aortic atherosclerosis. No evidence of aneurysm or adenopathy. Reproductive: Uterus and adnexa unremarkable.  No mass. Other: There is Christina [name] midline ventral hernia noted containing fat and Christina [name]  small bowel loop. This does not appear to be the cause of the dilated small bowel. There is fluid also within the hernia sac. Fluid adjacent to the dilated, thick walled left abdominal small bowel loops as well as adjacent to the liver. No free air. Musculoskeletal: No acute bony abnormality. IMPRESSION: Thick walled, dilated small bowel loops in the mid to left abdomen. Proximal to these thickened small bowel loops, jejunal small bowel loops are mildly dilated and fluid-filled. Findings are concerning for enteritis with Nashonda Limberg degree of small-bowel obstruction. This most likely reflects infectious or inflammatory enteritis. Mesenteric vessels are patent making ischemic enteritis less likely. Anterior lower abdominal wall ventral hernia containing fat, fluid and Kadeshia Kasparian small bowel loop. This does not appear to be the cause of the small bowel obstruction. Free fluid adjacent to the dilated thick-walled small bowel loops as well as adjacent to the liver. 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 Moderate hiatal hernia. Electronically Signed   By: KRolm BaptiseM.D.   On: 05/28/2021 23:38   DG Abd Portable 1V-Small Bowel Obstruction Protocol-initial, 8 hr delay  Result Date: 05/29/2021 CLINICAL DATA:  Small bowel protocol EXAM: PORTABLE ABDOMEN - 1 VIEW COMPARISON:  CT 05/28/2021 FINDINGS: Esophageal tube tip and side port overlie the proximal stomach. Enteral contrast is present within the colon. There is contrast within the urinary bladder. There is some air distended small bowel in the left lower quadrant. IMPRESSION: Small bowel distension within the left lower quadrant. Enteral contrast is present in the colon. Electronically Signed   By: KDonavan FoilM.D.   On: 05/29/2021 21:12   VAS [place]OWER EXTREMITY VENOUS (DVT)  Result Date: 05/22/2021  Lower Venous DVT Study Patient Name:  DANKITA NEWCOMER Date of Exam:   05/21/2021 Medical Rec #: [id number]    Accession #:    [id number] of Birth: 1Sep 27, 1952    Patient Gender: F Patient Age:   89years Exam Location:  [facility] Procedure:      VAS [place]OWER EXTREMITY VENOUS (DVT) Referring Phys: SEAN CHILDRESS --------------------------------------------------------------------------------  Indications: Swelling, edema and pain in the left lower extremity x 4 days. Patient denies SOB.  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 Comparison Study: NA Performing Technologist: KSharlett IlesRVT  Examination Guidelines: Daysi Boggan complete evaluation includes B-mode imaging, spectral Doppler, color Doppler, and power Doppler as needed of all accessible portions of  each vessel. Bilateral testing is considered an integral part of Hitesh Fouche complete examination. Limited examinations for reoccurring indications may be performed as noted. The reflux portion of the exam is performed with the patient in reverse Trendelenburg.  +-----+---------------+---------+-----------+----------+--------------+  RIGHT Compressibility Phasicity Spontaneity Properties Thrombus Aging  +-----+---------------+---------+-----------+----------+--------------+  CFV   Full            Yes       Yes                                    +-----+---------------+---------+-----------+----------+--------------+   +---------+---------------+---------+-----------+----------+--------------+  LEFT      Compressibility Phasicity Spontaneity Properties Thrombus Aging  +---------+---------------+---------+-----------+----------+--------------+  CFV       Full            Yes       Yes                                    +---------+---------------+---------+-----------+----------+--------------+  SFJ       Full            Yes       Yes                                    +---------+---------------+---------+-----------+----------+--------------+  FV Prox   Full            Yes       Yes                                    +---------+---------------+---------+-----------+----------+--------------+  FV Mid     Full                                                             +---------+---------------+---------+-----------+----------+--------------+  FV Distal Full            Yes       Yes                                    +---------+---------------+---------+-----------+----------+--------------+  PFV       Full            Yes       Yes                                    +---------+---------------+---------+-----------+----------+--------------+  POP       Full            Yes       Yes                                    +---------+---------------+---------+-----------+----------+--------------+  PTV       Full            Yes       Yes                                    +---------+---------------+---------+-----------+----------+--------------+  PERO      Full            Yes       Yes                                    +---------+---------------+---------+-----------+----------+--------------+  Gastroc   Full                                                             +---------+---------------+---------+-----------+----------+--------------+  GSV       Full            Yes       Yes                                    +---------+---------------+---------+-----------+----------+--------------+     Summary: RIGHT: - No evidence of common femoral vein obstruction.  LEFT: - No evidence of deep vein thrombosis in the lower extremity. No indirect evidence of obstruction proximal to the inguinal ligament. - No cystic structure found in the popliteal fossa.  *See table(s) above for measurements and observations. Electronically signed by Larae Grooms MD on 05/22/2021 at 4:38:11 PM.    Final     Microbiology: Recent Results (from the past 240 hour(s))  Resp Panel by RT-PCR (Flu Latica Hohmann&B, Covid) Nasopharyngeal Swab     Status: None   Collection Time: 05/28/21 11:49 PM   Specimen: Nasopharyngeal Swab; Nasopharyngeal(NP) swabs in vial transport medium  Result Value Ref Range Status   SARS Coronavirus 2 by RT PCR NEGATIVE  NEGATIVE Final    Comment: (NOTE) SARS-CoV-2 target nucleic acids are NOT DETECTED.  The SARS-CoV-2 RNA is generally detectable in upper respiratory specimens during the acute phase of infection. The lowest concentration of SARS-CoV-2 viral copies this assay can detect is 138 copies/mL. Goran Olden negative result does not preclude SARS-Cov-2 infection and should not be used as the sole basis for treatment or other patient management decisions. Samil Mecham negative result may occur with  improper specimen collection/handling, submission of specimen other than nasopharyngeal swab, presence of viral mutation(s) within the areas targeted by this assay, and inadequate number of viral copies(<138 copies/mL). Claressa Hughley negative result must be combined with clinical observations, patient history, and epidemiological information. The expected result is Negative.  Fact Sheet for Patients:  EntrepreneurPulse.com.au  Fact Sheet for Healthcare Providers:  IncredibleEmployment.be  This test is no t yet approved or cleared by the [place] FDA and  has been authorized for detection and/or diagnosis of SARS-CoV-2 by FDA under an Emergency Use Authorization (EUA). This EUA will remain  in effect (meaning this test can be used) for the duration of the COVID-19 declaration under Section 564(b)(1) of the Act, 21 U.S.C.section 360bbb-3(b)(1), unless the authorization is terminated  or revoked sooner.       Influenza Shrinika Blatz by PCR NEGATIVE NEGATIVE Final   Influenza B by PCR NEGATIVE NEGATIVE Final    Comment: (NOTE) The Xpert Xpress SARS-CoV-2/FLU/RSV plus assay is intended as an aid in the diagnosis of influenza from Nasopharyngeal swab specimens and should not be used as Kennard Fildes sole basis for treatment. Nasal washings and aspirates are unacceptable for Xpert Xpress  SARS-CoV-2/FLU/RSV testing.  Fact Sheet for Patients: EntrepreneurPulse.com.au  Fact Sheet for Healthcare  Providers: IncredibleEmployment.be  This test is not yet approved or cleared by the [place] FDA and has been authorized for detection and/or diagnosis of SARS-CoV-2 by FDA under an Emergency Use Authorization (EUA). This EUA will remain in effect (meaning this test can be used) for the duration of the COVID-19 declaration under Section 564(b)(1) of the Act, 21 U.S.C. section 360bbb-3(b)(1), unless the authorization is terminated or revoked.  Performed at [facility], [address] [place], [place] [address]      Labs: Basic Metabolic Panel: Recent Labs  Lab 05/29/21 0512 05/30/21 0320 05/31/21 0315 06/01/21 0304 06/02/21 0320  NA 141 144 137 135 137  K 3.5 3.3* 3.5 3.2* 3.7  CL 106 111 106 104 104  CO2 '27 25 25 24 24  ' GLUCOSE 150* 114* 98 96 108*  BUN '20 20 17 14 11  ' CREATININE 1.28* 1.25* 1.26* 0.99 1.06*  CALCIUM 9.2 8.7* 8.7* 8.8* 9.0  MG 1.9 2.0 1.8 1.8 1.9  PHOS  --  3.5 3.1 3.4 3.2   Liver Function Tests: Recent Labs  Lab 05/28/21 2150 05/30/21 0320 05/31/21 0315 06/01/21 0304 06/02/21 0320  AST 14* 14* 14* 13* 14*  ALT '11 13 12 11 12  ' ALKPHOS 72 47 43 44 45  BILITOT 0.6 0.8 0.7 0.7 0.8  PROT 8.0 6.0* 6.0* 6.0* 6.1*  ALBUMIN 4.3 3.1* 3.1* 3.1* 3.2*   Recent Labs  Lab 05/28/21 2150  LIPASE <10*   No results for input(s): AMMONIA in the last 168 hours. CBC: Recent Labs  Lab 05/29/21 0512 05/30/21 0320 05/31/21 0315 06/01/21 0304 06/02/21 0320  WBC 7.9 5.3 4.7 4.2 4.3  NEUTROABS  --  3.1 2.7 2.2 2.4  HGB 13.7 11.7* 10.9* 11.1* 11.8*  HCT 42.2 37.3 34.6* 34.2* 35.4*  MCV 98.4 100.0 100.3* 98.6 97.3  PLT 312 257 225 203 228   Cardiac Enzymes: No results for input(s): CKTOTAL, CKMB, CKMBINDEX, TROPONINI in the last 168 hours. BNP: BNP (last 3 results) No results for input(s): BNP in the last 8760 hours.  ProBNP (last 3 results) No results for input(s): PROBNP in the last 8760  hours.  CBG: Recent Labs  Lab 06/01/21 1636 06/01/21 2049 06/02/21 0749 06/02/21 1151 06/02/21 1622  GLUCAP 110* 102* 84 124* 133*       Signed:  Fayrene Helper MD.  [facility] 06/02/2021, 5:36 PM

## 2021-06-02 NOTE — Progress Notes (Signed)
Patient discharged to home w/ family. Given all belongings, instructions. Verbalized understanding of all instructions. Escorted to pov via w/c. 

## 2021-06-02 NOTE — Evaluation (Signed)
Physical Therapy Evaluation Patient Details Name: Cloyce Paterson MRN: [id number] DOB: 11/14/1950 Today's Date: 06/02/2021  History of Present Illness  Kathlene Garrison is a pleasant 71 y.o. female with medical history significant for hypertension, type 2 diabetes mellitus,TKA 12/22 and CKD stage IIIa, now presenting to the emergency department for evaluation of nominal pain with nausea, vomiting, and diarrhea, SBO.  Clinical Impression  The patient has not ambulated yet. Patient did tolerate x 100' using Rw.  Patient  reports that her Left knee is improved and the edema is less.  Patient  has ~  100  degrees left knee flexion. Patient hopes to continue OPPT as PTA. Pt admitted with above diagnosis.  Pt currently with functional limitations due to the deficits listed below (see PT Problem List). Pt will benefit from skilled PT to increase their independence and safety with mobility to allow discharge to the venue listed below.         Recommendations for follow up therapy are one component of a multi-disciplinary discharge planning process, led by the attending physician.  Recommendations may be updated based on patient status, additional functional criteria and insurance authorization.  Follow Up Recommendations Outpatient PT    Assistance Recommended at Discharge PRN  Patient can return home with the following       Equipment Recommendations None recommended by PT  Recommendations for Other Services       Functional Status Assessment Patient has had a recent decline in their functional status and demonstrates the ability to make significant improvements in function in a reasonable and predictable amount of time.     Precautions / Restrictions Precautions Precautions: Fall;Knee Restrictions Weight Bearing Restrictions: No      Mobility  Bed Mobility               General bed mobility comments: in recliner    Transfers Overall transfer level: Needs assistance Equipment  used: Rolling walker (2 wheels) Transfers: Sit to/from Stand Sit to Stand: Modified independent (Device/Increase time)                Ambulation/Gait Ambulation/Gait assistance: Supervision Gait Distance (Feet): 100 Feet Assistive device: Rolling walker (2 wheels) Gait Pattern/deviations: Step-through pattern          Stairs            Wheelchair Mobility    Modified Rankin (Stroke Patients Only)       Balance                                             Pertinent Vitals/Pain Pain Assessment Pain Score: 2  Pain Location: abdomen Pain Descriptors / Indicators: Discomfort Pain Intervention(s): Monitored during session    Home Living Family/patient expects to be discharged to:: Private residence Living Arrangements: Spouse/significant other Available Help at Discharge: Family Type of Home: House Home Access: Level entry       Home Layout: Two level   Additional Comments: has been staying on first floor since TKA.    Prior Function Prior Level of Function : Independent/Modified Independent             Mobility Comments: using RW for ambulation, going to OPPT       Hand Dominance        Extremity/Trunk Assessment   Upper Extremity Assessment Upper Extremity Assessment: Overall WFL for tasks assessed    Lower  Extremity Assessment Lower Extremity Assessment: Overall WFL for tasks assessed LLE Deficits / Details: knee g=flexion 5-100 degrees., quads 4+/5       Communication      Cognition Arousal/Alertness: Awake/alert Behavior During Therapy: WFL for tasks assessed/performed Overall Cognitive Status: Within Functional Limits for tasks assessed                                          General Comments      Exercises Total Joint Exercises Heel Slides: 10 reps, AROM, Seated, Both Long Arc Quad: AROM, 10 reps, Left, Seated   Assessment/Plan    PT Assessment Patient needs continued PT  services  PT Problem List Decreased strength;Decreased mobility;Decreased range of motion;Decreased activity tolerance;Decreased balance;Decreased knowledge of use of DME;Pain;Obesity       PT Treatment Interventions DME instruction;Therapeutic activities;Gait training;Functional mobility training;Therapeutic exercise;Patient/family education;Stair training    PT Goals (Current goals can be found in the Care Plan section)  Acute Rehab PT Goals Patient Stated Goal: to continue my PT    Frequency Min 3X/week     Co-evaluation               AM-PAC PT "6 Clicks" Mobility  Outcome Measure Help needed turning from your back to your side while in a flat bed without using bedrails?: None Help needed moving from lying on your back to sitting on the side of a flat bed without using bedrails?: None Help needed moving to and from a bed to a chair (including a wheelchair)?: None Help needed standing up from a chair using your arms (e.g., wheelchair or bedside chair)?: None Help needed to walk in hospital room?: A Little Help needed climbing 3-5 steps with a railing? : A Little 6 Click Score: 22    End of Session   Activity Tolerance: Patient tolerated treatment well Patient left: in chair;with call bell/phone within reach Nurse Communication: Mobility status PT Visit Diagnosis: Other abnormalities of gait and mobility (R26.89);Difficulty in walking, not elsewhere classified (R26.2)    Time: 9470-9628 PT Time Calculation (min) (ACUTE ONLY): 27 min   Charges:   PT Evaluation $PT Eval Low Complexity: 1 Low PT Treatments $Gait Training: 8-22 mins       Tresa Endo PT Acute Rehabilitation Services Pager [phone] Office [phone] Claretha Cooper 06/02/2021, 12:15 PM

## 2021-06-02 NOTE — Assessment & Plan Note (Signed)
Follow outpatient with PCP

## 2021-06-02 NOTE — Assessment & Plan Note (Signed)
Follow outpatient with surgery

## 2021-06-09 DIAGNOSIS — K469 Unspecified abdominal hernia without obstruction or gangrene: Secondary | ICD-10-CM | POA: Diagnosis not present

## 2021-06-09 DIAGNOSIS — Z09 Encounter for follow-up examination after completed treatment for conditions other than malignant neoplasm: Secondary | ICD-10-CM | POA: Diagnosis not present

## 2021-06-09 DIAGNOSIS — K529 Noninfective gastroenteritis and colitis, unspecified: Secondary | ICD-10-CM | POA: Diagnosis not present

## 2021-06-09 DIAGNOSIS — R109 Unspecified abdominal pain: Secondary | ICD-10-CM | POA: Diagnosis not present

## 2021-06-13 DIAGNOSIS — M25562 Pain in left knee: Secondary | ICD-10-CM | POA: Diagnosis not present

## 2021-06-16 DIAGNOSIS — M25562 Pain in left knee: Secondary | ICD-10-CM | POA: Diagnosis not present

## 2021-06-19 DIAGNOSIS — M25562 Pain in left knee: Secondary | ICD-10-CM | POA: Diagnosis not present

## 2021-06-23 DIAGNOSIS — M25562 Pain in left knee: Secondary | ICD-10-CM | POA: Diagnosis not present

## 2021-06-26 DIAGNOSIS — K432 Incisional hernia without obstruction or gangrene: Secondary | ICD-10-CM | POA: Diagnosis not present

## 2021-06-27 DIAGNOSIS — M25562 Pain in left knee: Secondary | ICD-10-CM | POA: Diagnosis not present

## 2021-06-27 DIAGNOSIS — I10 Essential (primary) hypertension: Secondary | ICD-10-CM | POA: Diagnosis not present

## 2021-06-27 DIAGNOSIS — E1169 Type 2 diabetes mellitus with other specified complication: Secondary | ICD-10-CM | POA: Diagnosis not present

## 2021-06-30 DIAGNOSIS — M25562 Pain in left knee: Secondary | ICD-10-CM | POA: Diagnosis not present

## 2021-07-03 DIAGNOSIS — Z4789 Encounter for other orthopedic aftercare: Secondary | ICD-10-CM | POA: Diagnosis not present

## 2021-07-03 DIAGNOSIS — Z471 Aftercare following joint replacement surgery: Secondary | ICD-10-CM | POA: Diagnosis not present

## 2021-07-04 DIAGNOSIS — M25562 Pain in left knee: Secondary | ICD-10-CM | POA: Diagnosis not present

## 2021-07-07 DIAGNOSIS — M25562 Pain in left knee: Secondary | ICD-10-CM | POA: Diagnosis not present

## 2021-07-10 DIAGNOSIS — M25562 Pain in left knee: Secondary | ICD-10-CM | POA: Diagnosis not present

## 2021-07-14 ENCOUNTER — Ambulatory Visit: Payer: Self-pay | Admitting: Surgery

## 2021-07-14 DIAGNOSIS — M25562 Pain in left knee: Secondary | ICD-10-CM | POA: Diagnosis not present

## 2021-07-14 NOTE — Progress Notes (Signed)
Surgery orders requested via Epic inbox. °

## 2021-07-16 NOTE — Patient Instructions (Addendum)
DUE TO COVID-19 ONLY ONE VISITOR  (aged 72 and older)  IS ALLOWED TO COME WITH YOU AND STAY IN THE WAITING ROOM ONLY DURING PRE OP AND PROCEDURE.   ?**NO VISITORS ARE ALLOWED IN THE SHORT STAY AREA OR RECOVERY ROOM!!** ? ?IF YOU WILL BE ADMITTED INTO THE HOSPITAL YOU ARE ALLOWED ONLY TWO SUPPORT PEOPLE DURING VISITATION HOURS ONLY (7 AM -8PM)   ?The support person(s) must pass our screening, gel in and out, and wear a mask at all times, including in the patient?s room. ?Patients must also wear a mask when staff or their support person are in the room. ?Visitors GUEST BADGE MUST BE WORN VISIBLY  ?One adult visitor may remain with you overnight and MUST be in the room by 8 P.M. ?  ? ? Your procedure is scheduled on: Monday, August 04, 2021 ? ? Report to [facility] Main Entrance ? ?  Report to admitting at  7:45 AM ? ? Call this number if you have problems the morning of surgery [phone] ? ? Do not eat food :After Midnight. ? ? After Midnight you may have the following liquids until 7:00 AM DAY OF SURGERY ? ?Water ?Black Coffee (sugar ok, NO MILK/CREAM OR CREAMERS)  ?Tea (sugar ok, NO MILK/CREAM OR CREAMERS) regular and decaf                             ?Plain Jell-O (NO RED)                                           ?Fruit ices (not with fruit pulp, NO RED)                                     ?Popsicles (NO RED)                                                                  ?Juice: apple, WHITE grape, WHITE cranberry ?Sports drinks like Gatorade (NO RED) ?Clear broth(vegetable,chicken,beef) ? ?             ?FOLLOW BOWEL PREP AND ANY ADDITIONAL PRE OP INSTRUCTIONS YOU RECEIVED FROM YOUR SURGEON'S OFFICE!!! ?  ?  ?Oral Hygiene is also important to reduce your risk of infection.                                    ?Remember - BRUSH YOUR TEETH THE MORNING OF SURGERY WITH YOUR REGULAR TOOTHPASTE ? ? Do NOT smoke after Midnight ? ? Take these medicines the morning of surgery with A SIP OF WATER: Vibegron ? ?DO  NOT TAKE ANY ORAL DIABETIC MEDICATIONS DAY OF YOUR SURGERY ? ?Bring CPAP mask and tubing day of surgery. ?                  ?           You may not have any metal on your body including hair  pins, jewelry, and body piercing ? ?           Do not wear make-up, lotions, powders, perfumes/cologne, or deodorant ? ?Do not wear nail polish including gel and S&S, artificial/acrylic nails, or any other type of covering on natural nails including finger and toenails. If you have artificial nails, gel coating, etc. that needs to be removed by a nail salon please have this removed prior to surgery or surgery may need to be canceled/ delayed if the surgeon/ anesthesia feels like they are unable to be safely monitored.  ? ?Do not shave  48 hours prior to surgery.  ? ? Do not bring valuables to the hospital. [facility] NOT ?            RESPONSIBLE   FOR VALUABLES. ? ? Contacts, dentures or bridgework may not be worn into surgery. ? ? Bring small overnight bag day of surgery. ?  ? Patients discharged on the day of surgery will not be allowed to drive home.  Someone NEEDS to stay with you for the first 24 hours after anesthesia. ? ? Special Instructions: Bring a copy of your healthcare power of attorney and living will documents         the day of surgery if you haven't scanned them before. ? ?            Please read over the following fact sheets you were given: IF [place] [phone] ? ?   [facility] - Preparing for Surgery ?Before surgery, you can play an important role.  Because skin is not sterile, your skin needs to be as free of germs as possible.  You can reduce the number of germs on your skin by washing with CHG (chlorahexidine gluconate) soap before surgery.  CHG is an antiseptic cleaner which kills germs and bonds with the skin to continue killing germs even after washing. ?Please DO NOT use if you have an allergy to CHG or antibacterial soaps.  If your skin  becomes reddened/irritated stop using the CHG and inform your nurse when you arrive at Short Stay. ?Do not shave (including legs and underarms) for at least 48 hours prior to the first CHG shower.  You may shave your face/neck. ? ?Please follow these instructions carefully: ? 1.  Shower with CHG Soap the night before surgery and the  morning of surgery. ? 2.  If you choose to wash your hair, wash your hair first as usual with your normal  shampoo. ? 3.  After you shampoo, rinse your hair and body thoroughly to remove the shampoo.                            ? 4.  Use CHG as you would any other liquid soap.  You can apply chg directly to the skin and wash.  Gently with a scrungie or clean washcloth. ? 5.  Apply the CHG Soap to your body ONLY FROM THE NECK DOWN.   Do   not use on face/ open      ?                     Wound or open sores. Avoid contact with eyes, ears mouth and   genitals (private parts).  ?  Wash face,  Genitals (private parts) with your normal soap. ?            6.  Wash thoroughly, paying special attention to the area where your    surgery  will be performed. ? 7.  Thoroughly rinse your body with warm water from the neck down. ? 8.  DO NOT shower/wash with your normal soap after using and rinsing off the CHG Soap. ?               9.  Pat yourself dry with a clean towel. ?           10.  Wear clean pajamas. ?           11.  Place clean sheets on your bed the night of your first shower and do not  sleep with pets. ?Day of Surgery : ?Do not apply any lotions/deodorants the morning of surgery.  Please wear clean clothes to the hospital/surgery center. ? ?FAILURE TO FOLLOW THESE INSTRUCTIONS MAY RESULT IN THE CANCELLATION OF YOUR SURGERY ? ?PATIENT SIGNATURE_________________________________ ? ?NURSE SIGNATURE__________________________________ ? ?________________________________________________________________________  ?

## 2021-07-17 DIAGNOSIS — N3946 Mixed incontinence: Secondary | ICD-10-CM | POA: Diagnosis not present

## 2021-07-18 DIAGNOSIS — M25562 Pain in left knee: Secondary | ICD-10-CM | POA: Diagnosis not present

## 2021-07-22 ENCOUNTER — Other Ambulatory Visit: Payer: Self-pay

## 2021-07-22 ENCOUNTER — Encounter (HOSPITAL_COMMUNITY)
Admission: RE | Admit: 2021-07-22 | Discharge: 2021-07-22 | Disposition: A | Discharge status: Home / Self Care | Payer: Medicare HMO | Source: Ambulatory Visit | Attending: Surgery | Admitting: Surgery

## 2021-07-22 ENCOUNTER — Encounter (HOSPITAL_COMMUNITY): Payer: Self-pay

## 2021-07-22 VITALS — BP 165/79 | HR 63 | Temp 97.8°F | Resp 18 | Ht 62.0 in | Wt 206.0 lb

## 2021-07-22 DIAGNOSIS — E1122 Type 2 diabetes mellitus with diabetic chronic kidney disease: Secondary | ICD-10-CM | POA: Insufficient documentation

## 2021-07-22 DIAGNOSIS — Z01812 Encounter for preprocedural laboratory examination: Secondary | ICD-10-CM | POA: Diagnosis not present

## 2021-07-22 DIAGNOSIS — N1831 Chronic kidney disease, stage 3a: Secondary | ICD-10-CM | POA: Diagnosis not present

## 2021-07-22 LAB — CBC
HCT: 41.5 % (ref 36.0–46.0)
Hemoglobin: 13.1 g/dL (ref 12.0–15.0)
MCH: 30.8 pg (ref 26.0–34.0)
MCHC: 31.6 g/dL (ref 30.0–36.0)
MCV: 97.6 fL (ref 80.0–100.0)
Platelets: 279 10*3/uL (ref 150–400)
RBC: 4.25 MIL/uL (ref 3.87–5.11)
RDW: 15.5 % (ref 11.5–15.5)
WBC: 4.8 10*3/uL (ref 4.0–10.5)
nRBC: 0 % (ref 0.0–0.2)

## 2021-07-22 LAB — BASIC METABOLIC PANEL
Anion gap: 7 (ref 5–15)
BUN: 21 mg/dL (ref 8–23)
CO2: 25 mmol/L (ref 22–32)
Calcium: 9.8 mg/dL (ref 8.9–10.3)
Chloride: 107 mmol/L (ref 98–111)
Creatinine, Ser: 1.23 mg/dL — ABNORMAL HIGH (ref 0.44–1.00)
GFR, Estimated: 47 mL/min — ABNORMAL LOW (ref >60)
Glucose, Bld: 95 mg/dL (ref 70–99)
Potassium: 4.6 mmol/L (ref 3.5–5.1)
Sodium: 139 mmol/L (ref 135–145)

## 2021-07-22 LAB — GLUCOSE, CAPILLARY: Glucose-Capillary: 89 mg/dL (ref 70–99)

## 2021-07-22 LAB — HEMOGLOBIN A1C
Hgb A1c MFr Bld: 6 % — ABNORMAL HIGH (ref 4.8–5.6)
Mean Plasma Glucose: 125.5 mg/dL

## 2021-07-22 NOTE — Progress Notes (Signed)
COVID test-no ? ? ?Bowel prep reminder:NA ? ?PCP - Dr. Lavina Hamman ?Cardiologist - no ? ?Chest x-ray - 05/28/21-epic ?EKG - 05/28/21-epic ?Stress Test - no ?ECHO - no ?Cardiac Cath - no ?Pacemaker/ICD device last checked:NA ? ?Sleep Study - no ?CPAP -  ? ?Fasting Blood Sugar - 127-135 ?Checks Blood Sugar _____ times a day. Once a month ? ?Blood Thinner Instructions:NA ?Aspirin Instructions: ?Last Dose: ? ?Anesthesia review: no ? ?Patient denies shortness of breath, fever, cough and chest pain at PAT appointment ?Pt has no SOB with any activities ? ? ?Patient verbalized understanding of instructions that were given to them at the PAT appointment. Patient was also instructed that they will need to review over the PAT instructions again at home before surgery. yes ?

## 2021-08-11 NOTE — Progress Notes (Signed)
Spoke to patient and gave her updated arrival time of 1200 for surgery on 08-19-21.  Patient was reminded no solid food after midnight and that she can have clear liquids from midnight until 1115 day of surgery.  No change in medical history.  All questions answered and patient stated understanding.   ?

## 2021-08-18 ENCOUNTER — Ambulatory Visit: Payer: Medicare HMO | Admitting: Podiatry

## 2021-08-18 ENCOUNTER — Encounter: Payer: Self-pay | Admitting: Podiatry

## 2021-08-18 DIAGNOSIS — M79675 Pain in left toe(s): Secondary | ICD-10-CM

## 2021-08-18 DIAGNOSIS — M79674 Pain in right toe(s): Secondary | ICD-10-CM

## 2021-08-18 DIAGNOSIS — L84 Corns and callosities: Secondary | ICD-10-CM

## 2021-08-18 DIAGNOSIS — E119 Type 2 diabetes mellitus without complications: Secondary | ICD-10-CM | POA: Diagnosis not present

## 2021-08-18 DIAGNOSIS — B351 Tinea unguium: Secondary | ICD-10-CM

## 2021-08-18 NOTE — Anesthesia Preprocedure Evaluation (Addendum)
Anesthesia Evaluation  ?Patient identified by MRN, date of birth, ID band ?Patient awake ? ? ? ?Reviewed: ?Allergy & Precautions, NPO status , Patient's Chart, lab work & pertinent test results ? ?History of Anesthesia Complications ?Negative for: history of anesthetic complications ? ?Airway ?Mallampati: II ? ?TM Distance: >3 FB ?Neck ROM: Full ? ? ? Dental ? ?(+) Dental Advisory Given ?  ?Pulmonary ?neg pulmonary ROS,  ?  ?Pulmonary exam normal ? ? ? ? ? ? ? Cardiovascular ?hypertension, Pt. on medications ?Normal cardiovascular exam ? ? ?  ?Neuro/Psych ?negative neurological ROS ? negative psych ROS  ? GI/Hepatic ?negative GI ROS, Neg liver ROS,   ?Endo/Other  ?diabetes, Type 2, Oral Hypoglycemic Agents ?Obesity ? ? Renal/GU ?negative Renal ROS  ? ?  ?Musculoskeletal ? ?(+) Arthritis , Osteoarthritis,   ? Abdominal ?  ?Peds ? Hematology ?negative hematology ROS ?(+)   ?Anesthesia Other Findings ? ? Reproductive/Obstetrics ? ?  ? ? ? ? ? ? ? ? ? ? ? ? ? ?  ?  ? ? ? ? ? ? ? ?Anesthesia Physical ?Anesthesia Plan ? ?ASA: 3 ? ?Anesthesia Plan: General  ? ?Post-op Pain Management: Tylenol PO (pre-op)*  ? ?Induction: Intravenous ? ?PONV Risk Score and Plan: 3 and Treatment may vary due to age or medical condition, Ondansetron and Dexamethasone ? ?Airway Management Planned: Oral ETT ? ?Additional Equipment: None ? ?Intra-op Plan:  ? ?Post-operative Plan: Extubation in OR ? ?Informed Consent: I have reviewed the patients History and Physical, chart, labs and discussed the procedure including the risks, benefits and alternatives for the proposed anesthesia with the patient or authorized representative who has indicated his/her understanding and acceptance.  ? ? ? ?Dental advisory given ? ?Plan Discussed with: CRNA and Anesthesiologist ? ?Anesthesia Plan Comments:   ? ? ? ? ? ?Anesthesia Quick Evaluation ? ?

## 2021-08-19 ENCOUNTER — Encounter (HOSPITAL_COMMUNITY): Payer: Self-pay | Admitting: Surgery

## 2021-08-19 ENCOUNTER — Encounter (HOSPITAL_COMMUNITY)
Admission: AD | Disposition: A | Discharge status: Home / Self Care | Payer: Self-pay | Source: Ambulatory Visit | Attending: Surgery

## 2021-08-19 ENCOUNTER — Other Ambulatory Visit: Payer: Self-pay

## 2021-08-19 ENCOUNTER — Inpatient Hospital Stay (HOSPITAL_COMMUNITY)
Admission: AD | Admit: 2021-08-19 | Discharge: 2021-08-24 | DRG: 330 | Disposition: A | Discharge status: Home / Self Care | Payer: Medicare HMO | Source: Ambulatory Visit | Attending: Surgery | Admitting: Surgery

## 2021-08-19 ENCOUNTER — Ambulatory Visit (HOSPITAL_COMMUNITY): Payer: Medicare HMO | Admitting: Anesthesiology

## 2021-08-19 ENCOUNTER — Ambulatory Visit (HOSPITAL_BASED_OUTPATIENT_CLINIC_OR_DEPARTMENT_OTHER): Payer: Medicare HMO | Admitting: Anesthesiology

## 2021-08-19 DIAGNOSIS — I10 Essential (primary) hypertension: Secondary | ICD-10-CM

## 2021-08-19 DIAGNOSIS — K430 Incisional hernia with obstruction, without gangrene: Principal | ICD-10-CM | POA: Diagnosis present

## 2021-08-19 DIAGNOSIS — Z79899 Other long term (current) drug therapy: Secondary | ICD-10-CM

## 2021-08-19 DIAGNOSIS — Y733 Surgical instruments, materials and gastroenterology and urology devices (including sutures) associated with adverse incidents: Secondary | ICD-10-CM | POA: Diagnosis not present

## 2021-08-19 DIAGNOSIS — K644 Residual hemorrhoidal skin tags: Secondary | ICD-10-CM | POA: Diagnosis present

## 2021-08-19 DIAGNOSIS — E119 Type 2 diabetes mellitus without complications: Secondary | ICD-10-CM | POA: Diagnosis present

## 2021-08-19 DIAGNOSIS — Y69 Unspecified misadventure during surgical and medical care: Secondary | ICD-10-CM | POA: Diagnosis not present

## 2021-08-19 DIAGNOSIS — Z882 Allergy status to sulfonamides status: Secondary | ICD-10-CM

## 2021-08-19 DIAGNOSIS — K9181 Other intraoperative complications of digestive system: Secondary | ICD-10-CM | POA: Diagnosis not present

## 2021-08-19 DIAGNOSIS — Z7984 Long term (current) use of oral hypoglycemic drugs: Secondary | ICD-10-CM

## 2021-08-19 DIAGNOSIS — Z85038 Personal history of other malignant neoplasm of large intestine: Secondary | ICD-10-CM

## 2021-08-19 DIAGNOSIS — Z888 Allergy status to other drugs, medicaments and biological substances status: Secondary | ICD-10-CM

## 2021-08-19 DIAGNOSIS — Z8249 Family history of ischemic heart disease and other diseases of the circulatory system: Secondary | ICD-10-CM

## 2021-08-19 DIAGNOSIS — K432 Incisional hernia without obstruction or gangrene: Principal | ICD-10-CM | POA: Diagnosis present

## 2021-08-19 DIAGNOSIS — K429 Umbilical hernia without obstruction or gangrene: Secondary | ICD-10-CM | POA: Diagnosis not present

## 2021-08-19 DIAGNOSIS — Z880 Allergy status to penicillin: Secondary | ICD-10-CM | POA: Diagnosis not present

## 2021-08-19 HISTORY — PX: LAPAROSCOPY: SHX197

## 2021-08-19 HISTORY — PX: UMBILICAL HERNIA REPAIR: SHX196

## 2021-08-19 LAB — CBC
HCT: 37.2 % (ref 36.0–46.0)
Hemoglobin: 12.2 g/dL (ref 12.0–15.0)
MCH: 31.2 pg (ref 26.0–34.0)
MCHC: 32.8 g/dL (ref 30.0–36.0)
MCV: 95.1 fL (ref 80.0–100.0)
Platelets: 261 10*3/uL (ref 150–400)
RBC: 3.91 MIL/uL (ref 3.87–5.11)
RDW: 15 % (ref 11.5–15.5)
WBC: 11 10*3/uL — ABNORMAL HIGH (ref 4.0–10.5)
nRBC: 0 % (ref 0.0–0.2)

## 2021-08-19 LAB — GLUCOSE, CAPILLARY
Glucose-Capillary: 157 mg/dL — ABNORMAL HIGH (ref 70–99)
Glucose-Capillary: 220 mg/dL — ABNORMAL HIGH (ref 70–99)
Glucose-Capillary: 184 mg/dL — ABNORMAL HIGH (ref 70–99)

## 2021-08-19 LAB — CREATININE, SERUM
Creatinine, Ser: 1.27 mg/dL — ABNORMAL HIGH (ref 0.44–1.00)
GFR, Estimated: 45 mL/min — ABNORMAL LOW (ref >60)

## 2021-08-19 SURGERY — REPAIR, HERNIA, UMBILICAL, ADULT
Anesthesia: General | Site: Abdomen

## 2021-08-19 MED ORDER — AMLODIPINE BESYLATE 10 MG PO TABS
10.0000 mg | ORAL_TABLET | Freq: Every day | ORAL | Status: DC
  Administered 2021-08-19 – 2021-08-24 (×6): 10 mg via ORAL
  Filled 2021-08-19 (×6): qty 1

## 2021-08-19 MED ORDER — HYDROMORPHONE HCL 1 MG/ML IJ SOLN: 0.5000 mg | INTRAMUSCULAR | Status: DC | PRN

## 2021-08-19 MED ORDER — ORAL CARE MOUTH RINSE: 15.0000 mL | Freq: Once | OROMUCOSAL | Status: AC

## 2021-08-19 MED ORDER — LACTATED RINGERS IR SOLN
Status: DC | PRN
  Administered 2021-08-19: 1000 mL

## 2021-08-19 MED ORDER — VIBEGRON 75 MG PO TABS
75.0000 mg | ORAL_TABLET | Freq: Every day | ORAL | Status: DC
  Administered 2021-08-23 – 2021-08-24 (×2): 75 mg via ORAL

## 2021-08-19 MED ORDER — ONDANSETRON HCL 4 MG/2ML IJ SOLN
INTRAMUSCULAR | Status: AC
  Filled 2021-08-19: qty 2

## 2021-08-19 MED ORDER — ROCURONIUM BROMIDE 10 MG/ML (PF) SYRINGE
PREFILLED_SYRINGE | INTRAVENOUS | Status: AC
  Filled 2021-08-19: qty 10

## 2021-08-19 MED ORDER — ACETAMINOPHEN 500 MG PO TABS
1000.0000 mg | ORAL_TABLET | ORAL | Status: AC
  Administered 2021-08-19: 1000 mg via ORAL
  Filled 2021-08-19: qty 2

## 2021-08-19 MED ORDER — ONDANSETRON HCL 4 MG/2ML IJ SOLN
4.0000 mg | Freq: Once | INTRAMUSCULAR | Status: AC | PRN
  Administered 2021-08-19: 4 mg via INTRAVENOUS

## 2021-08-19 MED ORDER — ACETAMINOPHEN 500 MG PO TABS: 1000.0000 mg | ORAL_TABLET | Freq: Once | ORAL | Status: DC

## 2021-08-19 MED ORDER — OXYCODONE HCL 5 MG PO TABS: 5.0000 mg | ORAL_TABLET | Freq: Once | ORAL | Status: DC | PRN

## 2021-08-19 MED ORDER — BUPIVACAINE LIPOSOME 1.3 % IJ SUSP: 20.0000 mL | Freq: Once | INTRAMUSCULAR | Status: DC

## 2021-08-19 MED ORDER — DEXAMETHASONE SODIUM PHOSPHATE 10 MG/ML IJ SOLN
INTRAMUSCULAR | Status: AC
  Filled 2021-08-19: qty 1

## 2021-08-19 MED ORDER — MIDAZOLAM HCL 5 MG/5ML IJ SOLN
INTRAMUSCULAR | Status: DC | PRN
  Administered 2021-08-19: 2 mg via INTRAVENOUS

## 2021-08-19 MED ORDER — BUPIVACAINE-EPINEPHRINE (PF) 0.25% -1:200000 IJ SOLN
INTRAMUSCULAR | Status: AC
  Filled 2021-08-19: qty 30

## 2021-08-19 MED ORDER — ONDANSETRON HCL 4 MG/2ML IJ SOLN
INTRAMUSCULAR | Status: DC | PRN
  Administered 2021-08-19: 4 mg via INTRAVENOUS

## 2021-08-19 MED ORDER — LACTATED RINGERS IV SOLN: INTRAVENOUS | Status: DC

## 2021-08-19 MED ORDER — PHENYLEPHRINE HCL (PRESSORS) 10 MG/ML IV SOLN
INTRAVENOUS | Status: AC
  Filled 2021-08-19: qty 1

## 2021-08-19 MED ORDER — BENAZEPRIL HCL 10 MG PO TABS
40.0000 mg | ORAL_TABLET | Freq: Every day | ORAL | Status: DC
  Administered 2021-08-19 – 2021-08-24 (×6): 40 mg via ORAL
  Filled 2021-08-19 (×6): qty 4

## 2021-08-19 MED ORDER — SUGAMMADEX SODIUM 200 MG/2ML IV SOLN
INTRAVENOUS | Status: DC | PRN
  Administered 2021-08-19: 200 mg via INTRAVENOUS

## 2021-08-19 MED ORDER — 0.9 % SODIUM CHLORIDE (POUR BTL) OPTIME
TOPICAL | Status: DC | PRN
Start: 2021-08-19 — End: 2021-08-19
  Administered 2021-08-19: 1000 mL

## 2021-08-19 MED ORDER — CHLORHEXIDINE GLUCONATE CLOTH 2 % EX PADS: 6.0000 | MEDICATED_PAD | Freq: Once | CUTANEOUS | Status: DC

## 2021-08-19 MED ORDER — FENTANYL CITRATE PF 50 MCG/ML IJ SOSY
25.0000 ug | PREFILLED_SYRINGE | INTRAMUSCULAR | Status: DC | PRN
  Administered 2021-08-19 (×3): 50 ug via INTRAVENOUS

## 2021-08-19 MED ORDER — BUPIVACAINE-EPINEPHRINE 0.25% -1:200000 IJ SOLN
INTRAMUSCULAR | Status: DC | PRN
Start: 2021-08-19 — End: 2021-08-19
  Administered 2021-08-19: 30 mL

## 2021-08-19 MED ORDER — LIDOCAINE 2% (20 MG/ML) 5 ML SYRINGE
INTRAMUSCULAR | Status: DC | PRN
  Administered 2021-08-19: 80 mg via INTRAVENOUS
  Administered 2021-08-19: 1.5 mg/kg/h via INTRAVENOUS

## 2021-08-19 MED ORDER — FENTANYL CITRATE (PF) 100 MCG/2ML IJ SOLN
INTRAMUSCULAR | Status: AC
  Filled 2021-08-19: qty 2

## 2021-08-19 MED ORDER — PHENYLEPHRINE HCL-NACL 20-0.9 MG/250ML-% IV SOLN
INTRAVENOUS | Status: DC | PRN
  Administered 2021-08-19: 25 ug/min via INTRAVENOUS

## 2021-08-19 MED ORDER — BUPIVACAINE LIPOSOME 1.3 % IJ SUSP
INTRAMUSCULAR | Status: AC
  Filled 2021-08-19: qty 20

## 2021-08-19 MED ORDER — OXYCODONE HCL 5 MG PO TABS: 5.0000 mg | ORAL_TABLET | ORAL | Status: DC | PRN

## 2021-08-19 MED ORDER — INSULIN ASPART 100 UNIT/ML IJ SOLN
0.0000 [IU] | Freq: Three times a day (TID) | INTRAMUSCULAR | Status: DC
  Administered 2021-08-19: 5 [IU] via SUBCUTANEOUS
  Administered 2021-08-23 – 2021-08-24 (×2): 2 [IU] via SUBCUTANEOUS

## 2021-08-19 MED ORDER — CALCIUM CARBONATE 1250 (500 CA) MG PO TABS
1.0000 | ORAL_TABLET | Freq: Every day | ORAL | Status: DC
  Administered 2021-08-20 – 2021-08-24 (×5): 500 mg via ORAL
  Filled 2021-08-19 (×5): qty 1

## 2021-08-19 MED ORDER — ENOXAPARIN SODIUM 40 MG/0.4ML IJ SOSY
40.0000 mg | PREFILLED_SYRINGE | Freq: Once | INTRAMUSCULAR | Status: AC
  Administered 2021-08-19: 40 mg via SUBCUTANEOUS
  Filled 2021-08-19: qty 0.4

## 2021-08-19 MED ORDER — FENTANYL CITRATE (PF) 100 MCG/2ML IJ SOLN
INTRAMUSCULAR | Status: AC
Start: 2021-08-19 — End: ?
  Filled 2021-08-19: qty 2

## 2021-08-19 MED ORDER — DEXAMETHASONE SODIUM PHOSPHATE 10 MG/ML IJ SOLN
INTRAMUSCULAR | Status: DC | PRN
  Administered 2021-08-19: 5 mg via INTRAVENOUS

## 2021-08-19 MED ORDER — CHLORHEXIDINE GLUCONATE 0.12 % MT SOLN
15.0000 mL | Freq: Once | OROMUCOSAL | Status: AC
  Administered 2021-08-19: 15 mL via OROMUCOSAL

## 2021-08-19 MED ORDER — ENOXAPARIN SODIUM 40 MG/0.4ML IJ SOSY
40.0000 mg | PREFILLED_SYRINGE | INTRAMUSCULAR | Status: DC
  Administered 2021-08-20 – 2021-08-24 (×5): 40 mg via SUBCUTANEOUS
  Filled 2021-08-19 (×5): qty 0.4

## 2021-08-19 MED ORDER — INSULIN ASPART 100 UNIT/ML IJ SOLN: 0.0000 [IU] | Freq: Every day | INTRAMUSCULAR | Status: DC

## 2021-08-19 MED ORDER — CLINDAMYCIN PHOSPHATE 900 MG/50ML IV SOLN
900.0000 mg | INTRAVENOUS | Status: AC
  Administered 2021-08-19: 900 mg via INTRAVENOUS
  Filled 2021-08-19: qty 50

## 2021-08-19 MED ORDER — FENTANYL CITRATE (PF) 100 MCG/2ML IJ SOLN
INTRAMUSCULAR | Status: DC | PRN
  Administered 2021-08-19 (×2): 50 ug via INTRAVENOUS
  Administered 2021-08-19: 25 ug via INTRAVENOUS
  Administered 2021-08-19: 50 ug via INTRAVENOUS
  Administered 2021-08-19: 25 ug via INTRAVENOUS

## 2021-08-19 MED ORDER — FENTANYL CITRATE PF 50 MCG/ML IJ SOSY
PREFILLED_SYRINGE | INTRAMUSCULAR | Status: AC
  Filled 2021-08-19: qty 3

## 2021-08-19 MED ORDER — ROCURONIUM BROMIDE 10 MG/ML (PF) SYRINGE
PREFILLED_SYRINGE | INTRAVENOUS | Status: DC | PRN
  Administered 2021-08-19: 60 mg via INTRAVENOUS

## 2021-08-19 MED ORDER — PROPOFOL 10 MG/ML IV BOLUS
INTRAVENOUS | Status: DC | PRN
  Administered 2021-08-19: 150 mg via INTRAVENOUS

## 2021-08-19 MED ORDER — AMLODIPINE BESY-BENAZEPRIL HCL 10-40 MG PO CAPS: 1.0000 | ORAL_CAPSULE | Freq: Every day | ORAL | Status: DC

## 2021-08-19 MED ORDER — GABAPENTIN 300 MG PO CAPS
300.0000 mg | ORAL_CAPSULE | ORAL | Status: AC
  Administered 2021-08-19: 300 mg via ORAL
  Filled 2021-08-19: qty 1

## 2021-08-19 MED ORDER — BUPIVACAINE LIPOSOME 1.3 % IJ SUSP
INTRAMUSCULAR | Status: DC | PRN
  Administered 2021-08-19: 20 mL

## 2021-08-19 MED ORDER — OXYCODONE HCL 5 MG/5ML PO SOLN: 5.0000 mg | Freq: Once | ORAL | Status: DC | PRN

## 2021-08-19 MED ORDER — PROPOFOL 10 MG/ML IV BOLUS
INTRAVENOUS | Status: AC
  Filled 2021-08-19: qty 20

## 2021-08-19 MED ORDER — EPHEDRINE SULFATE-NACL 50-0.9 MG/10ML-% IV SOSY
PREFILLED_SYRINGE | INTRAVENOUS | Status: DC | PRN
  Administered 2021-08-19: 5 mg via INTRAVENOUS

## 2021-08-19 MED ORDER — MIDAZOLAM HCL 2 MG/2ML IJ SOLN
INTRAMUSCULAR | Status: AC
  Filled 2021-08-19: qty 2

## 2021-08-19 SURGICAL SUPPLY — 33 items
ADH SKN CLS APL DERMABOND .7 (GAUZE/BANDAGES/DRESSINGS) ×1
BALL CTTN LRG ABS STRL LF (GAUZE/BANDAGES/DRESSINGS)
COTTONBALL LRG STERILE PKG (GAUZE/BANDAGES/DRESSINGS) ×1 IMPLANT
COVER SURGICAL LIGHT HANDLE (MISCELLANEOUS) ×2 IMPLANT
DERMABOND ADVANCED (GAUZE/BANDAGES/DRESSINGS) ×1
DERMABOND ADVANCED .7 DNX12 (GAUZE/BANDAGES/DRESSINGS) ×1 IMPLANT
DRAIN CHANNEL RND F F (WOUND CARE) ×1 IMPLANT
DRSG TEGADERM 4X4.75 (GAUZE/BANDAGES/DRESSINGS) ×2 IMPLANT
ELECT PENCIL ROCKER SW 15FT (MISCELLANEOUS) ×1 IMPLANT
ELECT REM PT RETURN 15FT ADLT (MISCELLANEOUS) ×2 IMPLANT
EVACUATOR SILICONE 100CC (DRAIN) ×1 IMPLANT
GLOVE BIOGEL PI IND STRL 8 (GLOVE) ×1 IMPLANT
GLOVE BIOGEL PI INDICATOR 8 (GLOVE) ×1
GOWN STRL REUS W/ TWL XL LVL3 (GOWN DISPOSABLE) ×1 IMPLANT
GOWN STRL REUS W/TWL XL LVL3 (GOWN DISPOSABLE) ×6
KIT BASIN OR (CUSTOM PROCEDURE TRAY) ×2 IMPLANT
KIT TURNOVER KIT A (KITS) ×1 IMPLANT
NS IRRIG 1000ML POUR BTL (IV SOLUTION) ×2 IMPLANT
SHEARS HARMONIC ACE PLUS 45CM (MISCELLANEOUS) ×1 IMPLANT
SLEEVE Z-THREAD 5X100MM (TROCAR) ×2 IMPLANT
SPONGE DRAIN TRACH 4X4 STRL 2S (GAUZE/BANDAGES/DRESSINGS) ×1 IMPLANT
SPONGE T-LAP 18X18 ~~LOC~~+RFID (SPONGE) ×2 IMPLANT
SUT MNCRL AB 4-0 PS2 18 (SUTURE) ×2 IMPLANT
SUT NOVA NAB GS-21 0 18 T12 DT (SUTURE) IMPLANT
SUT PDS AB 2-0 CT2 27 (SUTURE) IMPLANT
SUT VIC AB 2-0 SH 27 (SUTURE) ×4
SUT VIC AB 2-0 SH 27X BRD (SUTURE) IMPLANT
SUT VIC AB 3-0 SH 8-18 (SUTURE) ×2 IMPLANT
SYR CONTROL 10ML LL (SYRINGE) ×2 IMPLANT
TAPE CLOTH SURG 4X10 WHT LF (GAUZE/BANDAGES/DRESSINGS) ×1 IMPLANT
TOWEL OR 17X26 10 PK STRL BLUE (TOWEL DISPOSABLE) ×2 IMPLANT
TRAY FOLEY MTR SLVR 16FR STAT (SET/KITS/TRAYS/PACK) IMPLANT
TRAY LAPAROSCOPIC (CUSTOM PROCEDURE TRAY) ×1 IMPLANT

## 2021-08-19 NOTE — Progress Notes (Signed)
Patient does not have PRN pain medication. I called [facility] twice to get orders at approximately 1530 and 1630. I have not heard from anyone or seen new orders. ?

## 2021-08-19 NOTE — Progress Notes (Signed)
?  Transition of Care (TOC) Screening Note ? ? ?Patient Details  ?Name: Christina [name] ?Date of Birth: 23-Mar-1951 ? ? ?Transition of Care (TOC) CM/SW Contact:    ?Tannen Vandezande, LCSW ?Phone Number: ?08/19/2021, 2:09 PM ? ? ? ?Transition of Care Department [facility]) has reviewed patient and no TOC needs have been identified at this time. We will continue to monitor patient advancement through interdisciplinary progression rounds. If new patient transition needs arise, please place a TOC consult. ? ? ?

## 2021-08-19 NOTE — Anesthesia Postprocedure Evaluation (Signed)
Anesthesia Post Note ? ?Patient: Christina [name] ? ?Procedure(s) Performed: PRIMARY REPAIR OF PERIUMBILICAL INCISIONAL HERNIA (Abdomen) ?LAPAROSCOPIC LYSIS OF ADHESIONS AND REPAIR OF ENTEROTOMY (Abdomen) ? ?  ? ?Patient location during evaluation: PACU ?Anesthesia Type: General ?Level of consciousness: awake and alert ?Pain management: pain level controlled ?Vital Signs Assessment: post-procedure vital signs reviewed and stable ?Respiratory status: spontaneous breathing, nonlabored ventilation, respiratory function stable and patient connected to nasal cannula oxygen ?Cardiovascular status: blood pressure returned to baseline and stable ?Postop Assessment: no apparent nausea or vomiting ?Anesthetic complications: no ? ? ?No notable events documented. ? ?Last Vitals:  ?Vitals:  ? 08/19/21 1215 08/19/21 1245  ?BP: 122/69 137/78  ?Pulse: 70 77  ?Resp: 16 19  ?Temp:  (!) 36.4 ?C  ?SpO2: 91% 94%  ?  ?Last Pain:  ?Vitals:  ? 08/19/21 1245  ?TempSrc: Oral  ?PainSc: 3   ? ? ?  ?  ?  ?  ?  ?  ? ?Audry Pili ? ? ? ? ?

## 2021-08-19 NOTE — Transfer of Care (Signed)
Immediate Anesthesia Transfer of Care Note ? ?Patient: Terree Gaultney ? ?Procedure(s) Performed: PRIMARY REPAIR OF PERIUMBILICAL INCISIONAL HERNIA (Abdomen) ?LAPAROSCOPIC LYSIS OF ADHESIONS AND REPAIR OF ENTEROTOMY (Abdomen) ? ?Patient Location: PACU ? ?Anesthesia Type:General ? ?Level of Consciousness: awake, alert , oriented and patient cooperative ? ?Airway & Oxygen Therapy: Patient Spontanous Breathing and Patient connected to face mask oxygen ? ?Post-op Assessment: Report given to RN, Post -op Vital signs reviewed and stable and Patient moving all extremities ? ?Post vital signs: Reviewed and stable ? ?Last Vitals:  ?Vitals Value Taken Time  ?BP 140/75 08/19/21 1008  ?Temp 36.4 ?C 08/19/21 1008  ?Pulse 87 08/19/21 1011  ?Resp 17 08/19/21 1011  ?SpO2 95 % 08/19/21 1011  ?Vitals shown include unvalidated device data. ? ?Last Pain:  ?Vitals:  ? 08/19/21 1008  ?TempSrc:   ?PainSc: 0-No pain  ?   ? ?Patients Stated Pain Goal: 4 (08/19/21 0550) ? ?Complications: No notable events documented. ?

## 2021-08-19 NOTE — H&P (Signed)
? ?Admitting Physician: Nickola Major Zaedyn Covin ? ?Service: General Surgery ? ?CC: Hernia ? ?Subjective  ? ?HPI: ?Christina [name] is a 71 y.o. female who is seen for evaluation of New Consultation (Vent. Hernia ) ?.  ? ?Christina [name] was admitted to the hospital from January 26 to January 30 with a small bowel obstruction. Please see CT scan from her stay below. There was concern for enteritis and she was able to improving leave the hospital. She presents for follow-up due to a hernia found on CT scan. ? ?She has been continuing hernia recovery since leaving the hospital. It finishes up in March. She has been eating and drinking and having bowel movements. She still has some discomfort at her periumbilical incisional hernia. The epigastric pain that she presented to the hospital with is better. ? ?  ? ?Past Medical History:  ?Diagnosis Date  ? Arthritis   ? OA RIGHT KNEE AND PAIN  ? Cancer ([place]) 05/04/2009  ? HX OF COLON CANCER; S/P SURGERY AND DID NOT HAVE TO HAVE CHEMO OR RADIATION  ? Diabetes mellitus without complication ([place])   ? Hypertension   ? ? ?Past Surgical History:  ?Procedure Laterality Date  ? BREAST SURGERY    ? RIGHT AND LEFT BREAST TUMORS REMOVED - BENIGN age 74 and 33  ? C-SECTIONS X 2  1981  ? 1991  ? COLON SURGERY  2015  ? CYST EXCISION Left 2020  ? hand and ganglian cyst and 2021 Lt arm  ? [place]  ? FATTY TUMOR REMOVED FROM RIGHT HEEL    ? age 51  ? TONSILLECTOMY    ? AT AGE OF 12  ? TOTAL KNEE ARTHROPLASTY Right 08/02/2013  ? Procedure: RIGHT TOTAL KNEE ARTHROPLASTY;  Surgeon: Tobi Bastos, MD;  Location: [facility];  Service: Orthopedics;  Laterality: Right;  ? TOTAL KNEE ARTHROPLASTY Left 03/31/2021  ? Procedure: TOTAL KNEE ARTHROPLASTY;  Surgeon: Gaynelle Arabian, MD;  Location: [facility];  Service: Orthopedics;  Laterality: Left;  ? ? ?Family History  ?Problem Relation Age of Onset  ? Hypertension Mother   ? Heart attack Mother   ? ? ?Social:  reports that she has never smoked.  She has never used smokeless tobacco. She reports that she does not drink alcohol and does not use drugs. ? ?Allergies:  ?Allergies  ?Allergen Reactions  ? Penicillins Dermatitis and Rash  ?  Tolerated Cephalosporin Date: 04/01/21. ?rash/ skin peel/ and nails come off  ? Sulfa Antibiotics Nausea And Vomiting  ?  Muscle Cramps  ? Cholestyramine Diarrhea  ? Gemfibrozil Diarrhea and Nausea And Vomiting  ? Statins   ?  Cramps, muscle pain  ? ? ?Medications: ?Current Outpatient Medications  ?Medication Instructions  ? ACCU-CHEK AVIVA PLUS test strip No dose, route, or frequency recorded.  ? Accu-Chek Softclix Lancets lancets No dose, route, or frequency recorded.  ? acetaminophen (TYLENOL) 1,000 mg, Oral, Every 6 hours PRN  ? Alcohol Swabs (B-D SINGLE USE SWABS REGULAR) PADS USE TO TEST BLOOD SUGAR ONCE A DAY  ? amLODipine-benazepril (LOTREL) 10-40 MG capsule 1 capsule, Oral, Daily  ? APPLE CIDER VINEGAR PO 3 capsules, Oral, Daily, [place] Gummy  ? Blood Glucose Calibration (ACCU-CHEK GUIDE CONTROL) LIQD See admin instructions  ? Blood Glucose Monitoring Suppl (ACCU-CHEK GUIDE) w/Device KIT See admin instructions  ? Calcium Carbonate (CALCIUM 600 PO) 600 mg, Oral, Daily  ? cholecalciferol (VITAMIN D3) 1,000 Units, Oral, Daily  ? hydrocortisone (ANUSOL-HC) 2.5 % rectal cream  Apply to hemorrhoids daily.  ? Lidocaine, Anorectal, (RECTICARE) 5 % CREA Apply to hemorrhoids as needed for pain.  ? metFORMIN (GLUCOPHAGE-XR) 1,000 mg, Oral, Daily with breakfast  ? neomycin-bacitracin-polymyxin (NEOSPORIN) ointment 1 application., Topical, As needed  ? OVER THE COUNTER MEDICATION 2 capsules, Oral, Daily, Imnuneti otc supplement  ? Vibegron 75 mg, Oral, Daily  ? ? ?ROS - all of the below systems have been reviewed with the patient and positives are indicated with bold text ?General: chills, fever or night sweats ?Eyes: blurry vision or double vision ?ENT: epistaxis or sore throat ?Allergy/Immunology: itchy/watery eyes or nasal  congestion ?Hematologic/Lymphatic: bleeding problems, blood clots or swollen lymph nodes ?Endocrine: temperature intolerance or unexpected weight changes ?Breast: new or changing breast lumps or nipple discharge ?Resp: cough, shortness of breath, or wheezing ?CV: chest pain or dyspnea on exertion ?GI: as per HPI ?GU: dysuria, trouble voiding, or hematuria ?MSK: joint pain or joint stiffness ?Neuro: TIA or stroke symptoms ?Derm: pruritus and skin lesion changes ?Psych: anxiety and depression ? ?Objective  ? ?PE ?Blood pressure (!) 159/83, pulse 75, temperature (!) 97.5 ?F (36.4 ?C), temperature source Oral, resp. rate 18, height _0  (1.575 m), weight 93.4 kg, SpO2 98 %. ?Constitutional: NAD; conversant; no deformities ?Eyes: Moist conjunctiva; no lid lag; anicteric; PERRL ?Neck: Trachea midline; no thyromegaly ?Lungs: Normal respiratory effort; no tactile fremitus ?CV: RRR; no palpable thrills; no pitting edema ?GI: Abd Soft, non-tender, periumbilical hernia; no palpable hepatosplenomegaly ?MSK: Normal range of motion of extremities; no clubbing/cyanosis ?Psychiatric: Appropriate affect; alert and oriented x3 ?Lymphatic: No palpable cervical or axillary lymphadenopathy ? ?No results found for this or any previous visit (from the past 24 hour(s)). ? ?Imaging Orders  ?No imaging studies ordered today  ?CT Abd/Pel 05/28/21: ?Radiology impression: ?Thick walled, dilated small bowel loops in the mid to left abdomen. Proximal to these thickened small bowel loops, jejunal small bowel loops are mildly dilated and fluid-filled. Findings are concerning for enteritis with a degree of small-bowel obstruction. This most likely reflects infectious or inflammatory enteritis. Mesenteric vessels are patent making ischemic enteritis less likely. ?  ?Anterior lower abdominal wall ventral hernia containing fat, fluid and a small bowel loop. This does not appear to be the cause of the small bowel obstruction. ?  ?Free fluid adjacent to  the dilated thick-walled small bowel loops as well as adjacent to the liver. ?  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 ?  ?Moderate hiatal hernia. ? ?My impression: ?Very attenuated upper abdominal wall. This may all be a large hernia sac, or a diastases, or denervation of the rectus muscle. Small hernia defect may be a daughter hernia of the larger hernia or a hernia through the diastases rated about the level of the umbilicus containing a piece of incarcerated intestine. Significant small bowel edema and free fluid concerning for surgical emergency or perforation, however she was able to recover from this in the hospital. In the upper abdomen appears to have a layer on the right of the midline that may represent the posterior rectus sheath with herniation above this of the intestine.  ? ? ?Assessment and Plan  ?Incisional hernia, without obstruction or gangrene ? ?Measures about 2 cm x 2 cm ? ?It appears Christina [name] had a periumbilical incisional hernia with incarcerated small intestine when she presented to the hospital. This must of been reduced and now she is feeling better. Concerned about recurrent issues. I recommended periumbilical 2 cm x 2 cm incisional hernia  repair with mesh. I do not quite understand the anatomy of her upper abdominal wall on CT and physical exam. There is no clear fascial defects, however the rectus muscle appears attenuated with a large diastases. On the CT it looks like the posterior rectus sheath on the right side may be drooping with herniation of the abdominal contents into the retrorectus space. I am also worried that she may have adhesive disease from her previous colon surgery that caused her bowel obstruction. At the time of hernia repair, I recommend diagnostic laparoscopy to better understand her anatomy and lyse any problematic adhesions. The procedure itself as well as its risk, benefits, and alternatives were discussed. After full discussion, the patient  granted consent to proceed.  ? ? ?Christina Morn, MD ? ?[facility] ?Use [email or web address] to contact on call provider ? ? ? ?

## 2021-08-19 NOTE — Op Note (Signed)
? ?Patient: Christina [name] (08-24-1950, [id number]) ? ?Date of Surgery: 08/19/2021  ? ?Preoperative Diagnosis:  ?PERIUMBILICAL INCARCERATED INCISIONAL HERNIA WITH TWO SMALL DEFECTS ? ?Postoperative Diagnosis:  ?PERIUMBILICAL INCARCERATED INCISIONAL HERNIA WITH TWO SMALL HERNIA DEFECTS MEASURING 2CM TALL X 4 CM WIDE ?ENTEROTOMY CREATED IN HEPATIC FLEXURE OF THE COLON ? ? ?Surgical Procedure:  ?PRIMARY REPAIR OF INCARCERATED PERIUMBILICAL INCISIONAL HERNIA MEASURING 2CM TALL BY 4 CM WIDE ?LAPAROSCOPIC REPAIR OF ENTEROTOMY IN HEPATIC FLEXURE OF THE COLON ?LAPAROSCOPIC LYSIS OF ADHESIONS ? ? ?Operative Team Members:  ?Surgeon(s) and Role: ?   * Nyx Keady, Nickola Major, MD - Primary  ? ?Anesthesiologist: Audry Pili, MD ?CRNA: Victoriano Lain, CRNA; Montel Clock, CRNA  ? ?Anesthesia: General  ? ?Fluids:  ?Total I/O ?In: 1050 [I.V.:1000; IV Piggyback:50] ?Out: 15 [Blood:15] ? ?Complications: Enterotomy made in hepatic flexure of the colon with optical entry with 90m trocar - repaired with 2 layers of 2-0 vicryl suture and covered by omentum. ? ?Drains:  (19) Jackson-Pratt drain(s) with closed bulb suction in the RUQ near colon injury exiting the LUQ port site   ? ?Specimen: None ? ?Disposition:  PACU - hemodynamically stable. ? ?Plan of Care: Admit for overnight observation ? ? ? ?Indications for Procedure: ?It appears Ms. Jenkin had a periumbilical incisional hernia with incarcerated small intestine when she presented to the hospital. This must of been reduced and now she is feeling better. Concerned about recurrent issues. I recommended periumbilical 2 cm x 2 cm incisional hernia repair with mesh. I do not quite understand the anatomy of her upper abdominal wall on CT and physical exam. There is no clear fascial defects, however the rectus muscle appears attenuated with a large diastases. On the CT it looks like the posterior rectus sheath on the right side may be drooping with herniation of the abdominal contents  into the retrorectus space. I am also worried that she may have adhesive disease from her previous colon surgery that caused her bowel obstruction. At the time of hernia repair, I recommend diagnostic laparoscopy to better understand her anatomy and lyse any problematic adhesions. The procedure itself as well as its risk, benefits, and alternatives were discussed. After full discussion, the patient granted consent to proceed.  ? ?Findings: Adhesions of the transverse colon and right colon to the abdominal wall.  Incarcerated small bowel in periumbilical hernia defect.  Two periumbilical hernia defects under previous midline laparotomy scar in an area 4cm wide by 2 cm tall closed primarily with 0-novafil suture. ? ? ?Description of Procedure:  ? ?On the date stated above patient taken operating room suite and placed in supine position.  General endotracheal anesthesia was induced.  A timeout was completed verifying the correct patient, procedure, position, and equipment needed for the case.  The patient's abdomen was prepped and draped in usual sterile fashion. ? ?I made a 5 mm incision in the right upper quadrant and inserted a 5 mm trocar using the optical technique.  Once insufflation was started, it was clear that I was in the lumen of the colon.  I pulled the trocar out some, and was in a retrorectus space.  The posterior rectus fascia was entered in a different area away from this colon at injury and I was able to insufflate the peritoneum.  I was able to see across the upper abdomen and a 5 mm trocar was placed in the left upper quadrant.  2 additional 5 mm trocars were able to be placed down the  left side of the abdominal wall.  The abdomen was inspected.  There were dense adhesions of the colon to the right abdomen.  There was small intestine and a periumbilical hernia defect.  There were 2 periumbilical hernia defects in an area of 4 cm wide by 2 cm tall, 1 with incarcerated omentum, 1 with the small bowel.   All adhesions were lysed carefully off the abdominal wall.  This took about 30 minutes.  The small bowel was delivered out of the periumbilical hernia.  The omentum was delivered out of the periumbilical hernia.  I carefully dissected out the adhesions in the right upper quadrant.  The omentum was lifted off of the transverse colon working proximally to uncover the enterotomy in the hepatic flexure of the colon.  The harmonic scalpel was used to aid in this dissection.  The enterotomy was defined and dissected free of the attachments to the omentum.  This was closed with cholecystectomy 2-0 Vicryl in 2 layers.  The omentum was then tied over this to patch repair.  A 19 round channel drain was brought through the left upper quadrant trocar and placed over this injury.  A tap block was injected bilaterally.  The defect in the peritoneum and posterior rectus sheath was closed using a 0 Vicryl in a PMI to prevent an intraparietal hernia postoperatively.  I then directed my attention to his repairing the periumbilical incisional hernia.  Due to the colon injury and the location of these hernias, I decided to proceed with primary repair with Novafil suture.  A incision was made around the umbilicus.  The umbilical stalk was dissected off the fascia of the abdominal wall.  The fascial defect of the hernia was defined using Bovie electrocautery.  I was careful to define the both the hernia at the base the umbilicus and a nearby hernia defect just to the left of the umbilicus.  The fascia was cleared off using electrocautery and then these were closed using multiple 0 Novafil sutures in figure-of-eight fashion.  The deep dermal tissues were then reapproximated using Vicryl suture.  The skin was closed using 4-0 Monocryl.  Dermabond was applied.  A drain sponge was applied.  All sponge and needle counts were correct at the end of this case.  Patient ? ?At the end of the case we reviewed the infection status of the  case. ?Patient: Private Patient Elective Case ?Case: Elective ?Infection Present At Time Of Surgery (PATOS):  Some contamination, controlled, from enterotomy created in hepatic flexure of the colon during the case ? ?Louanna Raw, MD ?General, Bariatric, & Minimally Invasive Surgery ?[facility], [place] ? ?

## 2021-08-19 NOTE — Anesthesia Procedure Notes (Signed)
Procedure Name: Intubation ?Date/Time: 08/19/2021 7:49 AM ?Performed by: Victoriano Lain, CRNA ?Pre-anesthesia Checklist: Patient identified, Emergency Drugs available, Suction available, Patient being monitored and Timeout performed ?Patient Re-evaluated:Patient Re-evaluated prior to induction ?Oxygen Delivery Method: Circle system utilized ?Preoxygenation: Pre-oxygenation with 100% oxygen ?Induction Type: IV induction ?Ventilation: Mask ventilation without difficulty ?Laryngoscope Size: Mac and 4 ?Grade View: Grade I ?Tube type: Oral ?Tube size: 7.0 mm ?Number of attempts: 1 ?Airway Equipment and Method: Stylet ?Placement Confirmation: ETT inserted through vocal cords under direct vision, positive ETCO2 and breath sounds checked- equal and bilateral ?Secured at: 21 cm ?Tube secured with: Tape ?Dental Injury: Teeth and Oropharynx as per pre-operative assessment  ? ? ? ? ?

## 2021-08-20 ENCOUNTER — Encounter (HOSPITAL_COMMUNITY): Payer: Self-pay | Admitting: Surgery

## 2021-08-20 DIAGNOSIS — Z888 Allergy status to other drugs, medicaments and biological substances status: Secondary | ICD-10-CM | POA: Diagnosis not present

## 2021-08-20 DIAGNOSIS — K430 Incisional hernia with obstruction, without gangrene: Secondary | ICD-10-CM | POA: Diagnosis present

## 2021-08-20 DIAGNOSIS — Z8249 Family history of ischemic heart disease and other diseases of the circulatory system: Secondary | ICD-10-CM | POA: Diagnosis not present

## 2021-08-20 DIAGNOSIS — Y733 Surgical instruments, materials and gastroenterology and urology devices (including sutures) associated with adverse incidents: Secondary | ICD-10-CM | POA: Diagnosis not present

## 2021-08-20 DIAGNOSIS — Z7984 Long term (current) use of oral hypoglycemic drugs: Secondary | ICD-10-CM | POA: Diagnosis not present

## 2021-08-20 DIAGNOSIS — E119 Type 2 diabetes mellitus without complications: Secondary | ICD-10-CM | POA: Diagnosis present

## 2021-08-20 DIAGNOSIS — K9181 Other intraoperative complications of digestive system: Secondary | ICD-10-CM | POA: Diagnosis not present

## 2021-08-20 DIAGNOSIS — I10 Essential (primary) hypertension: Secondary | ICD-10-CM | POA: Diagnosis present

## 2021-08-20 DIAGNOSIS — Z85038 Personal history of other malignant neoplasm of large intestine: Secondary | ICD-10-CM | POA: Diagnosis not present

## 2021-08-20 DIAGNOSIS — Z880 Allergy status to penicillin: Secondary | ICD-10-CM | POA: Diagnosis not present

## 2021-08-20 DIAGNOSIS — K644 Residual hemorrhoidal skin tags: Secondary | ICD-10-CM | POA: Diagnosis present

## 2021-08-20 DIAGNOSIS — Z79899 Other long term (current) drug therapy: Secondary | ICD-10-CM | POA: Diagnosis not present

## 2021-08-20 DIAGNOSIS — Z882 Allergy status to sulfonamides status: Secondary | ICD-10-CM | POA: Diagnosis not present

## 2021-08-20 DIAGNOSIS — Y69 Unspecified misadventure during surgical and medical care: Secondary | ICD-10-CM | POA: Diagnosis not present

## 2021-08-20 LAB — GLUCOSE, CAPILLARY
Glucose-Capillary: 80 mg/dL (ref 70–99)
Glucose-Capillary: 100 mg/dL — ABNORMAL HIGH (ref 70–99)
Glucose-Capillary: 111 mg/dL — ABNORMAL HIGH (ref 70–99)
Glucose-Capillary: 107 mg/dL — ABNORMAL HIGH (ref 70–99)
Glucose-Capillary: 118 mg/dL — ABNORMAL HIGH (ref 70–99)

## 2021-08-20 MED ORDER — ONDANSETRON HCL 4 MG/2ML IJ SOLN
4.0000 mg | Freq: Four times a day (QID) | INTRAMUSCULAR | Status: DC | PRN
Start: 2021-08-20 — End: 2021-08-24

## 2021-08-20 MED ORDER — PROCHLORPERAZINE EDISYLATE 10 MG/2ML IJ SOLN: 10.0000 mg | INTRAMUSCULAR | Status: DC | PRN

## 2021-08-20 MED ORDER — TRAMADOL HCL 50 MG PO TABS
50.0000 mg | ORAL_TABLET | Freq: Four times a day (QID) | ORAL | Status: DC | PRN
Start: 2021-08-20 — End: 2021-08-24
  Administered 2021-08-20 – 2021-08-21 (×3): 50 mg via ORAL
  Filled 2021-08-20 (×5): qty 1

## 2021-08-20 MED ORDER — SIMETHICONE 80 MG PO CHEW: 80.0000 mg | CHEWABLE_TABLET | Freq: Four times a day (QID) | ORAL | Status: DC | PRN

## 2021-08-20 MED ORDER — DOCUSATE SODIUM 100 MG PO CAPS
100.0000 mg | ORAL_CAPSULE | Freq: Two times a day (BID) | ORAL | Status: DC
  Administered 2021-08-20 – 2021-08-24 (×9): 100 mg via ORAL
  Filled 2021-08-20 (×9): qty 1

## 2021-08-20 MED ORDER — MENTHOL 3 MG MT LOZG
1.0000 | LOZENGE | OROMUCOSAL | Status: DC | PRN
  Administered 2021-08-20: 3 mg via ORAL
  Filled 2021-08-20: qty 9

## 2021-08-20 NOTE — Plan of Care (Signed)
?  Problem: Education: ?Goal: Knowledge of General Education information will improve ?Description: Including pain rating scale, medication(s)/side effects and non-pharmacologic comfort measures ?Outcome: Progressing ?  ?Problem: Health Behavior/Discharge Planning: ?Goal: Ability to manage health-related needs will improve ?Outcome: Progressing ?  ?Problem: Clinical Measurements: ?Goal: Ability to maintain clinical measurements within normal limits will improve ?Outcome: Progressing ?Goal: Respiratory complications will improve ?Outcome: Progressing ?  ?Problem: Nutrition: ?Goal: Adequate nutrition will be maintained ?Outcome: Progressing ?  ?Problem: Elimination: ?Goal: Will not experience complications related to bowel motility ?Outcome: Progressing ?Goal: Will not experience complications related to urinary retention ?Outcome: Progressing ?  ?Problem: Pain Managment: ?Goal: General experience of comfort will improve ?Outcome: Progressing ?  ?Problem: Safety: ?Goal: Ability to remain free from injury will improve ?Outcome: Progressing ?  ?Problem: Skin Integrity: ?Goal: Risk for impaired skin integrity will decrease ?Outcome: Progressing ?  ?

## 2021-08-20 NOTE — Progress Notes (Signed)
Progress Note: General Surgery Service  ? ?Chief Complaint/Subjective: ?Some pain around incisions and across upper abdomen.  No nausea/vomiting.  Tolerating clear liquids ? ?Objective: ?Vital signs in last 24 hours: ?Temp:  [97.5 ?F (36.4 ?C)-98.9 ?F (37.2 ?C)] 98.9 ?F (37.2 ?C) (04/19 0631) ?Pulse Rate:  [66-86] 73 (04/19 0631) ?Resp:  [9-19] 18 (04/19 0631) ?BP: (112-151)/(58-78) 119/58 (04/19 0631) ?SpO2:  [89 %-99 %] 91 % (04/19 0631) ?Last BM Date : 08/18/21 ? ?Intake/Output from previous day: ?04/18 0701 - 04/19 0700 ?In: 2507.9 [P.O.:600; I.V.:1857.9; IV Piggyback:50] ?Out: 2560 [Urine:2350; Drains:195; Blood:15] ?Intake/Output this shift: ?No intake/output data recorded. ? ?GI: Abd incisions c/d/I w/ glue, JP serosanguinous ? ?Lab Results: ?CBC  ?Recent Labs  ?  08/19/21 ?1312  ?WBC 11.0*  ?HGB 12.2  ?HCT 37.2  ?PLT 261  ? ?BMET ?Recent Labs  ?  08/19/21 ?1312  ?CREATININE 1.27*  ? ?PT/INR ?No results for input(s): LABPROT, INR in the last 72 hours. ?ABG ?No results for input(s): PHART, HCO3 in the last 72 hours. ? ?Invalid input(s): PCO2, PO2 ? ?Anti-infectives: ?Anti-infectives (From admission, onward)  ? ? Start     Dose/Rate Route Frequency Ordered Stop  ? 08/19/21 0600  clindamycin (CLEOCIN) IVPB 900 mg       ? 900 mg ?100 mL/hr over 30 Minutes Intravenous On call to O.R. 08/19/21 0569 08/19/21 0820  ? ?  ? ? ?Medications: ?Scheduled Meds: ? amLODipine  10 mg Oral Daily  ? And  ? benazepril  40 mg Oral Daily  ? calcium carbonate  1 tablet Oral QAC breakfast  ? docusate sodium  100 mg Oral BID  ? enoxaparin (LOVENOX) injection  40 mg Subcutaneous Q24H  ? insulin aspart  0-15 Units Subcutaneous TID WC  ? insulin aspart  0-5 Units Subcutaneous QHS  ? Vibegron  75 mg Oral Daily  ? ?Continuous Infusions: ? lactated ringers 50 mL/hr at 08/19/21 1308  ? ?PRN Meds:.HYDROmorphone (DILAUDID) injection, menthol-cetylpyridinium, ondansetron (ZOFRAN) IV, prochlorperazine, simethicone,  traMADol ? ?Assessment/Plan: ?s/p Procedure(s): ?PRIMARY REPAIR OF PERIUMBILICAL INCISIONAL HERNIA ?LAPAROSCOPIC LYSIS OF ADHESIONS AND REPAIR OF ENTEROTOMY 08/19/2021 ? ?Ms. Keagy underwent diagnostic laparotomy with lysis of adhesions and repair of enterotomy with open periumbilical incisional hernia on 08/19/21. ? ? LOS: 0 days  ? ?FEN: ADAT ?ID: None ?VTE: SCDs, lovenox ?Foley: None ?Dispo: Continued care on floor till return of bowel function ? ? ? ?Felicie Morn, MD ? ?[facility] ?Use [email or web address] to contact on call provider ? ?Daily Billing: ?[phone] - post op ? ? ?

## 2021-08-20 NOTE — Plan of Care (Signed)

## 2021-08-21 LAB — BASIC METABOLIC PANEL
Anion gap: 3 — ABNORMAL LOW (ref 5–15)
BUN: 18 mg/dL (ref 8–23)
CO2: 29 mmol/L (ref 22–32)
Calcium: 8.8 mg/dL — ABNORMAL LOW (ref 8.9–10.3)
Chloride: 108 mmol/L (ref 98–111)
Creatinine, Ser: 1.43 mg/dL — ABNORMAL HIGH (ref 0.44–1.00)
GFR, Estimated: 39 mL/min — ABNORMAL LOW (ref >60)
Glucose, Bld: 122 mg/dL — ABNORMAL HIGH (ref 70–99)
Potassium: 4.8 mmol/L (ref 3.5–5.1)
Sodium: 140 mmol/L (ref 135–145)

## 2021-08-21 LAB — CBC
HCT: 33.1 % — ABNORMAL LOW (ref 36.0–46.0)
Hemoglobin: 10.6 g/dL — ABNORMAL LOW (ref 12.0–15.0)
MCH: 31.5 pg (ref 26.0–34.0)
MCHC: 32 g/dL (ref 30.0–36.0)
MCV: 98.2 fL (ref 80.0–100.0)
Platelets: 206 10*3/uL (ref 150–400)
RBC: 3.37 MIL/uL — ABNORMAL LOW (ref 3.87–5.11)
RDW: 15.6 % — ABNORMAL HIGH (ref 11.5–15.5)
WBC: 5.6 10*3/uL (ref 4.0–10.5)
nRBC: 0 % (ref 0.0–0.2)

## 2021-08-21 LAB — GLUCOSE, CAPILLARY
Glucose-Capillary: 87 mg/dL (ref 70–99)
Glucose-Capillary: 114 mg/dL — ABNORMAL HIGH (ref 70–99)
Glucose-Capillary: 109 mg/dL — ABNORMAL HIGH (ref 70–99)
Glucose-Capillary: 157 mg/dL — ABNORMAL HIGH (ref 70–99)

## 2021-08-22 LAB — GLUCOSE, CAPILLARY
Glucose-Capillary: 88 mg/dL (ref 70–99)
Glucose-Capillary: 107 mg/dL — ABNORMAL HIGH (ref 70–99)
Glucose-Capillary: 124 mg/dL — ABNORMAL HIGH (ref 70–99)
Glucose-Capillary: 199 mg/dL — ABNORMAL HIGH (ref 70–99)

## 2021-08-22 MED ORDER — POLYETHYLENE GLYCOL 3350 17 G PO PACK
17.0000 g | PACK | Freq: Two times a day (BID) | ORAL | Status: DC
  Administered 2021-08-22 – 2021-08-24 (×4): 17 g via ORAL
  Filled 2021-08-22 (×4): qty 1

## 2021-08-22 NOTE — Progress Notes (Signed)
Late entry - patient seen yesterday morning ?Progress Note: General Surgery Service  ? ?Chief Complaint/Subjective: ?No major complaints.  No BM yet ? ?Objective: ?Vital signs in last 24 hours: ?Temp:  [97.7 ?F (36.5 ?C)-98.6 ?F (37 ?C)] 97.7 ?F (36.5 ?C) (04/21 0510) ?Pulse Rate:  [62-71] 62 (04/21 0510) ?Resp:  [14-18] 18 (04/21 0510) ?BP: (122-135)/(54-65) 122/65 (04/21 0510) ?SpO2:  [93 %-94 %] 94 % (04/21 0510) ?Last BM Date : 08/18/21 ? ?Intake/Output from previous day: ?04/20 0701 - 04/21 0700 ?In: 960 [P.O.:960] ?Out: 800 [Urine:750; Drains:50] ?Intake/Output this shift: ?No intake/output data recorded. ? ?GI: Abd incisions c/d/I w/ glue, JP serosanguinous ? ?Lab Results: ?CBC  ?Recent Labs  ?  08/19/21 ?1312 08/21/21 ?0438  ?WBC 11.0* 5.6  ?HGB 12.2 10.6*  ?HCT 37.2 33.1*  ?PLT 261 206  ? ? ?BMET ?Recent Labs  ?  08/19/21 ?1312 08/21/21 ?0438  ?NA  --  140  ?K  --  4.8  ?CL  --  108  ?CO2  --  29  ?GLUCOSE  --  122*  ?BUN  --  18  ?CREATININE 1.27* 1.43*  ?CALCIUM  --  8.8*  ? ? ?PT/INR ?No results for input(s): LABPROT, INR in the last 72 hours. ?ABG ?No results for input(s): PHART, HCO3 in the last 72 hours. ? ?Invalid input(s): PCO2, PO2 ? ?Anti-infectives: ?Anti-infectives (From admission, onward)  ? ? Start     Dose/Rate Route Frequency Ordered Stop  ? 08/19/21 0600  clindamycin (CLEOCIN) IVPB 900 mg       ? 900 mg ?100 mL/hr over 30 Minutes Intravenous On call to O.R. 08/19/21 8022 08/19/21 0820  ? ?  ? ? ?Medications: ?Scheduled Meds: ? amLODipine  10 mg Oral Daily  ? And  ? benazepril  40 mg Oral Daily  ? calcium carbonate  1 tablet Oral QAC breakfast  ? docusate sodium  100 mg Oral BID  ? enoxaparin (LOVENOX) injection  40 mg Subcutaneous Q24H  ? insulin aspart  0-15 Units Subcutaneous TID WC  ? insulin aspart  0-5 Units Subcutaneous QHS  ? Vibegron  75 mg Oral Daily  ? ?Continuous Infusions: ? lactated ringers Stopped (08/20/21 1016)  ? ?PRN Meds:.HYDROmorphone (DILAUDID) injection,  menthol-cetylpyridinium, ondansetron (ZOFRAN) IV, prochlorperazine, simethicone, traMADol ? ?Assessment/Plan: ?s/p Procedure(s): ?PRIMARY REPAIR OF PERIUMBILICAL INCISIONAL HERNIA ?LAPAROSCOPIC LYSIS OF ADHESIONS AND REPAIR OF ENTEROTOMY 08/19/2021 ? ?[name] underwent diagnostic laparotomy with lysis of adhesions and repair of enterotomy with open periumbilical incisional hernia on 08/19/21. ? ?Awaiting return of bowel function ? ? LOS: 2 days  ? ?FEN: ADAT ?ID: None ?VTE: SCDs, lovenox ?Foley: None ?Dispo: Continued care on floor till return of bowel function ? ? ? ?Felicie Morn, MD ? ?[facility] ?Use [email or web address] to contact on call provider ? ?Daily Billing: ?[phone] - post op ? ? ?

## 2021-08-22 NOTE — Progress Notes (Signed)
Progress Note: General Surgery Service  ? ?Chief Complaint/Subjective: ?No major complaints.  No BM yet ? ?Objective: ?Vital signs in last 24 hours: ?Temp:  [97.7 ?F (36.5 ?C)-98.6 ?F (37 ?C)] 97.7 ?F (36.5 ?C) (04/21 0510) ?Pulse Rate:  [62-71] 62 (04/21 0510) ?Resp:  [14-18] 18 (04/21 0510) ?BP: (122-135)/(54-65) 122/65 (04/21 0510) ?SpO2:  [93 %-94 %] 94 % (04/21 0510) ?Last BM Date : 08/18/21 ? ?Intake/Output from previous day: ?04/20 0701 - 04/21 0700 ?In: 960 [P.O.:960] ?Out: 800 [Urine:750; Drains:50] ?Intake/Output this shift: ?No intake/output data recorded. ? ?GI: Abd incisions c/d/I w/ glue, JP serosanguinous ? ?Lab Results: ?CBC  ?Recent Labs  ?  08/19/21 ?1312 08/21/21 ?0438  ?WBC 11.0* 5.6  ?HGB 12.2 10.6*  ?HCT 37.2 33.1*  ?PLT 261 206  ? ? ?BMET ?Recent Labs  ?  08/19/21 ?1312 08/21/21 ?0438  ?NA  --  140  ?K  --  4.8  ?CL  --  108  ?CO2  --  29  ?GLUCOSE  --  122*  ?BUN  --  18  ?CREATININE 1.27* 1.43*  ?CALCIUM  --  8.8*  ? ? ?PT/INR ?No results for input(s): LABPROT, INR in the last 72 hours. ?ABG ?No results for input(s): PHART, HCO3 in the last 72 hours. ? ?Invalid input(s): PCO2, PO2 ? ?Anti-infectives: ?Anti-infectives (From admission, onward)  ? ? Start     Dose/Rate Route Frequency Ordered Stop  ? 08/19/21 0600  clindamycin (CLEOCIN) IVPB 900 mg       ? 900 mg ?100 mL/hr over 30 Minutes Intravenous On call to O.R. 08/19/21 [id number] 08/19/21 0820  ? ?  ? ? ?Medications: ?Scheduled Meds: ? amLODipine  10 mg Oral Daily  ? And  ? benazepril  40 mg Oral Daily  ? calcium carbonate  1 tablet Oral QAC breakfast  ? docusate sodium  100 mg Oral BID  ? enoxaparin (LOVENOX) injection  40 mg Subcutaneous Q24H  ? insulin aspart  0-15 Units Subcutaneous TID WC  ? insulin aspart  0-5 Units Subcutaneous QHS  ? Vibegron  75 mg Oral Daily  ? ?Continuous Infusions: ? lactated ringers Stopped (08/20/21 1016)  ? ?PRN Meds:.HYDROmorphone (DILAUDID) injection, menthol-cetylpyridinium, ondansetron (ZOFRAN) IV,  prochlorperazine, simethicone, traMADol ? ?Assessment/Plan: ?s/p Procedure(s): ?PRIMARY REPAIR OF PERIUMBILICAL INCISIONAL HERNIA ?LAPAROSCOPIC LYSIS OF ADHESIONS AND REPAIR OF ENTEROTOMY 08/19/2021 ? ?Ms. Vogelsang underwent diagnostic laparotomy with lysis of adhesions and repair of enterotomy with open periumbilical incisional hernia on 08/19/21. ? ?Awaiting return of bowel function ? ? LOS: 2 days  ? ?FEN: ADAT ?ID: None ?VTE: SCDs, lovenox ?Foley: None ?Dispo: Continued care on floor till return of bowel function ? ? ? ?Felicie Morn, MD ? ?[facility] ?Use [email or web address] to contact on call provider ? ?Daily Billing: ?[phone] - post op ? ? ?

## 2021-08-23 LAB — GLUCOSE, CAPILLARY
Glucose-Capillary: 106 mg/dL — ABNORMAL HIGH (ref 70–99)
Glucose-Capillary: 84 mg/dL (ref 70–99)
Glucose-Capillary: 150 mg/dL — ABNORMAL HIGH (ref 70–99)
Glucose-Capillary: 153 mg/dL — ABNORMAL HIGH (ref 70–99)

## 2021-08-23 MED ORDER — SODIUM CHLORIDE 0.9% FLUSH: 3.0000 mL | INTRAVENOUS | Status: DC | PRN

## 2021-08-23 MED ORDER — MAGIC MOUTHWASH
15.0000 mL | Freq: Four times a day (QID) | ORAL | Status: DC | PRN
  Filled 2021-08-23: qty 15

## 2021-08-23 MED ORDER — BISACODYL 10 MG RE SUPP
10.0000 mg | Freq: Two times a day (BID) | RECTAL | Status: DC | PRN
Start: 2021-08-23 — End: 2021-08-24
  Administered 2021-08-23: 10 mg via RECTAL
  Filled 2021-08-23 (×2): qty 1

## 2021-08-23 MED ORDER — PHENOL 1.4 % MT LIQD: 2.0000 | OROMUCOSAL | Status: DC | PRN

## 2021-08-23 MED ORDER — SODIUM CHLORIDE 0.9% FLUSH
3.0000 mL | Freq: Two times a day (BID) | INTRAVENOUS | Status: DC
  Administered 2021-08-23 – 2021-08-24 (×3): 3 mL via INTRAVENOUS

## 2021-08-23 MED ORDER — VITAMIN D 25 MCG (1000 UNIT) PO TABS
1000.0000 [IU] | ORAL_TABLET | Freq: Every day | ORAL | Status: DC
  Administered 2021-08-23 – 2021-08-24 (×2): 1000 [IU] via ORAL
  Filled 2021-08-23 (×2): qty 1

## 2021-08-23 MED ORDER — ALUM & MAG HYDROXIDE-SIMETH 200-200-20 MG/5ML PO SUSP: 30.0000 mL | Freq: Four times a day (QID) | ORAL | Status: DC | PRN

## 2021-08-23 MED ORDER — DIPHENHYDRAMINE HCL 50 MG/ML IJ SOLN: 12.5000 mg | Freq: Four times a day (QID) | INTRAMUSCULAR | Status: DC | PRN

## 2021-08-23 MED ORDER — METHOCARBAMOL 500 MG PO TABS
1000.0000 mg | ORAL_TABLET | Freq: Four times a day (QID) | ORAL | Status: DC | PRN
Start: 2021-08-23 — End: 2021-08-24

## 2021-08-23 MED ORDER — TRAMADOL HCL 50 MG PO TABS: 100.0000 mg | ORAL_TABLET | Freq: Four times a day (QID) | ORAL | 0 refills | Status: AC | PRN

## 2021-08-23 MED ORDER — LIP MEDEX EX OINT
1.0000 "application " | TOPICAL_OINTMENT | Freq: Two times a day (BID) | CUTANEOUS | Status: DC
  Administered 2021-08-23 – 2021-08-24 (×3): 1 via TOPICAL
  Filled 2021-08-23: qty 7

## 2021-08-23 MED ORDER — METFORMIN HCL ER 500 MG PO TB24
1000.0000 mg | ORAL_TABLET | Freq: Every day | ORAL | Status: DC
  Administered 2021-08-24: 1000 mg via ORAL
  Filled 2021-08-23: qty 2

## 2021-08-23 MED ORDER — SODIUM CHLORIDE 0.9 % IV SOLN: 250.0000 mL | INTRAVENOUS | Status: DC | PRN

## 2021-08-23 MED ORDER — LACTATED RINGERS IV BOLUS: 1000.0000 mL | Freq: Three times a day (TID) | INTRAVENOUS | Status: DC | PRN

## 2021-08-23 NOTE — Progress Notes (Signed)
4 Days Post-Op  ? ?Subjective/Chief Complaint: ?No complaints other than no bm yet. She is passing flatus and tolerating diet ? ? ?Objective: ?Vital signs in last 24 hours: ?Temp:  [98.1 ?F (36.7 ?C)-98.6 ?F (37 ?C)] 98.1 ?F (36.7 ?C) (04/22 0454) ?Pulse Rate:  [66-70] 70 (04/22 0454) ?Resp:  [14-16] 16 (04/22 0454) ?BP: (138-139)/(60-63) 138/60 (04/22 0454) ?SpO2:  [97 %-98 %] 98 % (04/22 0454) ?Last BM Date : 08/18/21 ? ?Intake/Output from previous day: ?04/21 0701 - 04/22 0700 ?In: 680 [P.O.:680] ?Out: 9528 [Urine:1100; Drains:35] ?Intake/Output this shift: ?No intake/output data recorded. ? ?General appearance: alert and cooperative ?Resp: clear to auscultation bilaterally ?Cardio: regular rate and rhythm ?GI: soft, mild tenderness ? ?Lab Results:  ?Recent Labs  ?  08/21/21 ?4132  ?WBC 5.6  ?HGB 10.6*  ?HCT 33.1*  ?PLT 206  ? ?BMET ?Recent Labs  ?  08/21/21 ?0438  ?NA 140  ?K 4.8  ?CL 108  ?CO2 29  ?GLUCOSE 122*  ?BUN 18  ?CREATININE 1.43*  ?CALCIUM 8.8*  ? ?PT/INR ?No results for input(s): LABPROT, INR in the last 72 hours. ?ABG ?No results for input(s): PHART, HCO3 in the last 72 hours. ? ?Invalid input(s): PCO2, PO2 ? ?Studies/Results: ?No results found. ? ?Anti-infectives: ?Anti-infectives (From admission, onward)  ? ? Start     Dose/Rate Route Frequency Ordered Stop  ? 08/19/21 0600  clindamycin (CLEOCIN) IVPB 900 mg       ? 900 mg ?100 mL/hr over 30 Minutes Intravenous On call to O.R. 08/19/21 4401 08/19/21 0820  ? ?  ? ? ?Assessment/Plan: ?s/p Procedure(s): ?PRIMARY REPAIR OF PERIUMBILICAL INCISIONAL HERNIA (N/A) ?LAPAROSCOPIC LYSIS OF ADHESIONS AND REPAIR OF ENTEROTOMY (N/A) ?Advance diet ?Continue colace and miralax ?Plan for d/c once she has a bm ?VTE: lovenox, scd ?ambulate ? LOS: 3 days  ? ? ?Christina [name] ?08/23/2021 ? ?

## 2021-08-23 NOTE — Discharge Instructions (Addendum)
HERNIA REPAIR: POST OP INSTRUCTIONS ? ?###################################################################### ? ?EAT ?Gradually transition to a high fiber diet with a fiber supplement over the next few weeks after discharge.  Start with a pureed / full liquid diet (see below) ? ?WALK ?Walk an hour a day.  Control your pain to do that.   ? ?CONTROL PAIN ?Control pain so that you can walk, sleep, tolerate sneezing/coughing, and go up/down stairs. ? ?HAVE A BOWEL MOVEMENT DAILY ?Keep your bowels regular to avoid problems.  OK to try a laxative to override constipation.  OK to use an antidairrheal to slow down diarrhea.  Call if not better after 2 tries ? ?CALL IF YOU HAVE PROBLEMS/CONCERNS ?Call if you are still struggling despite following these instructions. ?Call if you have concerns not answered by these instructions ? ?###################################################################### ? ? ? ?DIET: Follow a light bland diet & liquids the first 24 hours after arrival home, such as soup, liquids, starches, etc.  Be sure to drink plenty of fluids.  Quickly advance to a usual solid diet within a few days.  Avoid fast food or heavy meals as your are more likely to get nauseated or have irregular bowels.  A low-fat, high-fiber diet for the rest of your life is ideal.  ? ?Take your usually prescribed home medications unless otherwise directed. ? ?PAIN CONTROL: ?Pain is best controlled by a usual combination of three different methods TOGETHER: ?Ice/Heat ?Over the counter pain medication ?Prescription pain medication ?Most patients will experience some swelling and bruising around the hernia(s) such as the bellybutton, groins, or old incisions.  Ice packs or heating pads (30-60 minutes up to 6 times a day) will help. Use ice for the first few days to help decrease swelling and bruising, then switch to heat to help relax tight/sore spots and speed recovery.  Some people prefer to use ice alone, heat alone, alternating  between ice & heat.  Experiment to what works for you.  Swelling and bruising can take several weeks to resolve.   ?It is helpful to take an over-the-counter pain medication regularly for the first few weeks.  Choose one of the following that works best for you: ?Naproxen (Aleve, etc)  Two 274m tabs twice a day ?Ibuprofen (Advil, etc) Three 2015mtabs four times a day (every meal & bedtime) ?Acetaminophen (Tylenol, etc) 325-65029mour times a day (every meal & bedtime) ?A  prescription for pain medication should be given to you upon discharge.  Take your pain medication as prescribed.  ?If you are having problems/concerns with the prescription medicine (does not control pain, nausea, vomiting, rash, itching, etc), please call us [phone] see if we need to switch you to a different pain medicine that will work better for you and/or control your side effect better. ?If you need a refill on your pain medication, please contact your pharmacy.  They will contact our office to request authorization. Prescriptions will not be filled after 5 pm or on week-ends. ? ?Avoid getting constipated.  Between the surgery and the pain medications, it is common to experience some constipation.  Increasing fluid intake and taking a fiber supplement (such as Metamucil, Citrucel, FiberCon, MiraLax, etc) 1-2 times a day regularly will usually help prevent this problem from occurring.  A mild laxative (prune juice, Milk of Magnesia, MiraLax, etc) should be taken according to package directions if there are no bowel movements after 48 hours.   ? ?Wash / shower every day.  You may shower over the dressings  as they are waterproof.   ? ?Remove your waterproof bandages, skin tapes, and other bandages 3 days after surgery. You may replace a dressing/Band-Aid to cover the incision for comfort if you wish. You may leave the incisions open to air.  You may replace a dressing/Band-Aid to cover an incision for comfort if you wish.  Continue  to shower over incision(s) after the dressing is off. ? ?ACTIVITIES as tolerated:   ?You may resume regular (light) daily activities beginning the next day--such as daily self-care, walking, climbing stairs--gradually increasing activities as tolerated.  Control your pain so that you can walk an hour a day.  If you can walk 30 minutes without difficulty, it is safe to try more intense activity such as jogging, treadmill, bicycling, low-impact aerobics, swimming, etc. ?Save the most intensive and strenuous activity for last such as sit-ups, heavy lifting, contact sports, etc  Refrain from any heavy lifting or straining until you are off narcotics for pain control.   ?DO NOT PUSH THROUGH PAIN.  Let pain be your guide: If it hurts to do something, don't do it.  Pain is your body warning you to avoid that activity for another week until the pain goes down. ?You may drive when you are no longer taking prescription pain medication, you can comfortably wear a seatbelt, and you can safely maneuver your car and apply brakes. ?You may have sexual intercourse when it is comfortable.  ? ?FOLLOW UP in our office ?Please call [facility] at [phone] to set up an appointment to see your surgeon in the office for a follow-up appointment approximately 2-3 weeks after your surgery. ?Make sure that you call for this appointment the day you arrive home to insure a convenient appointment time. ? ?9.  If you have disability of FMLA / Family leave forms, please bring the forms to the office for processing.  (do not give to your surgeon). ? ?WHEN TO CALL us [phone]: ?Poor pain control ?Reactions / problems with new medications (rash/itching, nausea, etc)  ?Fever over 101.5 F (38.5 C) ?Inability to urinate ?Nausea and/or vomiting ?Worsening swelling or bruising ?Continued bleeding from incision. ?Increased pain, redness, or drainage from the incision ? ? The clinic staff is available to answer your questions during regular business  hours (8:30am-5pm).  Please don?t hesitate to call and ask to speak to one of our nurses for clinical concerns.  ? If you have a medical emergency, go to the nearest emergency room or call 911. ? A surgeon from [facility] is always on call at the hospitals in [place] ? ?[facility], [place] ?[address], [place], [place], [place]  [address] ? ? [address] [place], [place], [place]   [address] ?MAIN: [phone] ? TOLL FREE: [phone] ? FAX: [phone] ?[email or web address]  ? ? ?########################################################################################### ? ?DRAIN CARE:   ?You have a closed bulb drain to help you heal. ? ? ? ?A bulb drain is a small, plastic reservoir which creates a gentle suction. It is used to remove excess fluid from a surgical wound. The color and amount of fluid will vary. Immediately after surgery, the fluid is bright red. It may gradually change to a yellow color. When the amount decreases to about 1 or 2 tablespoons (15 to 30 cc) per 24 hours, your caregiver will usually remove it. ? ?JP Care ? ?The Jackson-Pratt drainage system has flexible tubing attached to a soft, plastic bulb with a stopper. The drainage end of the  tubing, which is flat and white, goes into your body through a small opening near your incision (surgical cut). A stitch holds the drainage end in place. The rest of the tube is outside your body, attached to the bulb. When the bulb is compressed with the stopper in place, it creates a vacuum. This causes a constant gentle suction, which helps draw out fluid that collects under your incision. The bulb should be compressed at all times, except when you are emptying the drainage. ? ?How long you will have your Jackson-Pratt depends on your surgery and the amount of fluid is draining. This is different for everyone. The Jackson-Pratt is usually removed when the drainage is 30 mL or less over 24 hours. To keep track of  how much drainage you?re having, you will record the amount in a drainage log. It?s important to bring the log with you to your follow-up appointments. ? ?Caring for Your Jackson-Pratt at Home ?In order t

## 2021-08-24 DIAGNOSIS — K644 Residual hemorrhoidal skin tags: Secondary | ICD-10-CM

## 2021-08-24 LAB — GLUCOSE, CAPILLARY
Glucose-Capillary: 105 mg/dL — ABNORMAL HIGH (ref 70–99)
Glucose-Capillary: 121 mg/dL — ABNORMAL HIGH (ref 70–99)

## 2021-08-24 MED ORDER — HYDROCORTISONE (PERIANAL) 2.5 % EX CREA: TOPICAL_CREAM | Freq: Two times a day (BID) | CUTANEOUS | 2 refills | Status: AC

## 2021-08-24 NOTE — Progress Notes (Signed)
?  Subjective:  ?Patient ID: Christina [name], female    DOB: [birth year],  MRN: [id number] ? ?Christina [name] presents to clinic today for preventative diabetic foot care and callus(es) b/l lower extremities and painful thick toenails that are difficult to trim. Painful toenails interfere with ambulation. Aggravating factors include wearing enclosed shoe gear. Pain is relieved with periodic professional debridement. Painful calluses are aggravated when weightbearing with and without shoegear. Pain is relieved with periodic professional debridement. ? ?Last HgA1c was 6.0%. Patient did not check blood glucose today; states she hasn't been taking it lately. ? ?New problem(s): None.  ? ?She is scheduled to have hernia repair on tomorrow. ? ?PCP is [name], MD , and last visit was August 11, 2021. ? ?Allergies  ?Allergen Reactions  ? Penicillins Dermatitis and Rash  ?  Tolerated Cephalosporin Date: 04/01/21. ?rash/ skin peel/ and nails come off  ? Sulfa Antibiotics Nausea And Vomiting  ?  Muscle Cramps  ? Cholestyramine Diarrhea  ? Gemfibrozil Diarrhea and Nausea And Vomiting  ? Statins   ?  Cramps, muscle pain  ? ? ?Review of Systems: Negative except as noted in the HPI. ? ?Objective:  ? ?Christina [name] is a pleasant 71 y.o. female in NAD. AAO X 3. ? ?Vascular Examination: ?CFT immediate b/l LE. Palpable DP/PT pulses b/l LE. Digital hair sparse b/l. Skin temperature gradient WNL b/l. No pain with calf compression b/l. Trace edema b/l LE. No cyanosis or clubbing noted b/l LE. ? ?Dermatological Examination: ?Pedal integument with normal turgor, texture and tone b/l LE. No open wounds b/l. No interdigital macerations b/l. Toenails 1-5 b/l elongated, thickened, discolored with subungual debris. +Tenderness with dorsal palpation of nailplates. Hyperkeratotic lesion(s) noted and submet head 5 b/l. ? ?Musculoskeletal Examination: ?Normal muscle strength 5/5 to all lower extremity muscle groups bilaterally. Hammertoe(s) noted  to the L 5th toe and R 5th toe. No pain, crepitus or joint limitation noted with ROM b/l LE.  Patient ambulates independently without assistive aids. ? ?Neurological Examination: ?Protective sensation intact 5/5 intact bilaterally with 10g monofilament b/l. Vibratory sensation intact b/l. ? ? ?  Latest Ref Rng & Units 07/22/2021  ?  9:29 AM 03/24/2021  ? 11:30 AM  ?Hemoglobin A1C  ?Hemoglobin-A1c 4.8 - 5.6 % 6.0   6.1    ? ?Assessment/Plan: ?1. Pain due to onychomycosis of toenails of both feet   ?2. Callus   ?3. Controlled type 2 diabetes mellitus without complication, without long-term current use of insulin ([place])   ?  ?-Examined patient. ?-Continue foot and shoe inspections daily. Monitor blood glucose per PCP/Endocrinologist's recommendations. ?-Toenails 1-5 b/l were debrided in length and girth with sterile nail nippers and dremel without iatrogenic bleeding.  ?-Callus(es) submet head 5 b/l pared utilizing sterile scalpel blade without complication or incident. Total number debrided =2. ?-Patient/POA to call should there be question/concern in the interim.  ? ?Return in about 3 months (around 11/17/2021). ? ?Marzetta Board, DPM  ?

## 2021-08-24 NOTE — Discharge Summary (Signed)
?Physician Discharge Summary  ? ? ?Patient ID: ?Christina [name] ?MRN: [id number] ?DOB/AGE: 1951-03-15  ?71 y.o. ? ?Patient Care Team: ?[name], MD as PCP - General (Family Medicine) ?Breck Coons, MD as Referring Physician (Internal Medicine) ?Stechschulte, Nickola Major, MD as Consulting Physician (Surgery) ? ?Admit date: 08/19/2021 ? ?Discharge date: .08/24/2021 ? ?Hospital Stay = 4 days ? ? ? ?Discharge Diagnoses:  ?Principal Problem: ?  Incisional hernia ?Active Problems: ?  Hypertension ?  External hemorrhoids ? ? ?5 Days Post-Op  08/19/2021 ? ?Postoperative Diagnosis:  ?PERIUMBILICAL INCARCERATED INCISIONAL HERNIA WITH TWO SMALL HERNIA DEFECTS MEASURING 2CM TALL X 4 CM WIDE ?ENTEROTOMY CREATED IN HEPATIC FLEXURE OF THE COLON ?  ?  ?Surgical Procedure:  ?PRIMARY REPAIR OF INCARCERATED PERIUMBILICAL INCISIONAL HERNIA MEASURING 2CM TALL BY 4 CM WIDE ?LAPAROSCOPIC REPAIR OF ENTEROTOMY IN HEPATIC FLEXURE OF THE COLON ?LAPAROSCOPIC LYSIS OF ADHESIONS ?  ?  ?Operative Team Members:  ?Surgeon(s)  Stechschulte, Nickola Major, MD - Primary  ?  ? ?Consults: NONE ? ?Hospital Course:  ? ?The patient underwent the surgery above.  Postoperatively, the patient gradually mobilized and advanced to a solid diet.  Pain and other symptoms were treated aggressively.   ? ?By the time of discharge, the patient was walking well the hallways, eating food, having flatus.  Pain was well-controlled on an oral medications.  Based on meeting discharge criteria and continuing to recover, I felt it was safe for the patient to be discharged from the hospital to further recover with close followup. Postoperative recommendations were discussed in detail.  They are written as well. ? ?Discharged Condition: good ? ?Discharge Exam: ?Blood pressure 136/69, pulse 69, temperature 97.8 ?F (36.6 ?C), temperature source Oral, resp. rate 20, height '5\' 2"'$  (1.575 m), weight 93.4 kg, SpO2 99 %. ? ?General: Pt awake/alert/oriented x4 in No acute distress ?Eyes:  PERRL, normal EOM.  Sclera clear.  No icterus ?Neuro: CN II-XII intact w/o focal sensory/motor deficits. ?Lymph: No head/neck/groin lymphadenopathy ?Psych:  No delerium/psychosis/paranoia ?HENT: Normocephalic, Mucus membranes moist.  No thrush ?Neck: Supple, No tracheal deviation ?Chest:  No chest wall pain w good excursion ?CV:  Pulses intact.  Regular rhythm ?MS: Normal AROM mjr joints.  No obvious deformity ?Abdomen: Soft.  Nondistended.  Mildly tender at incisions only.  Incision c/d/I.  No evidence of peritonitis.  No incarcerated hernias. ?Ext:  SCDs BLE.  No mjr edema.  No cyanosis ?Skin: No petechiae / purpura ? ? ?Disposition:  ? ? Follow-up Information   ? ? Stechschulte, Nickola Major, MD Follow up in 3 week(s).   ?Specialty: Surgery ?Why: To follow up after your operation ?Contact information: ?[address] ?[address] ?[place] [address] ?[phone] ? ? ?  ?  ? ? [facility] Follow up in 1 week(s).   ?Specialty: General Surgery ?Why: To have your drain removed & incisions re-checked ?Contact information: ?[address] ?[address] ?[place] ?[phone] ? ?  ?  ? ?  ?  ? ?  ? ? ?Discharge disposition: 01-Home or Self Care ? ? ? ? ? ? ?Discharge Instructions   ? ? Call MD for:   Complete by: As directed ?  ? FEVER > 101.5 F  ?(temperatures < 101.5 F are not significant)  ? Call MD for:   Complete by: As directed ?  ? FEVER > 101.5 F  ?(temperatures < 101.5 F are not significant)  ? Call MD for:  extreme fatigue  Complete by: As directed ?  ? Call MD for:  extreme fatigue   Complete by: As directed ?  ? Call MD for:  persistant dizziness or light-headedness   Complete by: As directed ?  ? Call MD for:  persistant dizziness or light-headedness   Complete by: As directed ?  ? Call MD for:  persistant nausea and vomiting   Complete by: As directed ?  ? Call MD for:  persistant nausea and vomiting   Complete by: As directed ?  ? Call MD for:  redness,  tenderness, or signs of infection (pain, swelling, redness, odor or green/yellow discharge around incision site)   Complete by: As directed ?  ? Call MD for:  redness, tenderness, or signs of infection (pain, swelling, redness, odor or green/yellow discharge around incision site)   Complete by: As directed ?  ? Call MD for:  severe uncontrolled pain   Complete by: As directed ?  ? Call MD for:  severe uncontrolled pain   Complete by: As directed ?  ? Diet - low sodium heart healthy   Complete by: As directed ?  ? Start with a bland diet such as soups, liquids, starchy foods, low fat foods, etc. the first few days at home. ?Gradually advance to a solid, low-fat, high fiber diet by the end of the first week at home.   ?Add a fiber supplement to your diet (Metamucil, etc) ?If you feel full, bloated, or constipated, stay on a full liquid or pureed/blenderized diet for a few days until you feel better and are no longer constipated.  ? Discharge instructions   Complete by: As directed ?  ? See Discharge Instructions ?If you are not getting better after two weeks or are noticing you are getting worse, contact our office [phone] for further advice.  We may need to adjust your medications, re-evaluate you in the office, send you to the emergency room, or see what other things we can do to help. ?The clinic staff is available to answer your questions during regular business hours (8:30am-5pm).  Please don't hesitate to call and ask to speak to one of our nurses for clinical concerns.    ?A surgeon from [facility] is always on call at the hospitals 24 hours/day ?If you have a medical emergency, go to the nearest emergency room or call 911.  ? Discharge instructions   Complete by: As directed ?  ? See Discharge Instructions ?If you are not getting better after two weeks or are noticing you are getting worse, contact our office [phone] for further advice.  We may need to adjust your medications,  re-evaluate you in the office, send you to the emergency room, or see what other things we can do to help. ?The clinic staff is available to answer your questions during regular business hours (8:30am-5pm).  Please don't hesitate to call and ask to speak to one of our nurses for clinical concerns.    ?A surgeon from [facility] is always on call at the hospitals 24 hours/day ?If you have a medical emergency, go to the nearest emergency room or call 911.  ? Discharge wound care:   Complete by: As directed ?  ? It is good for closed incisions and even open wounds to be washed every day.  Shower every day.  Short baths are fine.  Wash the incisions and wounds clean with soap & water.    ?You may leave closed incisions  open to air if it is dry.   You may cover the incision with clean gauze & replace it after your daily shower for comfort. ? ?DRAIN:  You have a drain in place.  Every day change the dressing in the shower, wash around the skin exit site with soap & water and place a new dressing of gauze or band aid around the skin every day.  Keep the drain site clean & dry.  Follow up in our surgery office to discuss plans for drain removal in the near future  ? Discharge wound care:   Complete by: As directed ?  ? It is good for closed incisions and even open wounds to be washed every day.  Shower every day.  Short baths are fine.  Wash the incisions and wounds clean with soap & water.    ?You may leave closed incisions open to air if it is dry.   You may cover the incision with clean gauze & replace it after your daily shower for comfort. ? ?DERMABOND:  You have purple skin glue (Dermabond) on your incision(s).  Leave them in place, and they will fall off on their own like a scab in 2-3 weeks.  You may trim any edges that curl up with clean scissors.  ? Driving Restrictions   Complete by: As directed ?  ? You may drive when: ?- you are no longer taking narcotic prescription pain medication ?- you can  comfortably wear a seatbelt ?- you can safely make sudden turns/stops without pain.  ? Driving Restrictions   Complete by: As directed ?  ? You may drive when: ?- you are no longer taking narcotic prescription pain

## 2021-08-24 NOTE — Progress Notes (Signed)
Pt gave good return demonstration of how to empty JP, recharge it, record amt and change the dsg over the site. Supplies given to pt and questions answered. Reviewed written instructions w pt and all questions answered. She verbalized understanding. D/C via w/c w all belongings in stable condition. ?

## 2021-09-26 DIAGNOSIS — Z96652 Presence of left artificial knee joint: Secondary | ICD-10-CM | POA: Diagnosis not present

## 2021-10-04 ENCOUNTER — Ambulatory Visit
Admission: RE | Admit: 2021-10-04 | Discharge: 2021-10-04 | Disposition: A | Discharge status: Home / Self Care | Payer: Medicare HMO | Source: Ambulatory Visit | Attending: Family Medicine | Admitting: Family Medicine

## 2021-10-04 DIAGNOSIS — N281 Cyst of kidney, acquired: Secondary | ICD-10-CM | POA: Diagnosis not present

## 2021-10-04 DIAGNOSIS — K862 Cyst of pancreas: Secondary | ICD-10-CM

## 2021-10-04 DIAGNOSIS — K7689 Other specified diseases of liver: Secondary | ICD-10-CM | POA: Diagnosis not present

## 2021-10-04 MED ORDER — GADOBENATE DIMEGLUMINE 529 MG/ML IV SOLN
14.0000 mL | Freq: Once | INTRAVENOUS | Status: AC | PRN
  Administered 2021-10-04: 19 mL via INTRAVENOUS

## 2021-11-19 ENCOUNTER — Ambulatory Visit: Payer: Medicare HMO | Admitting: Podiatry

## 2021-11-19 ENCOUNTER — Encounter: Payer: Self-pay | Admitting: Podiatry

## 2021-11-19 DIAGNOSIS — L84 Corns and callosities: Secondary | ICD-10-CM

## 2021-11-19 DIAGNOSIS — M2012 Hallux valgus (acquired), left foot: Secondary | ICD-10-CM | POA: Diagnosis not present

## 2021-11-19 DIAGNOSIS — M2041 Other hammer toe(s) (acquired), right foot: Secondary | ICD-10-CM

## 2021-11-19 DIAGNOSIS — E119 Type 2 diabetes mellitus without complications: Secondary | ICD-10-CM

## 2021-11-19 DIAGNOSIS — B351 Tinea unguium: Secondary | ICD-10-CM

## 2021-11-19 DIAGNOSIS — M2011 Hallux valgus (acquired), right foot: Secondary | ICD-10-CM

## 2021-11-19 DIAGNOSIS — M79675 Pain in left toe(s): Secondary | ICD-10-CM | POA: Diagnosis not present

## 2021-11-19 DIAGNOSIS — M79674 Pain in right toe(s): Secondary | ICD-10-CM

## 2021-11-19 DIAGNOSIS — M2042 Other hammer toe(s) (acquired), left foot: Secondary | ICD-10-CM | POA: Diagnosis not present

## 2021-11-26 NOTE — Progress Notes (Signed)
Subjective:  Patient ID: Christina [name], female    DOB: [birth year],  MRN: [id number]  Courtny Bennison presents to clinic today for preventative diabetic foot care and callus(es) b/l lower extremities and painful thick toenails that are difficult to trim. Painful toenails interfere with ambulation. Aggravating factors include wearing enclosed shoe gear. Pain is relieved with periodic professional debridement. Painful calluses are aggravated when weightbearing with and without shoegear. Pain is relieved with periodic professional debridement.  Last known HgA1c was unknown.  Patient did not check blood glucose today.  New problem(s):   Patient concerned of bunion deformity b/l as well as contracted 5th toes. Bunions have increased and become symptomatic from time to time. She has attempted shoe gear change.   She is recovering from knee surgery LLE which was performed in April.  PCP is [name], MD , and last visit was  Sep 02, 2021  Allergies  Allergen Reactions   Penicillins Dermatitis and Rash    Tolerated Cephalosporin Date: 04/01/21. rash/ skin peel/ and nails come off Other reaction(s): rash/ skin peel/ and nails come off   Rosuvastatin     Cramps (ALLERGY/intolerance), muscle pain Other reaction(s): muscle pain   Sulfa Antibiotics Nausea And Vomiting    Muscle Cramps   Cholestyramine Diarrhea   Gemfibrozil Diarrhea and Nausea And Vomiting   Statins     Cramps, muscle pain    Review of Systems: Negative except as noted in the HPI.  Objective: No changes noted in today's physical examination.  Christina [name] is a pleasant 71 y.o. female in NAD. AAO X 3.  Vascular Examination: CFT immediate b/l LE. Palpable DP/PT pulses b/l LE. Digital hair sparse b/l. Skin temperature gradient WNL b/l. No pain with calf compression b/l. Trace edema b/l LE. No cyanosis or clubbing noted b/l LE.  Dermatological Examination: Pedal integument with normal turgor, texture and tone b/l LE. No  open wounds b/l. No interdigital macerations b/l. Toenails 1-5 b/l elongated, thickened, discolored with subungual debris. +Tenderness with dorsal palpation of nailplates. Hyperkeratotic lesion(s) noted and submet head 5 b/l and plantar IPJ right great toe.  Musculoskeletal Examination: Normal muscle strength 5/5 to all lower extremity muscle groups bilaterally. Hammertoe(s) noted to the L 5th toe and R 5th toe. HAV with bunion deformity noted b/l LE. Patient ambulates with cane assistance today.  Neurological Examination: Protective sensation intact 5/5 intact bilaterally with 10g monofilament b/l. Vibratory sensation intact b/l.    Latest Ref Rng & Units 07/22/2021    9:29 AM 03/24/2021   11:30 AM  Hemoglobin A1C  Hemoglobin-A1c 4.8 - 5.6 % 6.0  6.1    Assessment/Plan: 1. Pain due to onychomycosis of toenails of both feet   2. Callus   3. Hallux valgus, acquired, bilateral   4. Acquired hammertoes of both feet   5. Controlled type 2 diabetes mellitus without complication, without long-term current use of insulin ([place])     -Patient was evaluated and treated. All patient's and/or POA's questions/concerns answered on today's visit. -We did discuss her bilateral bunion and hammertoe deformities and she would like to discuss other treatment options available. Discussed wider shoe gear and shoes with high toe box. Referred to Dr. Sherryle Lis for evaluation of her bunion and hammertoe defromities. -Patient to continue soft, supportive shoe gear daily. -Toenails 1-5 b/l were debrided in length and girth with sterile nail nippers and dremel without iatrogenic bleeding.  -Callus(es) plantar IPJ of right great toe and submet head 5 b/l pared utilizing sterile scalpel  blade without complication or incident. Total number debrided =3. -Patient/POA to call should there be question/concern in the interim.   Return in about 3 months (around 02/19/2022).  Marzetta Board, DPM

## 2021-12-01 DIAGNOSIS — E1169 Type 2 diabetes mellitus with other specified complication: Secondary | ICD-10-CM | POA: Diagnosis not present

## 2021-12-01 DIAGNOSIS — I10 Essential (primary) hypertension: Secondary | ICD-10-CM | POA: Diagnosis not present

## 2021-12-01 DIAGNOSIS — Z6841 Body Mass Index (BMI) 40.0 and over, adult: Secondary | ICD-10-CM | POA: Diagnosis not present

## 2021-12-01 DIAGNOSIS — R718 Other abnormality of red blood cells: Secondary | ICD-10-CM | POA: Diagnosis not present

## 2021-12-01 DIAGNOSIS — R710 Precipitous drop in hematocrit: Secondary | ICD-10-CM | POA: Diagnosis not present

## 2021-12-04 ENCOUNTER — Encounter (HOSPITAL_BASED_OUTPATIENT_CLINIC_OR_DEPARTMENT_OTHER): Payer: Self-pay | Admitting: Emergency Medicine

## 2021-12-04 ENCOUNTER — Other Ambulatory Visit: Payer: Self-pay

## 2021-12-04 ENCOUNTER — Emergency Department (HOSPITAL_BASED_OUTPATIENT_CLINIC_OR_DEPARTMENT_OTHER): Payer: Medicare HMO

## 2021-12-04 ENCOUNTER — Emergency Department (HOSPITAL_BASED_OUTPATIENT_CLINIC_OR_DEPARTMENT_OTHER)
Admission: EM | Admit: 2021-12-04 | Discharge: 2021-12-04 | Disposition: A | Discharge status: Home / Self Care | Payer: Medicare HMO | Attending: Emergency Medicine | Admitting: Emergency Medicine

## 2021-12-04 ENCOUNTER — Ambulatory Visit: Payer: Medicare HMO | Admitting: Podiatry

## 2021-12-04 DIAGNOSIS — Z79899 Other long term (current) drug therapy: Secondary | ICD-10-CM | POA: Insufficient documentation

## 2021-12-04 DIAGNOSIS — Z7984 Long term (current) use of oral hypoglycemic drugs: Secondary | ICD-10-CM | POA: Diagnosis not present

## 2021-12-04 DIAGNOSIS — I10 Essential (primary) hypertension: Secondary | ICD-10-CM | POA: Insufficient documentation

## 2021-12-04 DIAGNOSIS — K529 Noninfective gastroenteritis and colitis, unspecified: Secondary | ICD-10-CM | POA: Insufficient documentation

## 2021-12-04 DIAGNOSIS — R109 Unspecified abdominal pain: Secondary | ICD-10-CM | POA: Diagnosis present

## 2021-12-04 DIAGNOSIS — E119 Type 2 diabetes mellitus without complications: Secondary | ICD-10-CM | POA: Insufficient documentation

## 2021-12-04 DIAGNOSIS — K6389 Other specified diseases of intestine: Secondary | ICD-10-CM | POA: Diagnosis not present

## 2021-12-04 DIAGNOSIS — K5730 Diverticulosis of large intestine without perforation or abscess without bleeding: Secondary | ICD-10-CM | POA: Diagnosis not present

## 2021-12-04 LAB — CBC WITH DIFFERENTIAL/PLATELET
Abs Immature Granulocytes: 0.01 10*3/uL (ref 0.00–0.07)
Basophils Absolute: 0 10*3/uL (ref 0.0–0.1)
Basophils Relative: 0 %
Eosinophils Absolute: 0.1 10*3/uL (ref 0.0–0.5)
Eosinophils Relative: 1 %
HCT: 39.9 % (ref 36.0–46.0)
Hemoglobin: 13.2 g/dL (ref 12.0–15.0)
Immature Granulocytes: 0 %
Lymphocytes Relative: 13 %
Lymphs Abs: 0.8 10*3/uL (ref 0.7–4.0)
MCH: 32 pg (ref 26.0–34.0)
MCHC: 33.1 g/dL (ref 30.0–36.0)
MCV: 96.6 fL (ref 80.0–100.0)
Monocytes Absolute: 0.5 10*3/uL (ref 0.1–1.0)
Monocytes Relative: 7 %
Neutro Abs: 5.3 10*3/uL (ref 1.7–7.7)
Neutrophils Relative %: 79 %
Platelets: 246 10*3/uL (ref 150–400)
RBC: 4.13 MIL/uL (ref 3.87–5.11)
RDW: 14.7 % (ref 11.5–15.5)
WBC: 6.7 10*3/uL (ref 4.0–10.5)
nRBC: 0 % (ref 0.0–0.2)

## 2021-12-04 LAB — COMPREHENSIVE METABOLIC PANEL
ALT: 16 U/L (ref 0–44)
AST: 18 U/L (ref 15–41)
Albumin: 3.9 g/dL (ref 3.5–5.0)
Alkaline Phosphatase: 58 U/L (ref 38–126)
Anion gap: 9 (ref 5–15)
BUN: 19 mg/dL (ref 8–23)
CO2: 25 mmol/L (ref 22–32)
Calcium: 9 mg/dL (ref 8.9–10.3)
Chloride: 109 mmol/L (ref 98–111)
Creatinine, Ser: 1.19 mg/dL — ABNORMAL HIGH (ref 0.44–1.00)
GFR, Estimated: 49 mL/min — ABNORMAL LOW (ref >60)
Glucose, Bld: 110 mg/dL — ABNORMAL HIGH (ref 70–99)
Potassium: 3.7 mmol/L (ref 3.5–5.1)
Sodium: 143 mmol/L (ref 135–145)
Total Bilirubin: 0.4 mg/dL (ref 0.3–1.2)
Total Protein: 6.6 g/dL (ref 6.5–8.1)

## 2021-12-04 LAB — LIPASE, BLOOD: Lipase: 18 U/L (ref 11–51)

## 2021-12-04 MED ORDER — METRONIDAZOLE 500 MG PO TABS: 500.0000 mg | ORAL_TABLET | Freq: Two times a day (BID) | ORAL | 0 refills | Status: AC

## 2021-12-04 MED ORDER — SODIUM CHLORIDE 0.9 % IV BOLUS
1000.0000 mL | Freq: Once | INTRAVENOUS | Status: AC
  Administered 2021-12-04: 1000 mL via INTRAVENOUS

## 2021-12-04 MED ORDER — ONDANSETRON HCL 4 MG PO TABS: 4.0000 mg | ORAL_TABLET | Freq: Four times a day (QID) | ORAL | 0 refills | Status: AC

## 2021-12-04 MED ORDER — CIPROFLOXACIN HCL 500 MG PO TABS: 500.0000 mg | ORAL_TABLET | Freq: Two times a day (BID) | ORAL | 0 refills | Status: AC

## 2021-12-04 MED ORDER — ONDANSETRON HCL 4 MG/2ML IJ SOLN
4.0000 mg | Freq: Once | INTRAMUSCULAR | Status: AC
  Administered 2021-12-04: 4 mg via INTRAVENOUS
  Filled 2021-12-04: qty 2

## 2021-12-04 NOTE — ED Notes (Signed)
Discharge paperwork given and verbally understood. 

## 2021-12-04 NOTE — ED Notes (Signed)
Provider made aware of being unable to gain IV access.

## 2021-12-04 NOTE — ED Notes (Signed)
Unsuccessful [place] PIV x3, notified care team.

## 2021-12-04 NOTE — ED Triage Notes (Signed)
Pt had hernia repair in April and her colon was nicked at that time (sutured she reports), pain is in the area around her hernia

## 2021-12-04 NOTE — ED Provider Notes (Signed)
Patient is here with abdominal pain.  History of small bowel obstruction.  Awaiting CT scan abdomen and pelvis.  She has had some diarrhea recently.  No recent antibiotics.  Per review and interpretation of labs shows no significant leukocytosis, anemia, electrolyte abnormality.  CT scan shows some nonspecific enteritis. No pain on re-eval. Will rx abx and zofran. Close PCP f/u. Return precautions given.   Lennice Sites, DO 12/04/21 2310

## 2021-12-04 NOTE — ED Triage Notes (Signed)
Since 7am , abdominal pain, vomiting (now down to bile), diarrhea (very loose).

## 2021-12-04 NOTE — ED Provider Notes (Signed)
[facility] EMERGENCY DEPT Provider Note   CSN: [id number] Arrival date & time: 12/04/21  1152     History  Chief Complaint  Patient presents with   Abdominal Pain    Christina [name] is a 71 y.o. female.  HPI     71 year old female comes in with chief complaint of abdominal pain. Patient has history of diabetes, hypertension and previous history of abdominal hernia status post repair and small bowel obstruction.  Patient comes in with chief complaint of abdominal pain that started this morning.  She has associated nausea, vomiting and diarrhea.  Patient has had more than 5 episodes of emesis, it is not bilious.  She also has had about 4-5 episodes of loose, watery stool.  Pain is moderately severe and is generalized.  Home Medications Prior to Admission medications   Medication Sig Start Date End Date Taking? Authorizing Provider  ciprofloxacin (CIPRO) 500 MG tablet Take 1 tablet (500 mg total) by mouth every 12 (twelve) hours for 7 days. 12/04/21 12/11/21 Yes Curatolo, Adam, DO  metroNIDAZOLE (FLAGYL) 500 MG tablet Take 1 tablet (500 mg total) by mouth 2 (two) times daily for 7 days. 12/04/21 12/11/21 Yes Curatolo, Adam, DO  ondansetron (ZOFRAN) 4 MG tablet Take 1 tablet (4 mg total) by mouth every 6 (six) hours. 12/04/21  Yes Curatolo, Adam, DO  ACCU-CHEK AVIVA PLUS test strip  01/18/19   [provider]  Accu-Chek Softclix Lancets lancets  01/17/19   [provider]  acetaminophen (TYLENOL) 500 MG tablet Take 1,000 mg by mouth every 6 (six) hours as needed for mild pain.    [provider]  Alcohol Swabs (B-D SINGLE USE SWABS REGULAR) PADS USE TO TEST BLOOD SUGAR ONCE A DAY    [provider]  amLODipine-benazepril (LOTREL) 10-40 MG capsule Take 1 capsule by mouth daily. 12/26/18   [provider]  APPLE CIDER VINEGAR PO Take 3 capsules by mouth daily. [place] Gummy    [provider]  Blood Glucose Calibration (ACCU-CHEK  GUIDE CONTROL) LIQD See admin instructions. 09/03/20   [provider]  Blood Glucose Monitoring Suppl (ACCU-CHEK GUIDE) w/Device KIT See admin instructions.    [provider]  Calcium Carbonate (CALCIUM 600 PO) Take 600 mg by mouth daily.    [provider]  cholecalciferol (VITAMIN D3) 25 MCG (1000 UNIT) tablet Take 1,000 Units by mouth daily.    [provider]  hydrocortisone (ANUSOL-HC) 2.5 % rectal cream Place rectally 2 (two) times daily. Apply around anus for irritated & painful hemorrhoids 08/24/21   Michael Boston, MD  metFORMIN (GLUCOPHAGE-XR) 500 MG 24 hr tablet Take 1,000 mg by mouth daily with breakfast. 04/12/19   [provider]  neomycin-bacitracin-polymyxin (NEOSPORIN) ointment Apply 1 application. topically as needed for wound care.    [provider]  OVER THE COUNTER MEDICATION Take 2 capsules by mouth daily. Imnuneti otc supplement    [provider]  Vibegron 75 MG TABS Take 75 mg by mouth daily.    [provider]      Allergies    Penicillins, Rosuvastatin, Sulfa antibiotics, Cholestyramine, Gemfibrozil, and Statins    Review of Systems   Review of Systems  All other systems reviewed and are negative.   Physical Exam Updated Vital Signs BP (!) 161/74 (BP Location: Right Arm)   Pulse 64   Temp 98 F (36.7 C)   Resp 16   SpO2 98%  Physical Exam Vitals and nursing note reviewed.  Constitutional:  Appearance: She is well-developed.  HENT:     Head: Atraumatic.  Cardiovascular:     Rate and Rhythm: Normal rate.  Pulmonary:     Effort: Pulmonary effort is normal.  Abdominal:     Tenderness: There is generalized abdominal tenderness. There is no guarding or rebound.  Musculoskeletal:     Cervical back: Normal range of motion and neck supple.  Skin:    General: Skin is warm and dry.  Neurological:     Mental Status: She is alert and oriented to person, place, and time.     ED  Results / Procedures / Treatments   Labs (all labs ordered are listed, but only abnormal results are displayed) Labs Reviewed  COMPREHENSIVE METABOLIC PANEL - Abnormal; Notable for the following components:      Result Value   Glucose, Bld 110 (*)    Creatinine, Ser 1.19 (*)    GFR, Estimated 49 (*)    All other components within normal limits  CBC WITH DIFFERENTIAL/PLATELET  LIPASE, BLOOD    EKG None  Radiology CT ABDOMEN PELVIS WO CONTRAST  Result Date: 12/04/2021 CLINICAL DATA:  Acute abdominal pain, vomiting and diarrhea. History of colon cancer. EXAM: CT ABDOMEN AND PELVIS WITHOUT CONTRAST TECHNIQUE: Multidetector CT imaging of the abdomen and pelvis was performed following the standard protocol without IV contrast. RADIATION DOSE REDUCTION: This exam was performed according to the departmental dose-optimization program which includes automated exposure control, adjustment of the mA and/or kV according to patient size and/or use of iterative reconstruction technique. COMPARISON:  CT abdomen and pelvis 05/28/2021 FINDINGS: Lower chest: No acute abnormality. Hepatobiliary: There are coarse calcifications in the dome of the liver, unchanged. No other liver lesions are identified. Gallbladder and bile ducts are within normal limits. Pancreas: Unremarkable. No pancreatic ductal dilatation or surrounding inflammatory changes. Spleen: Normal in size without focal abnormality. Adrenals/Urinary Tract: There is a 2.3 cm simple cyst in the superior pole the right kidney which is unchanged from the prior examination. Otherwise, the kidneys, adrenal glands and bladder are within normal limits. Stomach/Bowel: Partial right colectomy changes are present. There is no bowel obstruction or free air. There is some wall thickening of central small bowel loops with associated mesenteric edema. No dilated bowel loops are seen. No pneumatosis. There are scattered colonic diverticula. Small hiatal hernia is present.  The stomach is otherwise within normal limits. Vascular/Lymphatic: Aorta and IVC are normal in size. There are atherosclerotic calcifications of the aorta. 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 No enlarged lymph nodes are identified. Reproductive: Uterus and bilateral adnexa are unremarkable. Other: There is a small amount of ascites in the right upper quadrant which has significantly decreased. No focal abdominal wall hernia. There is some scarring near the umbilicus. Musculoskeletal: Multilevel degenerative changes affect the spine. IMPRESSION: 1. Central small bowel loops demonstrate wall thickening and associated mesenteric edema worrisome for nonspecific enteritis. Findings may be infectious, inflammatory or ischemic. There is no pneumatosis or bowel obstruction. 2. Trace ascites in the right upper quadrant has decreased. 3. Colonic diverticulosis. 4.  Aortic Atherosclerosis (ICD10-I70.0). 5. 2.3 cm right Bosniak 2 benign cyst. No follow-up imaging is recommended. JACR 2018 Feb; 264-273, Management of the Incidental RenalMass on CT, [employer] 2021; 814-848, Bosniak Classification of Cystic Renal Masses, Version 2019. Electronically Signed   By: Ronney Asters M.D.   On: 12/04/2021 16:52    Procedures Angiocath insertion  Date/Time: 12/04/2021 3:00 PM  Performed by: Varney Biles, MD  Authorized by: Varney Biles, MD  Consent: Verbal consent obtained. Risks and benefits: risks, benefits and alternatives were discussed Consent given by: patient Patient understanding: patient states understanding of the procedure being performed Patient identity confirmed: arm band Preparation: Patient was prepped and draped in the usual sterile fashion. Local anesthesia used: no  Anesthesia: Local anesthesia used: no  Sedation: Patient sedated: no        Medications Ordered in ED Medications  ondansetron (ZOFRAN) injection 4 mg (4 mg Intravenous Given 12/04/21  1551)  sodium chloride 0.9 % bolus 1,000 mL (0 mLs Intravenous Stopped 12/04/21 1609)    ED Course/ Medical Decision Making/ A&P                           Medical Decision Making Amount and/or Complexity of Data Reviewed Labs: ordered. Radiology: ordered.  Risk Prescription drug management.  This patient presents to the ED with chief complaint(s) of abdominal pain with pertinent past medical history of previous abdominal surgery and small obstruction which further complicates the presenting complaint. The complaint involves an extensive differential diagnosis and also carries with it a high risk of complications and morbidity.    The differential diagnosis includes : Small bowel obstruction, ileus, colitis, intra-abdominal infection  The initial plan is to get basic blood work-up at this time.  We will get CT abdomen and pelvis. Disposition will be based on CT scan.  Additional history obtained: Additional history obtained from family Records reviewed Primary Care Documents  Independent labs interpretation:  The following labs were independently interpreted: Normal CBC and reassuring metabolic profile   Final Clinical Impression(s) / ED Diagnoses Final diagnoses:  Colitis    Rx / DC Orders ED Discharge Orders          Ordered    metroNIDAZOLE (FLAGYL) 500 MG tablet  2 times daily        12/04/21 1744    ciprofloxacin (CIPRO) 500 MG tablet  Every 12 hours        12/04/21 1744    ondansetron (ZOFRAN) 4 MG tablet  Every 6 hours        12/04/21 Collier, Shaakira Borrero, MD 12/05/21 [phone]

## 2021-12-05 DIAGNOSIS — E1169 Type 2 diabetes mellitus with other specified complication: Secondary | ICD-10-CM | POA: Diagnosis not present

## 2021-12-05 DIAGNOSIS — I10 Essential (primary) hypertension: Secondary | ICD-10-CM | POA: Diagnosis not present

## 2021-12-05 DIAGNOSIS — E7800 Pure hypercholesterolemia, unspecified: Secondary | ICD-10-CM | POA: Diagnosis not present

## 2021-12-08 DIAGNOSIS — Z6841 Body Mass Index (BMI) 40.0 and over, adult: Secondary | ICD-10-CM | POA: Diagnosis not present

## 2021-12-08 DIAGNOSIS — K529 Noninfective gastroenteritis and colitis, unspecified: Secondary | ICD-10-CM | POA: Diagnosis not present

## 2021-12-08 DIAGNOSIS — Z09 Encounter for follow-up examination after completed treatment for conditions other than malignant neoplasm: Secondary | ICD-10-CM | POA: Diagnosis not present

## 2022-01-06 ENCOUNTER — Ambulatory Visit (INDEPENDENT_AMBULATORY_CARE_PROVIDER_SITE_OTHER): Payer: Medicare HMO

## 2022-01-06 ENCOUNTER — Ambulatory Visit: Payer: Medicare HMO | Admitting: Podiatry

## 2022-01-06 DIAGNOSIS — M7671 Peroneal tendinitis, right leg: Secondary | ICD-10-CM

## 2022-01-06 DIAGNOSIS — M2012 Hallux valgus (acquired), left foot: Secondary | ICD-10-CM | POA: Diagnosis not present

## 2022-01-06 DIAGNOSIS — M21612 Bunion of left foot: Secondary | ICD-10-CM

## 2022-01-06 DIAGNOSIS — L84 Corns and callosities: Secondary | ICD-10-CM

## 2022-01-06 DIAGNOSIS — M21611 Bunion of right foot: Secondary | ICD-10-CM

## 2022-01-06 DIAGNOSIS — M2041 Other hammer toe(s) (acquired), right foot: Secondary | ICD-10-CM

## 2022-01-06 DIAGNOSIS — M2011 Hallux valgus (acquired), right foot: Secondary | ICD-10-CM

## 2022-01-06 NOTE — Patient Instructions (Signed)
Silcone toe spacer alignment pads can be bought on [place] for Voltaren gel at the pharmacy over the counter or online (also known as diclofenac 1% gel). Apply to the painful areas 3-4x daily with the supplied dosing card. Allow to dry for 10 minutes before going into socks/shoes   Peroneal Tendinopathy Rehab Ask your health care provider which exercises are safe for you. Do exercises exactly as told by your health care provider and adjust them as directed. It is normal to feel mild stretching, pulling, tightness, or discomfort as you do these exercises. Stop right away if you feel sudden pain or your pain gets worse. Do not begin these exercises until told by your health care provider. Stretching and range-of-motion exercises These exercises warm up your muscles and joints and improve the movement and flexibility of your ankle. These exercises also help to relieve pain and stiffness. Gastroc and soleus stretch, standing  This is an exercise in which you stand on a step and use your body weight to stretch your calf muscles. To do this exercise: Stand on the edge of a step on the ball of your left / right foot. The ball of your foot is on the walking surface, right under your toes. Keep your other foot firmly on the same step. Hold on to the wall, a railing, or a chair for balance. Slowly lift your other foot, allowing your body weight to press your left / right heel down over the edge of the step. You should feel a stretch in your left / right calf (gastrocnemius and soleus). Hold this position for 15 seconds. Return both feet to the step. Repeat this exercise with a slight bend in your left / right knee. Repeat 5 times with your left / right knee straight and 5 times with your left / right knee bent. Complete this exercise 2 times a day. Strengthening exercises These exercises build strength and endurance in your foot and ankle. Endurance is the ability to use your muscles for a long time,  even after they get tired. Ankle dorsiflexion with band   Secure a rubber exercise band or tube to an object, such as a table leg, that will not move when the band is pulled. Secure the other end of the band around your left / right foot. Sit on the floor, facing the object with your left / right leg extended. The band or tube should be slightly tense when your foot is relaxed. Slowly flex your left / right ankle and toes to bring your foot toward you (dorsiflexion). Hold this position for 15 seconds. Let the band or tube slowly pull your foot back to the starting position. Repeat 5 times. Complete this exercise 2 times a day. Ankle eversion Sit on the floor with your legs straight out in front of you. Loop a rubber exercise band or tube around the ball of your left / right foot. The ball of your foot is on the walking surface, right under your toes. Hold the ends of the band in your hands, or secure the band to a stable object. The band or tube should be slightly tense when your foot is relaxed. Slowly push your foot outward, away from your other leg (eversion). Hold this position for 15 seconds. Slowly return your foot to the starting position. Repeat 5 times. Complete this exercise 2 times a day. Plantar flexion, standing  This exercise is sometimes called standing heel raise. Stand with your feet shoulder-width apart. Place  your hands on a wall or table to steady yourself as needed, but try not to use it for support. Keep your weight spread evenly over the width of your feet while you slowly rise up on your toes (plantar flexion). If told by your health care provider: Shift your weight toward your left / right leg until you feel challenged. Stand on your left / right leg only. Hold this position for 15 seconds. Repeat 2 times. Complete this exercise 2 times a day. Single leg stand Without shoes, stand near a railing or in a doorway. You may hold on to the railing or door frame as  needed. Stand on your left / right foot. Keep your big toe down on the floor and try to keep your arch lifted. Do not roll to the outside of your foot. If this exercise is too easy, you can try it with your eyes closed or while standing on a pillow. Hold this position for 15 seconds. Repeat 5 times. Complete this exercise 2 times a day. This information is not intended to replace advice given to you by your health care provider. Make sure you discuss any questions you have with your health care provider. Document Revised: 08/09/2018 Document Reviewed: 08/09/2018 Elsevier Patient Education  [place].

## 2022-01-08 NOTE — Progress Notes (Signed)
  Subjective:  Patient ID: Christina [name], female    DOB: 01-18-1951,  MRN: [id number]  Chief Complaint  Patient presents with   Hammer Toe    contracted toe right 5th digit and bilateral bunions.    71 y.o. female presents with the above complaint. History confirmed with patient.  Neither particular painful, she does have pain on the outside of the ankle  Objective:  Physical Exam: warm, good capillary refill, no trophic changes or ulcerative lesions, normal DP and PT pulses, and normal sensory exam. Left Foot: bunion deformity noted Right Foot: bunion deformity noted and lesser hammertoe deformities noted the worst of which is the fifth which is a contracted cock-up toe, she has a small tailor's bunion here as well, neither are tender, she has some pain on the peroneal tendons  No images are attached to the encounter.  Radiographs: Multiple views x-ray of the right foot: Mild forefoot degenerative changes, she has hallux valgus deformity, tailor's bunion deformity Assessment:   1. Hammertoe of right foot   2. Callus of foot   3. Peroneal tendinitis of right lower extremity   4. Hallux valgus with bunions, right   5. Hallux valgus with bunions, left      Plan:  Patient was evaluated and treated and all questions answered.  Discussed etiology and treatment options of hallux valgus deformity as well as hammertoes in her cock-up fifth toe on the right side, we discussed surgical and nonsurgical treatment.  Currently this is fairly minimally painful for her, she would prefer to avoid surgery at this point and continue with nonsurgical treatment.  We discussed offloading with silicone pads and toe regular splints.   Regarding her peroneal tendinitis we discussed the treatment options of this.  I recommended home therapy plan and dispensed this to her today.  Return if symptoms worsen or fail to improve.

## 2022-01-19 DIAGNOSIS — E1136 Type 2 diabetes mellitus with diabetic cataract: Secondary | ICD-10-CM | POA: Diagnosis not present

## 2022-01-19 DIAGNOSIS — H524 Presbyopia: Secondary | ICD-10-CM | POA: Diagnosis not present

## 2022-01-19 DIAGNOSIS — H5203 Hypermetropia, bilateral: Secondary | ICD-10-CM | POA: Diagnosis not present

## 2022-01-19 DIAGNOSIS — H25813 Combined forms of age-related cataract, bilateral: Secondary | ICD-10-CM | POA: Diagnosis not present

## 2022-01-20 DIAGNOSIS — R350 Frequency of micturition: Secondary | ICD-10-CM | POA: Diagnosis not present

## 2022-01-20 DIAGNOSIS — N3946 Mixed incontinence: Secondary | ICD-10-CM | POA: Diagnosis not present

## 2022-01-27 DIAGNOSIS — Z0100 Encounter for examination of eyes and vision without abnormal findings: Secondary | ICD-10-CM | POA: Diagnosis not present

## 2022-01-27 DIAGNOSIS — H524 Presbyopia: Secondary | ICD-10-CM | POA: Diagnosis not present

## 2022-02-03 DIAGNOSIS — Z1231 Encounter for screening mammogram for malignant neoplasm of breast: Secondary | ICD-10-CM | POA: Diagnosis not present

## 2022-02-03 DIAGNOSIS — M85851 Other specified disorders of bone density and structure, right thigh: Secondary | ICD-10-CM | POA: Diagnosis not present

## 2022-02-25 ENCOUNTER — Ambulatory Visit: Payer: Medicare HMO | Admitting: Podiatry

## 2022-03-03 ENCOUNTER — Ambulatory Visit: Payer: Medicare HMO | Admitting: Podiatry

## 2022-03-03 ENCOUNTER — Encounter: Payer: Self-pay | Admitting: Podiatry

## 2022-03-03 DIAGNOSIS — M79675 Pain in left toe(s): Secondary | ICD-10-CM | POA: Diagnosis not present

## 2022-03-03 DIAGNOSIS — L84 Corns and callosities: Secondary | ICD-10-CM

## 2022-03-03 DIAGNOSIS — E119 Type 2 diabetes mellitus without complications: Secondary | ICD-10-CM | POA: Diagnosis not present

## 2022-03-03 DIAGNOSIS — M79674 Pain in right toe(s): Secondary | ICD-10-CM

## 2022-03-03 DIAGNOSIS — B351 Tinea unguium: Secondary | ICD-10-CM | POA: Diagnosis not present

## 2022-03-08 NOTE — Progress Notes (Signed)
  Subjective:  Patient ID: Christina [name], female    DOB: [birth year],  MRN: [id number]  Christina [name] presents to clinic today for callus(es) b/l lower extremities and painful thick toenails that are difficult to trim. Painful toenails interfere with ambulation. Aggravating factors include wearing enclosed shoe gear. Pain is relieved with periodic professional debridement. Painful calluses are aggravated when weightbearing with and without shoegear. Pain is relieved with periodic professional debridement.   She did see Dr. Sherryle Lis for her hammertoe and bunion deformities as well as peroneal tendonitis. Decision was made to continue conservative treatment for now. Chief Complaint  Patient presents with   Nail Problem    DFC -  BG - pt states she does not check sugar A1C - 6.5  PCP - Dr Harrington Challenger    New problem(s): None.   PCP is [name], MD , and last visit was December 08, 2021.  Allergies  Allergen Reactions   Penicillins Dermatitis and Rash    Tolerated Cephalosporin Date: 04/01/21. rash/ skin peel/ and nails come off Other reaction(s): rash/ skin peel/ and nails come off   Rosuvastatin     Cramps (ALLERGY/intolerance), muscle pain Other reaction(s): muscle pain   Sulfa Antibiotics Nausea And Vomiting    Muscle Cramps   Cholestyramine Diarrhea   Gemfibrozil Diarrhea and Nausea And Vomiting   Statins     Cramps, muscle pain    Review of Systems: Negative except as noted in the HPI.  Objective: No changes noted in today's physical examination.  Christina [name] is a pleasant 71 y.o. female in NAD. AAO x 3. Vascular Examination: CFT immediate b/l LE. Palpable DP/PT pulses b/l LE. Digital hair sparse b/l. Skin temperature gradient WNL b/l. No pain with calf compression b/l. Trace edema b/l LE. No cyanosis or clubbing noted b/l LE.  Dermatological Examination: Pedal integument with normal turgor, texture and tone b/l LE. No open wounds b/l. No interdigital macerations b/l.  Toenails 1-5 b/l elongated, thickened, discolored with subungual debris. +Tenderness with dorsal palpation of nailplates. Hyperkeratotic lesion(s) noted and submet head 5 b/l and plantar IPJ right great toe.  Musculoskeletal Examination: Normal muscle strength 5/5 to all lower extremity muscle groups bilaterally. Hammertoe(s) noted to the L 5th toe and R 5th toe. HAV with bunion deformity noted b/l LE. Patient ambulates with cane assistance today.  Neurological Examination: Protective sensation intact 5/5 intact bilaterally with 10g monofilament b/l. Vibratory sensation intact b/l.  Assessment/Plan: 1. Pain due to onychomycosis of toenails of both feet   2. Callus of foot   3. Controlled type 2 diabetes mellitus without complication, without long-term current use of insulin ([place])     No orders of the defined types were placed in this encounter.   -Consent given for treatment as described below: -Examined patient. -Continue foot and shoe inspections daily. Monitor blood glucose per PCP/Endocrinologist's recommendations. -Mycotic toenails 1-5 bilaterally were debrided in length and girth with sterile nail nippers and dremel without incident. -Callus(es) submet head 5 b/l pared utilizing sterile scalpel blade without complication or incident. Total number debrided =2. -Patient/POA to call should there be question/concern in the interim.   Return in about 3 months (around 06/03/2022).  Marzetta Board, DPM

## 2022-03-19 DIAGNOSIS — Z23 Encounter for immunization: Secondary | ICD-10-CM | POA: Diagnosis not present

## 2022-05-26 DIAGNOSIS — R3912 Poor urinary stream: Secondary | ICD-10-CM | POA: Diagnosis not present

## 2022-06-16 DIAGNOSIS — D128 Benign neoplasm of rectum: Secondary | ICD-10-CM | POA: Diagnosis not present

## 2022-06-16 DIAGNOSIS — D122 Benign neoplasm of ascending colon: Secondary | ICD-10-CM | POA: Diagnosis not present

## 2022-06-16 DIAGNOSIS — K644 Residual hemorrhoidal skin tags: Secondary | ICD-10-CM | POA: Diagnosis not present

## 2022-06-16 DIAGNOSIS — Z980 Intestinal bypass and anastomosis status: Secondary | ICD-10-CM | POA: Diagnosis not present

## 2022-06-16 DIAGNOSIS — Z86010 Personal history of colonic polyps: Secondary | ICD-10-CM | POA: Diagnosis not present

## 2022-06-16 DIAGNOSIS — Z85038 Personal history of other malignant neoplasm of large intestine: Secondary | ICD-10-CM | POA: Diagnosis not present

## 2022-06-16 DIAGNOSIS — K5730 Diverticulosis of large intestine without perforation or abscess without bleeding: Secondary | ICD-10-CM | POA: Diagnosis not present

## 2022-06-17 DIAGNOSIS — N183 Chronic kidney disease, stage 3 unspecified: Secondary | ICD-10-CM | POA: Diagnosis not present

## 2022-06-17 DIAGNOSIS — Z0000 Encounter for general adult medical examination without abnormal findings: Secondary | ICD-10-CM | POA: Diagnosis not present

## 2022-06-17 DIAGNOSIS — I700 Atherosclerosis of aorta: Secondary | ICD-10-CM | POA: Diagnosis not present

## 2022-06-17 DIAGNOSIS — E7800 Pure hypercholesterolemia, unspecified: Secondary | ICD-10-CM | POA: Diagnosis not present

## 2022-06-17 DIAGNOSIS — M179 Osteoarthritis of knee, unspecified: Secondary | ICD-10-CM | POA: Diagnosis not present

## 2022-06-17 DIAGNOSIS — E1169 Type 2 diabetes mellitus with other specified complication: Secondary | ICD-10-CM | POA: Diagnosis not present

## 2022-06-17 DIAGNOSIS — I10 Essential (primary) hypertension: Secondary | ICD-10-CM | POA: Diagnosis not present

## 2022-06-17 DIAGNOSIS — Z6841 Body Mass Index (BMI) 40.0 and over, adult: Secondary | ICD-10-CM | POA: Diagnosis not present

## 2022-06-17 DIAGNOSIS — B351 Tinea unguium: Secondary | ICD-10-CM | POA: Diagnosis not present

## 2022-06-18 DIAGNOSIS — D122 Benign neoplasm of ascending colon: Secondary | ICD-10-CM | POA: Diagnosis not present

## 2022-06-22 ENCOUNTER — Encounter: Payer: Self-pay | Admitting: Podiatry

## 2022-06-22 ENCOUNTER — Ambulatory Visit (INDEPENDENT_AMBULATORY_CARE_PROVIDER_SITE_OTHER): Payer: Medicare HMO | Admitting: Podiatry

## 2022-06-22 VITALS — BP 157/80

## 2022-06-22 DIAGNOSIS — M79674 Pain in right toe(s): Secondary | ICD-10-CM

## 2022-06-22 DIAGNOSIS — M79675 Pain in left toe(s): Secondary | ICD-10-CM

## 2022-06-22 DIAGNOSIS — E119 Type 2 diabetes mellitus without complications: Secondary | ICD-10-CM | POA: Diagnosis not present

## 2022-06-22 DIAGNOSIS — L84 Corns and callosities: Secondary | ICD-10-CM | POA: Diagnosis not present

## 2022-06-22 DIAGNOSIS — B351 Tinea unguium: Secondary | ICD-10-CM | POA: Diagnosis not present

## 2022-06-22 NOTE — Progress Notes (Signed)
  Subjective:  Patient ID: Christina [name], female    DOB: 21-Aug-1950,  MRN: [id number]  Christina [name] presents to clinic today for preventative diabetic foot care and callus(es) both feet and painful thick toenails that are difficult to trim. Painful toenails interfere with ambulation. Aggravating factors include wearing enclosed shoe gear. Pain is relieved with periodic professional debridement. Painful calluses are aggravated when weightbearing with and without shoegear. Pain is relieved with periodic professional debridement.  Chief Complaint  Patient presents with   Nail Problem    DFC BS-did not check today A1C-7.2 PCP-Ross,Alan PCP VST-06/17/2022   New problem(s): None.   Patient states she got her appointments mixed up today. She hasn't gotten much sleep over night due to traveling back to [place] thinking her appointment with [place] was today. She is also wanting to see a dietician to help with her food choices.  PCP is [name], MD.  Allergies  Allergen Reactions   Penicillins Dermatitis and Rash    Tolerated Cephalosporin Date: 04/01/21. rash/ skin peel/ and nails come off Other reaction(s): rash/ skin peel/ and nails come off   Rosuvastatin     Cramps (ALLERGY/intolerance), muscle pain Other reaction(s): muscle pain   Sulfa Antibiotics Nausea And Vomiting    Muscle Cramps   Cholestyramine Diarrhea   Gemfibrozil Diarrhea and Nausea And Vomiting   Statins     Cramps, muscle pain    Review of Systems: Negative except as noted in the HPI.  Objective:  Vitals:   06/22/22 0846  BP: (!) 157/80   Christina [name] is a pleasant 72 y.o. female obese in NAD. AAO x 3.  Vascular Examination: CFT immediate b/l LE. Palpable DP/PT pulses b/l LE. Digital hair sparse b/l. Skin temperature gradient WNL b/l. No pain with calf compression b/l. Trace edema b/l LE. No cyanosis or clubbing noted b/l LE.  Dermatological Examination: Pedal integument with normal turgor, texture and tone  b/l LE. No open wounds b/l. No interdigital macerations b/l. Toenails 1-5 b/l elongated, thickened, discolored with subungual debris. +Tenderness with dorsal palpation of nailplates.   Hyperkeratotic lesion(s) noted and submet head 5 b/l and plantar IPJ bilateral great toes.  Musculoskeletal Examination: Normal muscle strength 5/5 to all lower extremity muscle groups bilaterally. Hammertoe(s) noted to the L 5th toe and R 5th toe. HAV with bunion deformity noted b/l LE. Patient ambulates with cane assistance today.  Neurological Examination: Protective sensation intact 5/5 intact bilaterally with 10g monofilament b/l. Vibratory sensation intact b/l.  Assessment/Plan: 1. Pain due to onychomycosis of toenails of both feet   2. Callus of foot   3. Controlled type 2 diabetes mellitus without complication, without long-term current use of insulin ([place])     -Patient was evaluated and treated. All patient's and/or POA's questions/concerns answered on today's visit. -We discussed benefits of seeing a dietician and she will discuss with her PCP. -Patient to continue soft, supportive shoe gear daily. -Toenails 1-5 b/l were debrided in length and girth with sterile nail nippers and dremel without iatrogenic bleeding.  -Callus(es) plantar IPJ of left great toe, plantar IPJ of right great toe, right great toe, and submet head 5 b/l pared utilizing sterile scalpel blade without complication or incident. Total number debrided =4. -Patient/POA to call should there be question/concern in the interim.   Return in about 9 weeks (around 08/24/2022).  Marzetta Board, DPM

## 2022-06-24 ENCOUNTER — Ambulatory Visit: Payer: Medicare HMO | Admitting: Podiatry

## 2022-07-09 DIAGNOSIS — H2512 Age-related nuclear cataract, left eye: Secondary | ICD-10-CM | POA: Diagnosis not present

## 2022-08-01 DIAGNOSIS — N751 Abscess of Bartholin's gland: Secondary | ICD-10-CM | POA: Diagnosis not present

## 2022-08-12 DIAGNOSIS — N907 Vulvar cyst: Secondary | ICD-10-CM | POA: Diagnosis not present

## 2022-08-14 DIAGNOSIS — H2512 Age-related nuclear cataract, left eye: Secondary | ICD-10-CM | POA: Diagnosis not present

## 2022-08-14 DIAGNOSIS — H269 Unspecified cataract: Secondary | ICD-10-CM | POA: Diagnosis not present

## 2022-08-19 DIAGNOSIS — N3946 Mixed incontinence: Secondary | ICD-10-CM | POA: Diagnosis not present

## 2022-08-19 DIAGNOSIS — R350 Frequency of micturition: Secondary | ICD-10-CM | POA: Diagnosis not present

## 2022-08-28 DIAGNOSIS — H2511 Age-related nuclear cataract, right eye: Secondary | ICD-10-CM | POA: Diagnosis not present

## 2022-08-28 DIAGNOSIS — H25811 Combined forms of age-related cataract, right eye: Secondary | ICD-10-CM | POA: Diagnosis not present

## 2022-08-28 DIAGNOSIS — H269 Unspecified cataract: Secondary | ICD-10-CM | POA: Diagnosis not present

## 2022-09-01 ENCOUNTER — Encounter: Payer: Self-pay | Admitting: Podiatry

## 2022-09-01 ENCOUNTER — Ambulatory Visit: Payer: Medicare HMO | Admitting: Podiatry

## 2022-09-01 VITALS — BP 135/75

## 2022-09-01 DIAGNOSIS — L84 Corns and callosities: Secondary | ICD-10-CM

## 2022-09-01 DIAGNOSIS — M2012 Hallux valgus (acquired), left foot: Secondary | ICD-10-CM | POA: Diagnosis not present

## 2022-09-01 DIAGNOSIS — M79675 Pain in left toe(s): Secondary | ICD-10-CM

## 2022-09-01 DIAGNOSIS — E119 Type 2 diabetes mellitus without complications: Secondary | ICD-10-CM

## 2022-09-01 DIAGNOSIS — B351 Tinea unguium: Secondary | ICD-10-CM | POA: Diagnosis not present

## 2022-09-01 DIAGNOSIS — M79674 Pain in right toe(s): Secondary | ICD-10-CM | POA: Diagnosis not present

## 2022-09-01 DIAGNOSIS — M21612 Bunion of left foot: Secondary | ICD-10-CM | POA: Diagnosis not present

## 2022-09-02 DIAGNOSIS — N907 Vulvar cyst: Secondary | ICD-10-CM | POA: Diagnosis not present

## 2022-09-04 NOTE — Progress Notes (Signed)
ANNUAL DIABETIC FOOT EXAM  Subjective: Christina [name] presents today annual diabetic foot exam.  Patient confirms h/o diabetes.  Patient denies any h/o foot wounds.  Patient denies any numbness, tingling, burning, or pins/needle sensation in feet.  Risk factors: diabetes, HTN, CKD, hypercholesterolemia.  Daisy Floro, MD is patient's PCP.  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   SBO (small bowel obstruction) (HCC) 05/29/2021   Acute gastroenteritis 05/29/2021   Type 2 diabetes mellitus with stage 3 chronic kidney disease (HCC) 05/29/2021   Hypokalemia 05/29/2021   Pain in lower limb 05/20/2021   Primary osteoarthritis of left knee 03/31/2021   Encounter for orthopedic follow-up care 01/02/2021   Mass of skin of left forearm 12/16/2020   Pain in joint of right hip 06/10/2020   Pain in joint of left knee 06/10/2020   Stage 3a chronic kidney disease (CKD) (HCC) 05/08/2020   Chronic pain 05/08/2020   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   Hardening of the aorta (main artery of the heart) (HCC) 05/08/2020   Hypercholesterolemia 05/08/2020   Morbid obesity (HCC) 05/08/2020   Myalgia 05/08/2020   Osteopenia 05/08/2020   Sciatica 05/08/2020   Urgency of urination 05/08/2020   Urinary incontinence 05/08/2020   Statin not tolerated 05/08/2020   Adhesive capsulitis of left shoulder 08/08/2019   Mass of joint of left hand 02/15/2019   Mass of joint of left wrist 02/15/2019   Abdominal pain, vomiting, and diarrhea 01/12/2019   Renal insufficiency 01/12/2019   Adrenal nodule (HCC)  01/12/2019   Pain in thumb joint with movement, right 12/28/2018   Pain of left hand 12/28/2018   Degenerative scoliosis 05/23/2018   Degenerative spondylolisthesis 05/23/2018   Constipation 09/07/2013   Surgical wound infection 08/08/2013   Diabetes mellitus without complication (HCC)    Hypertension    OA (osteoarthritis) of knee 08/02/2013   Total knee replacement status 08/02/2013   History of colon cancer 06/19/2010   Personal history of other malignant neoplasm of large intestine 06/19/2010   Past Surgical History:  Procedure Laterality Date   BREAST SURGERY     RIGHT AND LEFT BREAST TUMORS REMOVED - BENIGN age 34 and 10   C-SECTIONS X 2  1981   1991   COLON SURGERY  2015   CYST EXCISION Left 2020   hand and ganglian cyst and 2021 Lt arm   ECTOPIC PREGNANCY SURGERY  1986   FATTY TUMOR REMOVED FROM RIGHT HEEL     age 28   LAPAROSCOPY N/A 08/19/2021   Procedure: LAPAROSCOPIC LYSIS OF ADHESIONS AND REPAIR OF ENTEROTOMY;  Surgeon: Quentin Ore, MD;  Location: [facility];  Service: General;  Laterality: N/A;   TONSILLECTOMY     AT AGE OF 12   TOTAL KNEE ARTHROPLASTY Right 08/02/2013   Procedure: RIGHT TOTAL KNEE ARTHROPLASTY;  Surgeon: Jacki Cones, MD;  Location: [facility];  Service: Orthopedics;  Laterality: Right;   TOTAL KNEE ARTHROPLASTY Left 03/31/2021   Procedure: TOTAL KNEE ARTHROPLASTY;  Surgeon: Ollen Gross, MD;  Location: [facility];  Service: Orthopedics;  Laterality: Left;   UMBILICAL HERNIA REPAIR N/A 08/19/2021   Procedure: PRIMARY REPAIR OF PERIUMBILICAL INCISIONAL HERNIA;  Surgeon: Quentin Ore, MD;  Location: [facility];  Service: General;  Laterality: N/A;   Current Outpatient Medications on File Prior to Visit  Medication Sig Dispense Refill   ACCU-CHEK AVIVA PLUS test strip      Accu-Chek Softclix Lancets lancets      acetaminophen (TYLENOL) 500 MG tablet Take 1,000 mg by mouth every 6 (six) hours as needed for mild pain.     Alcohol Swabs (B-D  SINGLE USE SWABS REGULAR) PADS USE TO TEST BLOOD SUGAR ONCE A DAY     amLODipine-benazepril (LOTREL) 10-40 MG capsule Take 1 capsule by mouth daily.     APPLE CIDER VINEGAR PO Take 3 capsules by mouth daily. [place] Gummy     Blood Glucose Calibration (ACCU-CHEK GUIDE CONTROL) LIQD See admin instructions.     Blood Glucose Monitoring Suppl (ACCU-CHEK GUIDE) w/Device KIT See admin instructions.     Calcium Carbonate (CALCIUM 600 PO) Take 600 mg by mouth daily.     cholecalciferol (VITAMIN D3) 25 MCG (1000 UNIT) tablet Take 1,000 Units by mouth daily.     hydrocortisone (ANUSOL-HC) 2.5 % rectal cream Place rectally 2 (two) times daily. Apply around anus for irritated & painful hemorrhoids 15 g 2   metFORMIN (GLUCOPHAGE-XR) 500 MG 24 hr tablet Take 1,000 mg by mouth daily with breakfast.     neomycin-bacitracin-polymyxin (NEOSPORIN) ointment Apply 1 application. topically as needed for wound care.     ondansetron (ZOFRAN) 4 MG tablet Take 1 tablet (4 mg total) by mouth every 6 (six) hours. 12 tablet 0   OVER THE COUNTER MEDICATION Take 2 capsules by mouth daily. Imnuneti otc supplement     Vibegron 75 MG TABS Take 75 mg by mouth daily.     No current facility-administered medications on file prior to visit.    Allergies  Allergen Reactions   Penicillins Dermatitis and Rash    Tolerated Cephalosporin Date: 04/01/21. rash/ skin peel/ and nails come off Other reaction(s): rash/ skin peel/ and nails come off   Rosuvastatin     Cramps (ALLERGY/intolerance), muscle pain Other reaction(s): muscle pain   Sulfa Antibiotics Nausea And Vomiting    Muscle Cramps   Cholestyramine Diarrhea   Gemfibrozil Diarrhea and Nausea And Vomiting   Statins     Cramps, muscle pain   Social History   Occupational History   Not on file  Tobacco Use   Smoking status: Never   Smokeless tobacco: Never  Vaping Use   Vaping Use: Never used  Substance and Sexual Activity   Alcohol use: No   Drug use: No   Sexual  activity: Not on file   Family History  Problem Relation Age of Onset   Hypertension Mother    Heart attack Mother    Immunization History  Administered Date(s) Administered   Influenza Split 03/19/2009   Influenza,inj,Quad PF,6+ Mos 02/24/2016, 03/15/2017   Moderna Sars-Covid-2 Vaccination 06/16/2019, 07/14/2019, 03/06/2020   Pneumococcal Conjugate-13 05/23/2015   Pneumococcal Polysaccharide-23 03/19/2009   Td 01/01/2017   Tdap 04/04/2007   Zoster, Live 10/06/2012     Review of Systems: Negative except as noted in the HPI.   Objective: Vitals:   09/01/22 1458  BP: 135/75    Corrin Jan is a pleasant 72 y.o. female in NAD. AAO X 3.  Vascular Examination: Capillary refill time to digits immediate b/l. Palpable pedal pulses b/l LE.  Pedal hair sparse. No pain with calf compression b/l. Lower extremity skin temperature gradient within normal limits. Dependent edema noted b/l LE. No ischemia or gangrene noted b/l LE. No cyanosis or clubbing noted b/l LE.  Dermatological Examination: Pedal skin is warm and supple b/l LE. No open wounds b/l LE. No interdigital macerations noted b/l LE. Toenails 1-5 b/l elongated, discolored, dystrophic, thickened, crumbly with subungual debris and tenderness to dorsal palpation. Hyperkeratotic lesion(s) submet head 5 b/l.  No erythema, no edema, no drainage, no fluctuance.  Neurological Examination: Protective sensation intact 5/5 intact bilaterally with 10g monofilament b/l. Vibratory sensation intact b/l. Proprioception intact bilaterally.  Musculoskeletal Examination: Muscle strength 5/5 to all lower extremity muscle groups bilaterally. HAV with bunion deformity noted b/l LE. Hammertoe(s) noted to the bilateral 5th toes.  Lab Results  Component Value Date   HGBA1C 6.0 (H) 07/22/2021   No results found. ADA Risk Categorization: Low Risk :  Patient has all of the following: Intact protective sensation No prior foot ulcer  No severe  deformity Pedal pulses present  High Risk  Patient has one or more of the following: Loss of protective sensation Absent pedal pulses Severe Foot deformity History of foot ulcer  Assessment: 1. Pain due to onychomycosis of toenails of both feet   2. Callus of foot   3. Hallux valgus with bunions, left   4. Controlled type 2 diabetes mellitus without complication, without long-term current use of insulin (HCC)   5. Encounter for diabetic foot exam (HCC)     Plan: -Consent given for treatment as described below: -Examined patient. -Diabetic foot examination performed today. -Patient to continue soft, supportive shoe gear daily. -Toenails 1-5 b/l were debrided in length and girth with sterile nail nippers and dremel without iatrogenic bleeding.  -Mycotic toenails 1-5 bilaterally were debrided in length and girth with sterile nail nippers and dremel without incident. -Callus(es) submet head 5 b/l pared utilizing sharp debridement with sterile blade without complication or incident. Total number debrided =2. -Patient/POA to call should there be question/concern in the interim. Return in about 3 months (around 12/01/2022).  Freddie Breech, DPM

## 2022-09-17 DIAGNOSIS — N907 Vulvar cyst: Secondary | ICD-10-CM | POA: Diagnosis not present

## 2022-09-29 ENCOUNTER — Other Ambulatory Visit: Payer: Self-pay | Admitting: Family Medicine

## 2022-09-29 ENCOUNTER — Encounter: Payer: Self-pay | Admitting: Family Medicine

## 2022-09-29 DIAGNOSIS — K862 Cyst of pancreas: Secondary | ICD-10-CM

## 2022-10-15 ENCOUNTER — Ambulatory Visit
Admission: RE | Admit: 2022-10-15 | Discharge: 2022-10-15 | Disposition: A | Discharge status: Home / Self Care | Payer: Medicare HMO | Source: Ambulatory Visit | Attending: Family Medicine | Admitting: Family Medicine

## 2022-10-15 DIAGNOSIS — K862 Cyst of pancreas: Secondary | ICD-10-CM

## 2022-10-15 MED ORDER — GADOPICLENOL 0.5 MMOL/ML IV SOLN
10.0000 mL | Freq: Once | INTRAVENOUS | Status: AC | PRN
  Administered 2022-10-15: 10 mL via INTRAVENOUS

## 2022-11-10 ENCOUNTER — Ambulatory Visit (INDEPENDENT_AMBULATORY_CARE_PROVIDER_SITE_OTHER): Payer: Medicare HMO | Admitting: Podiatry

## 2022-11-10 DIAGNOSIS — Z91199 Patient's noncompliance with other medical treatment and regimen due to unspecified reason: Secondary | ICD-10-CM

## 2022-11-11 ENCOUNTER — Encounter: Payer: Self-pay | Admitting: Podiatry

## 2022-11-11 ENCOUNTER — Ambulatory Visit (INDEPENDENT_AMBULATORY_CARE_PROVIDER_SITE_OTHER): Payer: Medicare HMO | Admitting: Podiatry

## 2022-11-11 DIAGNOSIS — M79675 Pain in left toe(s): Secondary | ICD-10-CM | POA: Diagnosis not present

## 2022-11-11 DIAGNOSIS — L84 Corns and callosities: Secondary | ICD-10-CM

## 2022-11-11 DIAGNOSIS — N1831 Chronic kidney disease, stage 3a: Secondary | ICD-10-CM | POA: Diagnosis not present

## 2022-11-11 DIAGNOSIS — B351 Tinea unguium: Secondary | ICD-10-CM | POA: Diagnosis not present

## 2022-11-11 DIAGNOSIS — E1122 Type 2 diabetes mellitus with diabetic chronic kidney disease: Secondary | ICD-10-CM

## 2022-11-11 DIAGNOSIS — M79674 Pain in right toe(s): Secondary | ICD-10-CM

## 2022-11-11 NOTE — Progress Notes (Signed)
This patient returns to my office for at risk foot care.  This patient requires this care by a professional since this patient will be at risk due to having This patient is unable to cut nails himself since the patient cannot reach his nails.These nails are painful walking and wearing shoes.  This patient presents for at risk foot care today.  General Appearance  Alert, conversant and in no acute stress.  Vascular  Dorsalis pedis and posterior tibial  pulses are palpable  bilaterally.  Capillary return is within normal limits  bilaterally. Temperature is within normal limits  bilaterally.  Neurologic  Senn-Weinstein monofilament wire test within normal limits  bilaterally. Muscle power within normal limits bilaterally.  Nails Thick disfigured discolored nails with subungual debris  from hallux to fifth toes bilaterally. No evidence of bacterial infection or drainage bilaterally.  Orthopedic  No limitations of motion  feet .  No crepitus or effusions noted.  HAV  B/L.  Hammer toes  B/L.  Skin  normotropic skin with no porokeratosis noted bilaterally.  No signs of infections or ulcers noted.     Onychomycosis  Pain in right toes  Pain in left toes  Consent was obtained for treatment procedures.   Mechanical debridement of nails 1-5  bilaterally performed with a nail nipper.  Filed with dremel without incident.    Return office visit    3 months with Dr.  Donzetta Matters.                 Told patient to return for periodic foot care and evaluation due to potential at risk complications.   Helane Gunther DPM

## 2022-11-13 NOTE — Progress Notes (Signed)
1. No-show for appointment     

## 2022-12-29 DIAGNOSIS — E1169 Type 2 diabetes mellitus with other specified complication: Secondary | ICD-10-CM | POA: Diagnosis not present

## 2022-12-29 DIAGNOSIS — I10 Essential (primary) hypertension: Secondary | ICD-10-CM | POA: Diagnosis not present

## 2022-12-29 DIAGNOSIS — N183 Chronic kidney disease, stage 3 unspecified: Secondary | ICD-10-CM | POA: Diagnosis not present

## 2022-12-29 DIAGNOSIS — Z658 Other specified problems related to psychosocial circumstances: Secondary | ICD-10-CM | POA: Diagnosis not present

## 2023-01-07 DIAGNOSIS — H04123 Dry eye syndrome of bilateral lacrimal glands: Secondary | ICD-10-CM | POA: Diagnosis not present

## 2023-01-19 ENCOUNTER — Ambulatory Visit (INDEPENDENT_AMBULATORY_CARE_PROVIDER_SITE_OTHER): Payer: Medicare HMO | Admitting: Podiatry

## 2023-01-19 ENCOUNTER — Encounter: Payer: Self-pay | Admitting: Podiatry

## 2023-01-19 DIAGNOSIS — M79674 Pain in right toe(s): Secondary | ICD-10-CM | POA: Diagnosis not present

## 2023-01-19 DIAGNOSIS — M79675 Pain in left toe(s): Secondary | ICD-10-CM

## 2023-01-19 DIAGNOSIS — B351 Tinea unguium: Secondary | ICD-10-CM | POA: Diagnosis not present

## 2023-01-19 DIAGNOSIS — N1831 Chronic kidney disease, stage 3a: Secondary | ICD-10-CM | POA: Diagnosis not present

## 2023-01-19 DIAGNOSIS — E1122 Type 2 diabetes mellitus with diabetic chronic kidney disease: Secondary | ICD-10-CM | POA: Diagnosis not present

## 2023-01-19 DIAGNOSIS — L84 Corns and callosities: Secondary | ICD-10-CM | POA: Diagnosis not present

## 2023-01-24 NOTE — Progress Notes (Signed)
Subjective:  Patient ID: Christina [name], female    DOB: 01-May-1951,  MRN: [id number]  Christina [name] presents to clinic today for: at risk foot care. Pt has h/o NIDDM with chronic kidney disease and callus(es) of both feet and painful thick toenails that are difficult to trim. Painful toenails interfere with ambulation. Aggravating factors include wearing enclosed shoe gear. Pain is relieved with periodic professional debridement. Painful calluses are aggravated when weightbearing with and without shoegear. Pain is relieved with periodic professional debridement.  Chief Complaint  Patient presents with   Diabetes    DFC BS - DIDN'T CHECK IT A1C - 6.5    PCP is Daisy Floro, MD.  Allergies  Allergen Reactions   Penicillins Dermatitis and Rash    Tolerated Cephalosporin Date: 04/01/21. rash/ skin peel/ and nails come off Other reaction(s): rash/ skin peel/ and nails come off   Rosuvastatin     Cramps (ALLERGY/intolerance), muscle pain Other reaction(s): muscle pain   Sulfa Antibiotics Nausea And Vomiting    Muscle Cramps   Cholestyramine Diarrhea   Gemfibrozil Diarrhea and Nausea And Vomiting   Statins     Cramps, muscle pain    Review of Systems: Negative except as noted in the HPI.  Objective: No changes noted in today's physical examination. There were no vitals filed for this visit.  Christina [name] is a pleasant 72 y.o. female in NAD. AAO x 3.  Vascular Examination: Capillary refill time <3 seconds b/l LE. Palpable pedal pulses b/l LE. Digital hair sparse b/l. Mild pedal edema b/l. Skin temperature gradient WNL b/l. No varicosities b/l. Marland Kitchen  Dermatological Examination: Pedal skin with normal turgor, texture and tone b/l. No open wounds. No interdigital macerations b/l. Toenails 1-5 b/l thickened, discolored, dystrophic with subungual debris. There is pain on palpation to dorsal aspect of nailplates. Hyperkeratotic lesion(s) submet head 5 b/l.  No erythema, no edema, no  drainage, no fluctuance..  Neurological Examination: Protective sensation intact with 10 gram monofilament b/l LE. Vibratory sensation intact b/l LE.   Musculoskeletal Examination: Normal muscle strength 5/5 to all lower extremity muscle groups bilaterally. HAV with bunion deformity noted b/l LE. Hammertoe(s) noted to the left fifth digit and right fifth digit.Marland Kitchen No pain, crepitus or joint limitation noted with ROM b/l LE.  Patient ambulates independently without assistive aids.  Assessment/Plan: 1. Pain due to onychomycosis of toenails of both feet   2. Callus of foot   3. Type 2 diabetes mellitus with stage 3a chronic kidney disease, without long-term current use of insulin (HCC)    -Patient was evaluated and treated. All patient's and/or POA's questions/concerns answered on today's visit. -Continue foot and shoe inspections daily. Monitor blood glucose per PCP/Endocrinologist's recommendations. -Continue supportive shoe gear daily. -Mycotic toenails 1-5 bilaterally were debrided in length and girth with sterile nail nippers and dremel without incident. -Callus(es) submet head 5 b/l pared utilizing sterile scalpel blade without complication or incident. Total number debrided =2. -Patient/POA to call should there be question/concern in the interim.   Return in about 3 months (around 04/20/2023).  Freddie Breech, DPM

## 2023-02-02 DIAGNOSIS — Z7984 Long term (current) use of oral hypoglycemic drugs: Secondary | ICD-10-CM | POA: Diagnosis not present

## 2023-02-02 DIAGNOSIS — R112 Nausea with vomiting, unspecified: Secondary | ICD-10-CM | POA: Diagnosis not present

## 2023-02-02 DIAGNOSIS — R42 Dizziness and giddiness: Secondary | ICD-10-CM | POA: Diagnosis not present

## 2023-02-02 DIAGNOSIS — N189 Chronic kidney disease, unspecified: Secondary | ICD-10-CM | POA: Insufficient documentation

## 2023-02-02 DIAGNOSIS — I10 Essential (primary) hypertension: Secondary | ICD-10-CM | POA: Diagnosis not present

## 2023-02-02 DIAGNOSIS — E119 Type 2 diabetes mellitus without complications: Secondary | ICD-10-CM | POA: Insufficient documentation

## 2023-02-02 DIAGNOSIS — I129 Hypertensive chronic kidney disease with stage 1 through stage 4 chronic kidney disease, or unspecified chronic kidney disease: Secondary | ICD-10-CM | POA: Insufficient documentation

## 2023-02-02 DIAGNOSIS — E1169 Type 2 diabetes mellitus with other specified complication: Secondary | ICD-10-CM | POA: Diagnosis not present

## 2023-02-02 DIAGNOSIS — R110 Nausea: Secondary | ICD-10-CM | POA: Diagnosis not present

## 2023-02-02 DIAGNOSIS — R197 Diarrhea, unspecified: Secondary | ICD-10-CM | POA: Insufficient documentation

## 2023-02-02 DIAGNOSIS — Z6838 Body mass index (BMI) 38.0-38.9, adult: Secondary | ICD-10-CM | POA: Diagnosis not present

## 2023-02-02 DIAGNOSIS — N289 Disorder of kidney and ureter, unspecified: Secondary | ICD-10-CM | POA: Diagnosis not present

## 2023-02-02 DIAGNOSIS — R1084 Generalized abdominal pain: Secondary | ICD-10-CM | POA: Insufficient documentation

## 2023-02-02 DIAGNOSIS — Z79899 Other long term (current) drug therapy: Secondary | ICD-10-CM | POA: Diagnosis not present

## 2023-02-02 DIAGNOSIS — N183 Chronic kidney disease, stage 3 unspecified: Secondary | ICD-10-CM | POA: Diagnosis not present

## 2023-02-02 DIAGNOSIS — E7800 Pure hypercholesterolemia, unspecified: Secondary | ICD-10-CM | POA: Diagnosis not present

## 2023-02-02 DIAGNOSIS — R1111 Vomiting without nausea: Secondary | ICD-10-CM | POA: Diagnosis not present

## 2023-02-02 DIAGNOSIS — Z20822 Contact with and (suspected) exposure to covid-19: Secondary | ICD-10-CM | POA: Insufficient documentation

## 2023-02-03 ENCOUNTER — Emergency Department (HOSPITAL_BASED_OUTPATIENT_CLINIC_OR_DEPARTMENT_OTHER)
Admission: EM | Admit: 2023-02-03 | Discharge: 2023-02-03 | Disposition: A | Discharge status: Home / Self Care | Payer: Medicare HMO | Attending: Emergency Medicine | Admitting: Emergency Medicine

## 2023-02-03 ENCOUNTER — Other Ambulatory Visit: Payer: Self-pay

## 2023-02-03 ENCOUNTER — Encounter (HOSPITAL_BASED_OUTPATIENT_CLINIC_OR_DEPARTMENT_OTHER): Payer: Self-pay

## 2023-02-03 DIAGNOSIS — N289 Disorder of kidney and ureter, unspecified: Secondary | ICD-10-CM

## 2023-02-03 DIAGNOSIS — R112 Nausea with vomiting, unspecified: Secondary | ICD-10-CM

## 2023-02-03 LAB — CBC
HCT: 49 % — ABNORMAL HIGH (ref 36.0–46.0)
Hemoglobin: 16.5 g/dL — ABNORMAL HIGH (ref 12.0–15.0)
MCH: 32.3 pg (ref 26.0–34.0)
MCHC: 33.7 g/dL (ref 30.0–36.0)
MCV: 95.9 fL (ref 80.0–100.0)
Platelets: 249 10*3/uL (ref 150–400)
RBC: 5.11 MIL/uL (ref 3.87–5.11)
RDW: 13.6 % (ref 11.5–15.5)
WBC: 12.1 10*3/uL — ABNORMAL HIGH (ref 4.0–10.5)
nRBC: 0 % (ref 0.0–0.2)

## 2023-02-03 LAB — LIPASE, BLOOD: Lipase: 22 U/L (ref 11–51)

## 2023-02-03 LAB — COMPREHENSIVE METABOLIC PANEL
ALT: 15 U/L (ref 0–44)
AST: 16 U/L (ref 15–41)
Albumin: 4 g/dL (ref 3.5–5.0)
Alkaline Phosphatase: 55 U/L (ref 38–126)
Anion gap: 10 (ref 5–15)
BUN: 18 mg/dL (ref 8–23)
CO2: 29 mmol/L (ref 22–32)
Calcium: 10 mg/dL (ref 8.9–10.3)
Chloride: 102 mmol/L (ref 98–111)
Creatinine, Ser: 1.42 mg/dL — ABNORMAL HIGH (ref 0.44–1.00)
GFR, Estimated: 39 mL/min — ABNORMAL LOW (ref >60)
Glucose, Bld: 190 mg/dL — ABNORMAL HIGH (ref 70–99)
Potassium: 4.3 mmol/L (ref 3.5–5.1)
Sodium: 141 mmol/L (ref 135–145)
Total Bilirubin: 0.6 mg/dL (ref 0.3–1.2)
Total Protein: 7.5 g/dL (ref 6.5–8.1)

## 2023-02-03 LAB — SARS CORONAVIRUS 2 BY RT PCR: SARS Coronavirus 2 by RT PCR: NEGATIVE

## 2023-02-03 MED ORDER — ONDANSETRON HCL 4 MG/2ML IJ SOLN
4.0000 mg | Freq: Once | INTRAMUSCULAR | Status: AC
  Administered 2023-02-03: 4 mg via INTRAVENOUS
  Filled 2023-02-03: qty 2

## 2023-02-03 MED ORDER — LOPERAMIDE HCL 2 MG PO CAPS
4.0000 mg | ORAL_CAPSULE | Freq: Once | ORAL | Status: AC
  Administered 2023-02-03: 4 mg via ORAL
  Filled 2023-02-03: qty 2

## 2023-02-03 MED ORDER — ONDANSETRON 8 MG PO TBDP: 8.0000 mg | ORAL_TABLET | Freq: Three times a day (TID) | ORAL | 0 refills | Status: AC | PRN

## 2023-02-03 MED ORDER — SODIUM CHLORIDE 0.9 % IV BOLUS
1000.0000 mL | Freq: Once | INTRAVENOUS | Status: AC
  Administered 2023-02-03: 1000 mL via INTRAVENOUS

## 2023-02-03 NOTE — ED Triage Notes (Signed)
Pt complaining of nausea, vomiting, diarrhea since 3pm. Having chills as well.

## 2023-02-03 NOTE — ED Provider Notes (Signed)
[place] EMERGENCY DEPARTMENT AT [facility] Provider Note   CSN: [id number] Arrival date & time: 02/02/23  2357     History  Chief Complaint  Patient presents with   Abdominal Pain    Christina [name] is a 72 y.o. female.  The history is provided by the patient.  Abdominal Pain She has history of diabetes, hyperlipidemia, chronic kidney disease and comes in because of vomiting and diarrhea since about 5 PM.  She has had multiple episodes of emesis and diarrhea.  She is also complaining of some generalized abdominal soreness.  She denies fever, chills, sweats.  She denies any sick contacts and denies any suspicious food intake.  She has not done anything at home to treat her symptoms.   Home Medications Prior to Admission medications   Medication Sig Start Date End Date Taking? Authorizing Provider  ACCU-CHEK AVIVA PLUS test strip  01/18/19   [provider]  Accu-Chek Softclix Lancets lancets  01/17/19   [provider]  acetaminophen (TYLENOL) 500 MG tablet Take 1,000 mg by mouth every 6 (six) hours as needed for mild pain.    [provider]  Alcohol Swabs (B-D SINGLE USE SWABS REGULAR) PADS USE TO TEST BLOOD SUGAR ONCE A DAY    [provider]  amLODipine-benazepril (LOTREL) 10-40 MG capsule Take 1 capsule by mouth daily. 12/26/18   [provider]  APPLE CIDER VINEGAR PO Take 3 capsules by mouth daily. Goli Gummy    [provider]  Blood Glucose Calibration (ACCU-CHEK GUIDE CONTROL) LIQD See admin instructions. 09/03/20   [provider]  Blood Glucose Monitoring Suppl (ACCU-CHEK GUIDE) w/Device KIT See admin instructions.    [provider]  Calcium Carbonate (CALCIUM 600 PO) Take 600 mg by mouth daily.    [provider]  cholecalciferol (VITAMIN D3) 25 MCG (1000 UNIT) tablet Take 1,000 Units by mouth daily.    [provider]  hydrocortisone (ANUSOL-HC) 2.5 % rectal cream Place  rectally 2 (two) times daily. Apply around anus for irritated & painful hemorrhoids 08/24/21   Karie Soda, MD  metFORMIN (GLUCOPHAGE-XR) 500 MG 24 hr tablet Take 1,000 mg by mouth daily with breakfast. 04/12/19   [provider]  neomycin-bacitracin-polymyxin (NEOSPORIN) ointment Apply 1 application. topically as needed for wound care.    [provider]  ondansetron (ZOFRAN) 4 MG tablet Take 1 tablet (4 mg total) by mouth every 6 (six) hours. 12/04/21   Curatolo, Adam, DO  OVER THE COUNTER MEDICATION Take 2 capsules by mouth daily. Imnuneti otc supplement    [provider]  Vibegron 75 MG TABS Take 75 mg by mouth daily.    [provider]      Allergies    Penicillins, Rosuvastatin, Sulfa antibiotics, Cholestyramine, Gemfibrozil, and Statins    Review of Systems   Review of Systems  Gastrointestinal:  Positive for abdominal pain.  All other systems reviewed and are negative.   Physical Exam Updated Vital Signs BP 138/68   Pulse 73   Temp 97.7 F (36.5 C) (Oral)   Resp 17   Ht 5\' 2"  (1.575 m)   Wt 95.3 kg   SpO2 98%   BMI 38.41 kg/m  Physical Exam Vitals and nursing note reviewed.   72 year old female, appears uncomfortable, but is in no acute distress. Vital signs are normal. Oxygen saturation is 98%, which is normal. Head is normocephalic and atraumatic. PERRLA, EOMI. Oropharynx is clear. Neck is nontender and supple without  adenopathy or JVD. Back is nontender and there is no CVA tenderness. Lungs are clear without rales, wheezes, or rhonchi. Chest is nontender. Heart has regular rate and rhythm without murmur. Abdomen is soft, flat, with mild tenderness diffusely.  There is no rebound or guarding. Extremities have no cyanosis or edema, full range of motion is present. Skin is warm and dry without rash. Neurologic: Mental status is normal, cranial nerves are intact, moves all extremities equally.  ED Results / Procedures / Treatments    Labs (all labs ordered are listed, but only abnormal results are displayed) Labs Reviewed  COMPREHENSIVE METABOLIC PANEL - Abnormal; Notable for the following components:      Result Value   Glucose, Bld 190 (*)    Creatinine, Ser 1.42 (*)    GFR, Estimated 39 (*)    All other components within normal limits  CBC - Abnormal; Notable for the following components:   WBC 12.1 (*)    Hemoglobin 16.5 (*)    HCT 49.0 (*)    All other components within normal limits  SARS CORONAVIRUS 2 BY RT PCR  LIPASE, BLOOD  URINALYSIS, ROUTINE W REFLEX MICROSCOPIC    EKG None  Radiology No results found.  Procedures Procedures  Cardiac monitor shows normal sinus rhythm, per my interpretation.  Medications Ordered in ED Medications  ondansetron (ZOFRAN) injection 4 mg (has no administration in time range)  sodium chloride 0.9 % bolus 1,000 mL (has no administration in time range)  loperamide (IMODIUM) capsule 4 mg (has no administration in time range)    ED Course/ Medical Decision Making/ A&P                                 Medical Decision Making Amount and/or Complexity of Data Reviewed Labs: ordered.  Risk Prescription drug management.   Nausea, vomiting, diarrhea and pattern most likely representing viral gastroenteritis.  I have low index of suspicion for bowel obstruction or serious intra-abdominal pathology such as diverticulitis, pancreatitis.  I have reviewed her laboratory tests, and my interpretation is renal insufficiency with creatinine having increased moderately since last value on record of 12/04/2021, mild leukocytosis which is nonspecific, high hemoglobin likely secondary to hemoconcentration.  PCR test for COVID-19 is negative.  I have ordered IV fluids, ondansetron for nausea, loperamide for diarrhea.  She feels much better after above-noted treatment.  I am discharging her with a prescription for ondansetron oral dissolving tablet, she is told to use  over-the-counter loperamide as needed.  Return precautions discussed.  Final Clinical Impression(s) / ED Diagnoses Final diagnoses:  Nausea vomiting and diarrhea  Renal insufficiency    Rx / DC Orders ED Discharge Orders          Ordered    ondansetron (ZOFRAN-ODT) 8 MG disintegrating tablet  Every 8 hours PRN        02/03/23 0439              Dione Booze, MD 02/03/23 [phone]

## 2023-02-03 NOTE — Discharge Instructions (Addendum)
Take loperamide as needed for diarrhea. Return if symptoms are getting worse.

## 2023-02-09 DIAGNOSIS — Z1231 Encounter for screening mammogram for malignant neoplasm of breast: Secondary | ICD-10-CM | POA: Diagnosis not present

## 2023-02-11 DIAGNOSIS — Z6838 Body mass index (BMI) 38.0-38.9, adult: Secondary | ICD-10-CM | POA: Diagnosis not present

## 2023-02-11 DIAGNOSIS — R899 Unspecified abnormal finding in specimens from other organs, systems and tissues: Secondary | ICD-10-CM | POA: Diagnosis not present

## 2023-02-11 DIAGNOSIS — Z09 Encounter for follow-up examination after completed treatment for conditions other than malignant neoplasm: Secondary | ICD-10-CM | POA: Diagnosis not present

## 2023-02-11 DIAGNOSIS — M545 Low back pain, unspecified: Secondary | ICD-10-CM | POA: Diagnosis not present

## 2023-02-11 DIAGNOSIS — Z658 Other specified problems related to psychosocial circumstances: Secondary | ICD-10-CM | POA: Diagnosis not present

## 2023-02-11 DIAGNOSIS — E860 Dehydration: Secondary | ICD-10-CM | POA: Diagnosis not present

## 2023-02-18 DIAGNOSIS — N3946 Mixed incontinence: Secondary | ICD-10-CM | POA: Diagnosis not present

## 2023-02-18 DIAGNOSIS — R350 Frequency of micturition: Secondary | ICD-10-CM | POA: Diagnosis not present

## 2023-03-12 DIAGNOSIS — L720 Epidermal cyst: Secondary | ICD-10-CM | POA: Diagnosis not present

## 2023-03-12 DIAGNOSIS — N907 Vulvar cyst: Secondary | ICD-10-CM | POA: Diagnosis not present

## 2023-04-20 ENCOUNTER — Encounter: Payer: Self-pay | Admitting: Podiatry

## 2023-04-20 ENCOUNTER — Ambulatory Visit (INDEPENDENT_AMBULATORY_CARE_PROVIDER_SITE_OTHER): Payer: Medicare HMO | Admitting: Podiatry

## 2023-04-20 DIAGNOSIS — M79675 Pain in left toe(s): Secondary | ICD-10-CM

## 2023-04-20 DIAGNOSIS — L84 Corns and callosities: Secondary | ICD-10-CM | POA: Diagnosis not present

## 2023-04-20 DIAGNOSIS — E1122 Type 2 diabetes mellitus with diabetic chronic kidney disease: Secondary | ICD-10-CM

## 2023-04-20 DIAGNOSIS — N1831 Chronic kidney disease, stage 3a: Secondary | ICD-10-CM | POA: Diagnosis not present

## 2023-04-20 DIAGNOSIS — M79674 Pain in right toe(s): Secondary | ICD-10-CM | POA: Diagnosis not present

## 2023-04-20 DIAGNOSIS — B351 Tinea unguium: Secondary | ICD-10-CM | POA: Diagnosis not present

## 2023-04-25 NOTE — Progress Notes (Signed)
Subjective:  Patient ID: Christina [name], female    DOB: 1951/03/02,  MRN: [id number]  72 y.o. female presents to clinic with  at risk foot care. Pt has h/o NIDDM with chronic kidney disease and callus(es) b/l feet and painful thick toenails that are difficult to trim. Painful toenails interfere with ambulation. Aggravating factors include wearing enclosed shoe gear. Pain is relieved with periodic professional debridement. Painful calluses are aggravated when weightbearing with and without shoegear. Pain is relieved with periodic professional debridement.  Chief Complaint  Patient presents with   Diabetes    PATIENT STATES SHE DOES NOT REMEMBER WHEN SHE LAST SAW HER PCP , PATIENT DID NOT CHECK A1C TODAY PATIENT STATES    New problem(s): None   PCP is Daisy Floro, MD.  Allergies  Allergen Reactions   Penicillins Dermatitis and Rash    Tolerated Cephalosporin Date: 04/01/21. rash/ skin peel/ and nails come off Other reaction(s): rash/ skin peel/ and nails come off   Rosuvastatin     Cramps (ALLERGY/intolerance), muscle pain Other reaction(s): muscle pain   Sulfa Antibiotics Nausea And Vomiting    Muscle Cramps   Cholestyramine Diarrhea   Gemfibrozil Diarrhea and Nausea And Vomiting   Statins     Cramps, muscle pain    Review of Systems: Negative except as noted in the HPI.   Objective:  Christina [name] is a pleasant 72 y.o. female in NAD.Marland Kitchen AAO x 3.  Vascular Examination: CFT <3 seconds b/l LE. Palpable DP pulse(s) b/l LE. Palpable PT pulse(s) b/l LE. Pedal hair sparse. Trace edema noted BLE. No varicosities noted. No cyanosis or clubbing noted b/l LE.  Neurological Examination: Sensation grossly intact b/l with 10 gram monofilament. Vibratory sensation intact b/l.   Dermatological Examination: Pedal skin with normal turgor, texture and tone b/l. Toenails 1-5 b/l thick, discolored, elongated with subungual debris and pain on dorsal palpation. Hyperkeratotic lesion(s) submet  head 5 b/l.  No erythema, no edema, no drainage, no fluctuance.  Musculoskeletal Examination: Muscle strength 5/5 to b/l LE. HAV with bunion deformity noted b/l LE. Hammertoe(s) noted to the bilateral 5th toes.  Radiographs: None  Last A1c:       No data to display           Assessment:   1. Pain due to onychomycosis of toenails of both feet   2. Callus of foot   3. Type 2 diabetes mellitus with stage 3a chronic kidney disease, without long-term current use of insulin (HCC)     Plan:  -Consent given for treatment as described below: -Examined patient. -Continue supportive shoe gear daily. -Toenails 1-5 b/l were debrided in length and girth with sterile nail nippers and dremel without iatrogenic bleeding.  -Callus(es) submet head 5 left foot and submet head 5 right foot pared utilizing sterile scalpel blade without complication or incident. Total number debrided =2. -Patient/POA to call should there be question/concern in the interim.  Return in about 3 months (around 07/19/2023).  Christina [name], DPM      [place] LOCATION: [address], [place] [address]                   Office [phone]   Christina [name]  LOCATION: [address] [place], [place] [address] Office [phone]

## 2023-05-06 DIAGNOSIS — J329 Chronic sinusitis, unspecified: Secondary | ICD-10-CM | POA: Diagnosis not present

## 2023-05-06 DIAGNOSIS — K5900 Constipation, unspecified: Secondary | ICD-10-CM | POA: Diagnosis not present

## 2023-05-06 DIAGNOSIS — J40 Bronchitis, not specified as acute or chronic: Secondary | ICD-10-CM | POA: Diagnosis not present

## 2023-05-06 DIAGNOSIS — R058 Other specified cough: Secondary | ICD-10-CM | POA: Diagnosis not present

## 2023-06-29 DIAGNOSIS — E1169 Type 2 diabetes mellitus with other specified complication: Secondary | ICD-10-CM | POA: Diagnosis not present

## 2023-06-29 DIAGNOSIS — Z0000 Encounter for general adult medical examination without abnormal findings: Secondary | ICD-10-CM | POA: Diagnosis not present

## 2023-06-29 DIAGNOSIS — K5900 Constipation, unspecified: Secondary | ICD-10-CM | POA: Diagnosis not present

## 2023-06-29 DIAGNOSIS — G720 Drug-induced myopathy: Secondary | ICD-10-CM | POA: Diagnosis not present

## 2023-06-29 DIAGNOSIS — I10 Essential (primary) hypertension: Secondary | ICD-10-CM | POA: Diagnosis not present

## 2023-06-29 DIAGNOSIS — N183 Chronic kidney disease, stage 3 unspecified: Secondary | ICD-10-CM | POA: Diagnosis not present

## 2023-06-29 DIAGNOSIS — E7800 Pure hypercholesterolemia, unspecified: Secondary | ICD-10-CM | POA: Diagnosis not present

## 2023-07-01 ENCOUNTER — Other Ambulatory Visit (HOSPITAL_COMMUNITY): Payer: Self-pay | Admitting: Family Medicine

## 2023-07-01 DIAGNOSIS — E7800 Pure hypercholesterolemia, unspecified: Secondary | ICD-10-CM

## 2023-07-13 ENCOUNTER — Ambulatory Visit (HOSPITAL_COMMUNITY)
Admission: RE | Admit: 2023-07-13 | Discharge: 2023-07-13 | Disposition: A | Discharge status: Home / Self Care | Payer: Self-pay | Source: Ambulatory Visit | Attending: Family Medicine | Admitting: Family Medicine

## 2023-07-13 DIAGNOSIS — E7800 Pure hypercholesterolemia, unspecified: Secondary | ICD-10-CM | POA: Insufficient documentation

## 2023-07-21 ENCOUNTER — Encounter: Payer: Self-pay | Admitting: Podiatry

## 2023-07-21 ENCOUNTER — Ambulatory Visit: Payer: Medicare HMO | Admitting: Podiatry

## 2023-07-21 DIAGNOSIS — E1122 Type 2 diabetes mellitus with diabetic chronic kidney disease: Secondary | ICD-10-CM

## 2023-07-21 DIAGNOSIS — L84 Corns and callosities: Secondary | ICD-10-CM

## 2023-07-21 DIAGNOSIS — M79675 Pain in left toe(s): Secondary | ICD-10-CM | POA: Diagnosis not present

## 2023-07-21 DIAGNOSIS — N1831 Chronic kidney disease, stage 3a: Secondary | ICD-10-CM

## 2023-07-21 DIAGNOSIS — B351 Tinea unguium: Secondary | ICD-10-CM

## 2023-07-21 DIAGNOSIS — M79674 Pain in right toe(s): Secondary | ICD-10-CM | POA: Diagnosis not present

## 2023-07-26 ENCOUNTER — Encounter: Payer: Self-pay | Admitting: Podiatry

## 2023-07-26 NOTE — Progress Notes (Signed)
 Subjective:  Patient ID: Christina [name], female    DOB: 1950-09-26,  MRN: [id number]  Christina [name] presents to clinic today for at risk foot care. Pt has h/o NIDDM with chronic kidney disease and callus(es) of both feet and painful mycotic toenails that are difficult to trim. Painful toenails interfere with ambulation. Aggravating factors include wearing enclosed shoe gear. Pain is relieved with periodic professional debridement. Painful calluses are aggravated when weightbearing with and without shoegear. Pain is relieved with periodic professional debridement.  Chief Complaint  Patient presents with   Diabetes    "I've come to get my toenails and calluses done.  Saw Dr. Tenny Craw - 2 or 3 weeks ago.  A1c - 6.4   New problem(s): Patient states she has a corn on her 5th toe right foot.  PCP is Christina Floro, MD.  Allergies  Allergen Reactions   Penicillins Dermatitis and Rash    Tolerated Cephalosporin Date: 04/01/21. rash/ skin peel/ and nails come off Other reaction(s): rash/ skin peel/ and nails come off   Rosuvastatin     Cramps (ALLERGY/intolerance), muscle pain Other reaction(s): muscle pain   Sulfa Antibiotics Nausea And Vomiting    Muscle Cramps   Cholestyramine Diarrhea   Gemfibrozil Diarrhea and Nausea And Vomiting   Statins     Cramps, muscle pain    Review of Systems: Negative except as noted in the HPI.  Objective: No changes noted in today's physical examination. There were no vitals filed for this visit. Christina [name] is a pleasant 73 y.o. female obese in NAD. AAO x 3.  Vascular Examination: CFT <3 seconds b/l LE. Palpable DP pulse(s) b/l LE. Palpable PT pulse(s) b/l LE. Pedal hair sparse. Trace edema noted BLE. No varicosities noted. No cyanosis or clubbing noted b/l LE.  Neurological Examination: Sensation grossly intact b/l with 10 gram monofilament. Vibratory sensation intact b/l.   Dermatological Examination: Pedal skin with normal turgor, texture and  tone b/l. Toenails 1-5 b/l thick, discolored, elongated with subungual debris and pain on dorsal palpation. Hyperkeratotic lesion(s) submet head 5 b/l and dorsal aspect of right 5th toe.  No erythema, no edema, no drainage, no fluctuance.  Musculoskeletal Examination: Muscle strength 5/5 to b/l LE. HAV with bunion deformity noted b/l LE. Hammertoe(s) noted to the bilateral 5th toes.  Radiographs: None  Assessment/Plan: 1. Pain due to onychomycosis of toenails of both feet   2. Corns and callosities   3. Type 2 diabetes mellitus with stage 3a chronic kidney disease, without long-term current use of insulin (HCC)    Consent given for treatment. Patient examined. All patient's and/or POA's questions/concerns addressed on today's visit. Mycotic toenails 1-5 debrided in length and girth without incident. Corn(s)/callus(es) R 5th toe and submet head 5 b/l pared with sharp debridement without incident. Continue foot and shoe inspections daily. Monitor blood glucose per PCP/Endocrinologist's recommendations.Continue soft, supportive shoe gear daily. Report any pedal injuries to medical professional. Call office if there are any quesitons/concerns. -Patient/POA to call should there be question/concern in the interim.   Return in about 3 months (around 10/21/2023).  Freddie Breech, DPM      [place] LOCATION: [address], [place] [address]  Office [phone]   [facility] LOCATION: [address] [place], [place] [address] Office [phone]

## 2023-08-19 DIAGNOSIS — R350 Frequency of micturition: Secondary | ICD-10-CM | POA: Diagnosis not present

## 2023-08-19 DIAGNOSIS — N3946 Mixed incontinence: Secondary | ICD-10-CM | POA: Diagnosis not present

## 2023-09-22 ENCOUNTER — Other Ambulatory Visit (HOSPITAL_COMMUNITY): Payer: Self-pay

## 2023-10-14 DIAGNOSIS — H18523 Epithelial (juvenile) corneal dystrophy, bilateral: Secondary | ICD-10-CM | POA: Diagnosis not present

## 2023-10-20 ENCOUNTER — Encounter: Payer: Self-pay | Admitting: Podiatry

## 2023-10-20 ENCOUNTER — Ambulatory Visit: Payer: Medicare HMO | Admitting: Podiatry

## 2023-10-20 DIAGNOSIS — E119 Type 2 diabetes mellitus without complications: Secondary | ICD-10-CM | POA: Diagnosis not present

## 2023-10-20 DIAGNOSIS — M2012 Hallux valgus (acquired), left foot: Secondary | ICD-10-CM

## 2023-10-20 DIAGNOSIS — N1831 Chronic kidney disease, stage 3a: Secondary | ICD-10-CM

## 2023-10-20 DIAGNOSIS — M79675 Pain in left toe(s): Secondary | ICD-10-CM | POA: Diagnosis not present

## 2023-10-20 DIAGNOSIS — E1122 Type 2 diabetes mellitus with diabetic chronic kidney disease: Secondary | ICD-10-CM | POA: Diagnosis not present

## 2023-10-20 DIAGNOSIS — M2041 Other hammer toe(s) (acquired), right foot: Secondary | ICD-10-CM | POA: Diagnosis not present

## 2023-10-20 DIAGNOSIS — L84 Corns and callosities: Secondary | ICD-10-CM

## 2023-10-20 DIAGNOSIS — B351 Tinea unguium: Secondary | ICD-10-CM

## 2023-10-20 DIAGNOSIS — M79674 Pain in right toe(s): Secondary | ICD-10-CM | POA: Diagnosis not present

## 2023-10-20 DIAGNOSIS — M21612 Bunion of left foot: Secondary | ICD-10-CM

## 2023-10-25 NOTE — Progress Notes (Signed)
 ANNUAL DIABETIC FOOT EXAM  Subjective: Christina [name] presents today for annual diabetic foot exam.  Chief Complaint  Patient presents with   [facility]    Rm 17 DFC/ diabetic/A1c 6.4/ Dr. Jarold does not remember last visit.   Patient confirms h/o diabetes.  Patient denies any h/o foot wounds.  Christina Carlin Redbird, MD is patient's PCP.  LOV 06/29/2023.  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   SBO (small bowel obstruction) (HCC) 05/29/2021   Acute gastroenteritis 05/29/2021   Type 2 diabetes mellitus with stage 3 chronic kidney disease (HCC) 05/29/2021   Hypokalemia 05/29/2021   Pain in lower limb 05/20/2021   Primary osteoarthritis of left knee 03/31/2021   Encounter for orthopedic follow-up care 01/02/2021   Mass of skin of left forearm 12/16/2020   Pain in joint of right hip 06/10/2020   Pain in joint of left knee 06/10/2020   Stage 3a chronic kidney disease (CKD) (HCC) 05/08/2020   Chronic pain 05/08/2020   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   Hardening of the aorta (main artery of the heart) (HCC) 05/08/2020   Hypercholesterolemia 05/08/2020   Morbid obesity (HCC) 05/08/2020   Myalgia 05/08/2020   Osteopenia 05/08/2020   Sciatica 05/08/2020   Urgency of urination 05/08/2020   Urinary incontinence 05/08/2020   Statin not tolerated 05/08/2020   Adhesive capsulitis of left shoulder 08/08/2019   Mass of joint of left hand 02/15/2019   Mass of joint of left wrist 02/15/2019   Abdominal pain, vomiting, and diarrhea 01/12/2019   Renal insufficiency 01/12/2019   Adrenal nodule (HCC)  01/12/2019   Pain in thumb joint with movement, right 12/28/2018   Pain of left hand 12/28/2018   Degenerative scoliosis 05/23/2018   Degenerative spondylolisthesis 05/23/2018   Constipation 09/07/2013   Surgical wound infection 08/08/2013   Diabetes mellitus without complication (HCC)    Hypertension    OA (osteoarthritis) of knee 08/02/2013   Total knee replacement status 08/02/2013   History of colon cancer 06/19/2010   Personal history of other malignant neoplasm of large intestine 06/19/2010   Past Surgical History:  Procedure Laterality Date   BREAST SURGERY     RIGHT AND LEFT BREAST TUMORS REMOVED - BENIGN age 89 and 47   C-SECTIONS X 2  1981   1991   COLON SURGERY  2015   CYST EXCISION Left 2020   hand and ganglian cyst and 2021 Lt arm   ECTOPIC PREGNANCY SURGERY  1986   FATTY TUMOR REMOVED FROM RIGHT HEEL     age 25   LAPAROSCOPY N/A 08/19/2021   Procedure: LAPAROSCOPIC LYSIS OF ADHESIONS AND REPAIR OF ENTEROTOMY;  Surgeon: Lyndel Deward PARAS, MD;  Location: [facility];  Service: General;  Laterality: N/A;   TONSILLECTOMY     AT AGE OF 12   TOTAL KNEE ARTHROPLASTY Right 08/02/2013   Procedure: RIGHT TOTAL KNEE ARTHROPLASTY;  Surgeon: Tanda DELENA Heading, MD;  Location: [facility];  Service: Orthopedics;  Laterality: Right;   TOTAL KNEE ARTHROPLASTY Left 03/31/2021   Procedure: TOTAL KNEE ARTHROPLASTY;  Surgeon: Melodi Lerner, MD;  Location: [facility];  Service: Orthopedics;  Laterality: Left;   UMBILICAL HERNIA REPAIR N/A 08/19/2021   Procedure: PRIMARY REPAIR OF PERIUMBILICAL INCISIONAL HERNIA;  Surgeon: Lyndel Deward PARAS, MD;  Location: [facility];  Service: General;  Laterality: N/A;   Current Outpatient Medications on File Prior to Visit  Medication Sig Dispense Refill   ACCU-CHEK AVIVA PLUS test strip      Accu-Chek Softclix Lancets lancets      acetaminophen  (TYLENOL ) 500 MG tablet Take 1,000 mg by mouth every 6 (six) hours as needed for mild pain.     Alcohol Swabs  (B-D  SINGLE USE SWABS  REGULAR) PADS USE TO TEST BLOOD SUGAR ONCE A DAY     amLODipine -benazepril  (LOTREL) 10-40 MG capsule Take 1 capsule by mouth daily.     APPLE CIDER VINEGAR PO Take 3 capsules by mouth daily. Goli Gummy     Blood Glucose Calibration (ACCU-CHEK GUIDE CONTROL) LIQD See admin instructions.     Blood Glucose Monitoring Suppl (ACCU-CHEK GUIDE) w/Device KIT See admin instructions.     Calcium  Carbonate (CALCIUM  600 PO) Take 600 mg by mouth daily.     cholecalciferol  (VITAMIN D3) 25 MCG (1000 UNIT) tablet Take 1,000 Units by mouth daily.     hydrocortisone  (ANUSOL -HC) 2.5 % rectal cream Place rectally 2 (two) times daily. Apply around anus for irritated & painful hemorrhoids 15 g 2   metFORMIN  (GLUCOPHAGE -XR) 500 MG 24 hr tablet Take 1,000 mg by mouth daily with breakfast.     neomycin-bacitracin -polymyxin (NEOSPORIN) ointment Apply 1 application. topically as needed for wound care.     ondansetron  (ZOFRAN ) 4 MG tablet Take 1 tablet (4 mg total) by mouth every 6 (six) hours. 12 tablet 0   ondansetron  (ZOFRAN -ODT) 8 MG disintegrating tablet Take 1 tablet (8 mg total) by mouth every 8 (eight) hours as needed for nausea or vomiting. 20 tablet 0   OVER THE COUNTER MEDICATION Take 2 capsules by mouth daily. Imnuneti otc supplement     Vibegron  75 MG TABS Take 75 mg by mouth daily.     No current facility-administered medications on file prior to visit.    Allergies  Allergen Reactions   Penicillins Dermatitis and Rash    Tolerated Cephalosporin Date: 04/01/21. rash/ skin peel/ and nails come off Other reaction(s): rash/ skin peel/ and nails come off   Rosuvastatin     Cramps (ALLERGY/intolerance), muscle pain Other reaction(s): muscle pain   Sulfa Antibiotics Nausea And Vomiting    Muscle Cramps   Cholestyramine Diarrhea   Gemfibrozil Diarrhea and Nausea And Vomiting   Statins     Cramps, muscle pain   Social History   Occupational History   Not on file  Tobacco Use    Smoking status: Never   Smokeless tobacco: Never  Vaping Use   Vaping status: Never Used  Substance and Sexual Activity   Alcohol use: No   Drug use: No   Sexual activity: Not on file   Family History  Problem Relation Age of Onset   Hypertension Mother    Heart attack Mother    Immunization History  Administered Date(s) Administered   Fluzone Influenza virus vaccine,trivalent (IIV3), split virus 03/19/2009   Influenza,inj,Quad PF,6+ Mos 02/24/2016, 03/15/2017   Moderna Sars-Covid-2 Vaccination 06/16/2019, 07/14/2019, 03/06/2020   Pneumococcal Conjugate-13 05/23/2015   Pneumococcal Polysaccharide-23 03/19/2009   Td 01/01/2017   Tdap 04/04/2007   Zoster, Live 10/06/2012     Review of Systems: Negative except as  noted in the HPI.   Objective: There were no vitals filed for this visit.  Christina [name] is a pleasant 73 y.o. female in NAD. AAO X 3.  Diabetic foot exam was performed with the following findings:   Vascular Examination: Capillary refill time immediate b/l. Palpable pedal pulses. Pedal hair present b/l. No pain with calf compression b/l. Skin temperature gradient WNL b/l. No cyanosis or clubbing b/l. No ischemia or gangrene noted b/l. Trace edema noted BLE.  Neurological Examination: Sensation grossly intact b/l with 10 gram monofilament. Vibratory sensation intact b/l.   Dermatological Examination: Pedal skin with normal turgor, texture and tone b/l.  No open wounds. No interdigital macerations.   Toenails 1-5 b/l thick, discolored, elongated with subungual debris and pain on dorsal palpation.   Hyperkeratotic lesion(s) dorsal PIPJ of R 5th toe and submet head 5 b/l.  No erythema, no edema, no drainage, no fluctuance.  Musculoskeletal Examination: Muscle strength 5/5 to all lower extremity muscle groups bilaterally. HAV with bunion deformity noted b/l LE. Hammertoe(s) b/l 5th toes.. No pain, crepitus or joint limitation noted with ROM b/l LE.  Patient ambulates  independently without assistive aids.  Radiographs: None     Lab Results  Component Value Date   HGBA1C 6.0 (H) 07/22/2021  ADA Risk Categorization: Low Risk :  Patient has all of the following: Intact protective sensation No prior foot ulcer  No severe deformity Pedal pulses present  Assessment: 1. Pain due to onychomycosis of toenails of both feet   2. Corns and callosities   3. Hallux valgus with bunions, left   4. Hammertoe of right foot   5. Type 2 diabetes mellitus with stage 3a chronic kidney disease, without long-term current use of insulin  (HCC)   6. Encounter for diabetic foot exam (HCC)    Plan: Diabetic foot examination performed today.  All patient's and/or POA's questions/concerns addressed on today's visit. Toenails 1-5 debrided in length and girth without incident. Corn(s) dorsal PIPJ of R 5th toe pared with sharp debridement without incident. Continue daily foot inspections and monitor blood glucose per PCP/Endocrinologist's recommendations. Continue soft, supportive shoe gear daily. Report any pedal injuries to medical professional. Call office if there are any questions/concerns. -Patient/POA to call should there be question/concern in the interim. Return in about 3 months (around 01/20/2024).  Delon LITTIE Merlin, DPM      [place] LOCATION: [address], [place] [address]                   Office [phone]   [facility] LOCATION: [address] [place], [place] [address] Office [phone]

## 2023-12-28 DIAGNOSIS — Z6837 Body mass index (BMI) 37.0-37.9, adult: Secondary | ICD-10-CM | POA: Diagnosis not present

## 2023-12-28 DIAGNOSIS — R935 Abnormal findings on diagnostic imaging of other abdominal regions, including retroperitoneum: Secondary | ICD-10-CM | POA: Diagnosis not present

## 2023-12-28 DIAGNOSIS — L659 Nonscarring hair loss, unspecified: Secondary | ICD-10-CM | POA: Diagnosis not present

## 2023-12-28 DIAGNOSIS — E1169 Type 2 diabetes mellitus with other specified complication: Secondary | ICD-10-CM | POA: Diagnosis not present

## 2023-12-28 DIAGNOSIS — Z23 Encounter for immunization: Secondary | ICD-10-CM | POA: Diagnosis not present

## 2023-12-30 ENCOUNTER — Other Ambulatory Visit: Payer: Self-pay | Admitting: Family Medicine

## 2023-12-30 DIAGNOSIS — R935 Abnormal findings on diagnostic imaging of other abdominal regions, including retroperitoneum: Secondary | ICD-10-CM

## 2024-01-17 DIAGNOSIS — H18523 Epithelial (juvenile) corneal dystrophy, bilateral: Secondary | ICD-10-CM | POA: Diagnosis not present

## 2024-01-28 ENCOUNTER — Ambulatory Visit
Admission: RE | Admit: 2024-01-28 | Discharge: 2024-01-28 | Disposition: A | Discharge status: Home / Self Care | Source: Ambulatory Visit | Attending: Family Medicine | Admitting: Family Medicine

## 2024-01-28 DIAGNOSIS — R935 Abnormal findings on diagnostic imaging of other abdominal regions, including retroperitoneum: Secondary | ICD-10-CM

## 2024-01-28 DIAGNOSIS — K862 Cyst of pancreas: Secondary | ICD-10-CM | POA: Diagnosis not present

## 2024-01-28 MED ORDER — GADOPICLENOL 0.5 MMOL/ML IV SOLN
9.0000 mL | Freq: Once | INTRAVENOUS | Status: AC | PRN
  Administered 2024-01-28: 9 mL via INTRAVENOUS

## 2024-02-08 ENCOUNTER — Encounter: Payer: Self-pay | Admitting: Podiatry

## 2024-02-08 ENCOUNTER — Ambulatory Visit: Admitting: Podiatry

## 2024-02-08 DIAGNOSIS — M79674 Pain in right toe(s): Secondary | ICD-10-CM

## 2024-02-08 DIAGNOSIS — E1122 Type 2 diabetes mellitus with diabetic chronic kidney disease: Secondary | ICD-10-CM | POA: Diagnosis not present

## 2024-02-08 DIAGNOSIS — N1831 Chronic kidney disease, stage 3a: Secondary | ICD-10-CM | POA: Diagnosis not present

## 2024-02-08 DIAGNOSIS — L84 Corns and callosities: Secondary | ICD-10-CM | POA: Diagnosis not present

## 2024-02-08 DIAGNOSIS — M79675 Pain in left toe(s): Secondary | ICD-10-CM | POA: Diagnosis not present

## 2024-02-08 DIAGNOSIS — B351 Tinea unguium: Secondary | ICD-10-CM | POA: Diagnosis not present

## 2024-02-13 NOTE — Progress Notes (Signed)
 Subjective:  Patient ID: Kent Braunschweig, female    DOB: 1951/04/02,  MRN: [id number]  Keyna Blizard presents to clinic today for at risk foot care. Pt has h/o NIDDM with chronic kidney disease and corn(s) right foot, callus(es) of both feet and painful elongated, discolored, dystrophic nails.  Pain interferes with ambulation. Aggravating factors include wearing enclosed shoe gear. Painful toenails interfere with ambulation. Aggravating factors include wearing enclosed shoe gear. Pain is relieved with periodic professional debridement. Painful corns and calluses are aggravated when weightbearing with and without shoegear. Pain is relieved with periodic professional debridement.  Chief Complaint  Patient presents with   Diabetes    Patient stated that her last A1c was 6.4, she last saw her PCP in September 2025, she stated that her PCP name is Carlin Gull   New problem(s): None.   PCP is Gull Carlin Redbird, MD.  Allergies  Allergen Reactions   Penicillins Dermatitis and Rash    Tolerated Cephalosporin Date: 04/01/21. rash/ skin peel/ and nails come off Other reaction(s): rash/ skin peel/ and nails come off   Rosuvastatin     Cramps (ALLERGY/intolerance), muscle pain Other reaction(s): muscle pain   Sulfa Antibiotics Nausea And Vomiting    Muscle Cramps   Cholestyramine Diarrhea   Gemfibrozil Diarrhea and Nausea And Vomiting   Statins     Cramps, muscle pain    Review of Systems: Negative except as noted in the HPI.  Objective: No changes noted in today's physical examination. There were no vitals filed for this visit. Luana Tatro is a pleasant 73 y.o. female in NAD. AAO x 3.  Vascular Examination: Capillary refill time immediate b/l. Vascular status intact b/l with palpable pedal pulses. Pedal hair present b/l. No pain with calf compression b/l. Skin temperature gradient WNL b/l. No cyanosis or clubbing b/l. No ischemia or gangrene noted b/l. Trace edema noted BLE.  Neurological  Examination: Sensation grossly intact b/l with 10 gram monofilament. Vibratory sensation intact b/l.   Dermatological Examination: Pedal skin with normal turgor, texture and tone b/l.  No open wounds. No interdigital macerations.   Toenails 1-5 b/l thick, discolored, elongated with subungual debris and pain on dorsal palpation.   Hyperkeratotic lesion(s) dorsal PIPJ of right fifth digit and submet head 5 b/l.  No erythema, no edema, no drainage, no fluctuance.  Musculoskeletal Examination: Muscle strength 5/5 to all lower extremity muscle groups bilaterally. HAV with bunion deformity noted b/l LE. Hammertoe(s) b/l 5th toes.  Radiographs: None  Assessment/Plan: 1. Pain due to onychomycosis of toenails of both feet   2. Corns and callosities   3. Type 2 diabetes mellitus with stage 3a chronic kidney disease, without long-term current use of insulin  (HCC)   -Patient was evaluated today. All questions/concerns addressed on today's visit. -Continue foot and shoe inspections daily. Monitor blood glucose per PCP/Endocrinologist's recommendations. -Patient to continue soft, supportive shoe gear daily. -Mycotic toenails 1-5 bilaterally were debrided in length and girth with sterile nail nippers and dremel without incident. -Corn(s) dorsal PIPJ of R 5th toe pared utilizing sterile scalpel blade without complication or incident. Total number debrided=1. -Callus(es) submet head 5 b/l pared utilizing sterile scalpel blade without complication or incident. Total number debrided =2. -Patient/POA to call should there be question/concern in the interim.   No follow-ups on file.  Delon LITTIE Merlin, DPM      [facility] LOCATION: [address]  [place], [place] [address]                   Office [phone]   [facility] LOCATION: [address] [place], [place] [address] Office [phone]

## 2024-02-15 DIAGNOSIS — Z1231 Encounter for screening mammogram for malignant neoplasm of breast: Secondary | ICD-10-CM | POA: Diagnosis not present

## 2024-02-28 DIAGNOSIS — H26493 Other secondary cataract, bilateral: Secondary | ICD-10-CM | POA: Diagnosis not present

## 2024-02-28 DIAGNOSIS — E119 Type 2 diabetes mellitus without complications: Secondary | ICD-10-CM | POA: Diagnosis not present

## 2024-03-17 DIAGNOSIS — R351 Nocturia: Secondary | ICD-10-CM | POA: Diagnosis not present

## 2024-03-17 DIAGNOSIS — R350 Frequency of micturition: Secondary | ICD-10-CM | POA: Diagnosis not present

## 2024-03-17 DIAGNOSIS — N3281 Overactive bladder: Secondary | ICD-10-CM | POA: Diagnosis not present

## 2024-03-17 DIAGNOSIS — N281 Cyst of kidney, acquired: Secondary | ICD-10-CM | POA: Diagnosis not present

## 2024-03-17 DIAGNOSIS — R399 Unspecified symptoms and signs involving the genitourinary system: Secondary | ICD-10-CM | POA: Diagnosis not present

## 2024-03-23 DIAGNOSIS — N281 Cyst of kidney, acquired: Secondary | ICD-10-CM | POA: Diagnosis not present

## 2024-03-24 DIAGNOSIS — M85852 Other specified disorders of bone density and structure, left thigh: Secondary | ICD-10-CM | POA: Diagnosis not present

## 2024-04-04 ENCOUNTER — Other Ambulatory Visit: Payer: Self-pay | Admitting: Urology

## 2024-04-04 DIAGNOSIS — N281 Cyst of kidney, acquired: Secondary | ICD-10-CM

## 2024-04-24 ENCOUNTER — Other Ambulatory Visit

## 2024-04-24 NOTE — Progress Notes (Incomplete)
 "      Chief Complaint: Patient was seen in consultation today for No chief complaint on file.  at the request of Winter,Christopher Beverley  Referring Physician(s): Winter,Christopher Beverley  History of Present Illness: Christina [name] is a 73 y.o. female ***  Past Medical History:  Diagnosis Date   Arthritis    OA RIGHT KNEE AND PAIN   Cancer (HCC) 05/04/2009   HX OF COLON CANCER; S/P SURGERY AND DID NOT HAVE TO HAVE CHEMO OR RADIATION   Diabetes mellitus without complication (HCC)    Hypertension     Past Surgical History:  Procedure Laterality Date   BREAST SURGERY     RIGHT AND LEFT BREAST TUMORS REMOVED - BENIGN age 84 and 19   C-SECTIONS X 2  1981   1991   COLON SURGERY  2015   CYST EXCISION Left 2020   hand and ganglian cyst and 2021 Lt arm   ECTOPIC PREGNANCY SURGERY  1986   FATTY TUMOR REMOVED FROM RIGHT HEEL     age 56   LAPAROSCOPY N/A 08/19/2021   Procedure: LAPAROSCOPIC LYSIS OF ADHESIONS AND REPAIR OF ENTEROTOMY;  Surgeon: Lyndel Deward PARAS, MD;  Location: [facility];  Service: General;  Laterality: N/A;   TONSILLECTOMY     AT AGE OF 12   TOTAL KNEE ARTHROPLASTY Right 08/02/2013   Procedure: RIGHT TOTAL KNEE ARTHROPLASTY;  Surgeon: Tanda DELENA Heading, MD;  Location: [facility];  Service: Orthopedics;  Laterality: Right;   TOTAL KNEE ARTHROPLASTY Left 03/31/2021   Procedure: TOTAL KNEE ARTHROPLASTY;  Surgeon: Melodi Lerner, MD;  Location: [facility];  Service: Orthopedics;  Laterality: Left;   UMBILICAL HERNIA REPAIR N/A 08/19/2021   Procedure: PRIMARY REPAIR OF PERIUMBILICAL INCISIONAL HERNIA;  Surgeon: Lyndel Deward PARAS, MD;  Location: [facility];  Service: General;  Laterality: N/A;    Allergies: Penicillins, Rosuvastatin, Sulfa antibiotics, Cholestyramine, Gemfibrozil, and Statins  Medications: Prior to Admission medications  Medication Sig Start Date End Date Taking? Authorizing Provider  ACCU-CHEK AVIVA PLUS test strip  01/18/19   [provider]   Accu-Chek Softclix Lancets lancets  01/17/19   [provider]  acetaminophen  (TYLENOL ) 500 MG tablet Take 1,000 mg by mouth every 6 (six) hours as needed for mild pain.    [provider]  Alcohol Swabs  (B-D SINGLE USE SWABS  REGULAR) PADS USE TO TEST BLOOD SUGAR ONCE A DAY    [provider]  amLODipine -benazepril  (LOTREL) 10-40 MG capsule Take 1 capsule by mouth daily. 12/26/18   [provider]  APPLE CIDER VINEGAR PO Take 3 capsules by mouth daily. Goli Gummy    [provider]  Blood Glucose Calibration (ACCU-CHEK GUIDE CONTROL) LIQD See admin instructions. 09/03/20   [provider]  Blood Glucose Monitoring Suppl (ACCU-CHEK GUIDE) w/Device KIT See admin instructions.    [provider]  Calcium  Carbonate (CALCIUM  600 PO) Take 600 mg by mouth daily.    [provider]  cholecalciferol  (VITAMIN D3) 25 MCG (1000 UNIT) tablet Take 1,000 Units by mouth daily.    [provider]  hydrocortisone  (ANUSOL -HC) 2.5 % rectal cream Place rectally 2 (two) times daily. Apply around anus for irritated & painful hemorrhoids 08/24/21   Sheldon Standing, MD  metFORMIN  (GLUCOPHAGE -XR) 500 MG 24 hr tablet Take 1,000 mg by mouth daily with breakfast. 04/12/19   [provider]  neomycin-bacitracin -polymyxin (NEOSPORIN) ointment Apply 1 application. topically as needed for wound care.    [provider]  ondansetron  (ZOFRAN ) 4 MG  tablet Take 1 tablet (4 mg total) by mouth every 6 (six) hours. 12/04/21   Curatolo, Adam, DO  ondansetron  (ZOFRAN -ODT) 8 MG disintegrating tablet Take 1 tablet (8 mg total) by mouth every 8 (eight) hours as needed for nausea or vomiting. 02/03/23   Raford Lenis, MD  OVER THE COUNTER MEDICATION Take 2 capsules by mouth daily. Imnuneti otc supplement    [provider]  Vibegron  75 MG TABS Take 75 mg by mouth daily.    [provider]     Family History  Problem Relation Age of Onset    Hypertension Mother    Heart attack Mother     Social History   Socioeconomic History   Marital status: Married    Spouse name: Not on file   Number of children: Not on file   Years of education: Not on file   Highest education level: Not on file  Occupational History   Not on file  Tobacco Use   Smoking status: Never   Smokeless tobacco: Never  Vaping Use   Vaping status: Never Used  Substance and Sexual Activity   Alcohol use: No   Drug use: No   Sexual activity: Not on file  Other Topics Concern   Not on file  Social History Narrative   Not on file   Social Drivers of Health   Tobacco Use: Low Risk (02/08/2024)   Patient History    Smoking Tobacco Use: Never    Smokeless Tobacco Use: Never    Passive Exposure: Not on file  Financial Resource Strain: Not on file  Food Insecurity: Not on file  Transportation Needs: Not on file  Physical Activity: Not on file  Stress: Not on file  Social Connections: Unknown (09/05/2021)   Received from [facility]   Social Network    Social Network: Not on file  Depression (PHQ2-9): Not on file  Alcohol Screen: Not on file  Housing: Not on file  Utilities: Not on file  Health Literacy: Not on file    ECOG Status: {CHL ONC ECOG ED:8845999799}  Review of Systems: A 12 point ROS discussed and pertinent positives are indicated in the HPI above.  All other systems are negative.  Review of Systems  Vital Signs: There were no vitals taken for this visit.  Physical Exam  Mallampati Score:     Imaging:     MR Abd W WO, 01/28/24 IMPRESSION:  1. 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 Recommend follow up pre and post contrast MRI/MRCP in 1 year.  2. 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 Suggest Urology consult.  3. 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  No results found.  Labs:  CBC: No results for input(s): WBC, HGB, HCT, PLT in the last 8760 hours.  COAGS: No results for input(s): INR, APTT in the last 8760 hours.  BMP: No results for input(s): NA, K, CL, CO2, GLUCOSE, BUN, CALCIUM , CREATININE, GFRNONAA, GFRAA in the last 8760 hours.  Invalid input(s): CMP  LIVER FUNCTION TESTS: No results for input(s): BILITOT, AST, ALT, ALKPHOS, PROT, ALBUMIN in the last 8760 hours.  TUMOR MARKERS: No results for input(s): AFPTM, CEA, CA199, CHROMGRNA in the last 8760 hours.  Assessment and Plan:  ***  Thank  you for this interesting consult.  I greatly enjoyed meeting Makynzi Eastland and look forward to participating in their care.  A copy of this report was sent to the requesting provider on this date.  Electronically Signed:  Thom Hall, MD [facility]   Pager. [phone] Clinic. [phone]  I spent a total of {New INPT:304952001} {New Out-Pt:304952002}  {Established Out-Pt:304952003} in face to face in clinical consultation, greater than 50% of which was counseling/coordinating care for ***  "

## 2024-05-12 ENCOUNTER — Ambulatory Visit
Admission: RE | Admit: 2024-05-12 | Discharge: 2024-05-12 | Disposition: A | Source: Ambulatory Visit | Attending: Urology

## 2024-05-12 DIAGNOSIS — N281 Cyst of kidney, acquired: Secondary | ICD-10-CM

## 2024-05-12 HISTORY — PX: IR RADIOLOGIST EVAL & MGMT: IMG5224

## 2024-05-12 NOTE — Consult Note (Signed)
 "      Chief Complaint: Patient was seen in consultation today for a suspicious right renal cystic lesion  at the request of Christina [name]  Referring Physician(s): Christina [name]  History of Present Illness: Christina [name] is a 74 y.o. female with past medical history significant for diabetes, and chronic kidney disease.  Patient has had serial imaging to follow-up pancreatic cystic lesions.  MRI from 01/28/2024 demonstrated a complex cystic lesion with nodular thick walls measuring up to 2.2 cm previously measured 1.7 cm.  Urology referred the patient to Interventional Radiology for management and possible ablation.  Patient appears to be asymptomatic from her right renal cystic lesion.  She has no significant abdominal or flank pain.  No hematuria.  No significant change in weight or appetite.  She is very anxious about the renal lesion.  Past Medical History:  Diagnosis Date   Arthritis    OA RIGHT KNEE AND PAIN   Cancer (HCC) 05/04/2009   HX OF COLON CANCER; S/P SURGERY AND DID NOT HAVE TO HAVE CHEMO OR RADIATION   Diabetes mellitus without complication (HCC)    Hypertension     Past Surgical History:  Procedure Laterality Date   BREAST SURGERY     RIGHT AND LEFT BREAST TUMORS REMOVED - BENIGN age 50 and 4   C-SECTIONS X 2  1981   1991   COLON SURGERY  2015   CYST EXCISION Left 2020   hand and ganglian cyst and 2021 Lt arm   ECTOPIC PREGNANCY SURGERY  1986   FATTY TUMOR REMOVED FROM RIGHT HEEL     age 31   LAPAROSCOPY N/A 08/19/2021   Procedure: LAPAROSCOPIC LYSIS OF ADHESIONS AND REPAIR OF ENTEROTOMY;  Surgeon: Lyndel Deward PARAS, MD;  Location: [facility];  Service: General;  Laterality: N/A;   TONSILLECTOMY     AT AGE OF 12   TOTAL KNEE ARTHROPLASTY Right 08/02/2013   Procedure: RIGHT TOTAL KNEE ARTHROPLASTY;  Surgeon: Tanda DELENA Heading, MD;  Location: [facility];  Service: Orthopedics;  Laterality: Right;   TOTAL KNEE ARTHROPLASTY Left 03/31/2021    Procedure: TOTAL KNEE ARTHROPLASTY;  Surgeon: Melodi Lerner, MD;  Location: [facility];  Service: Orthopedics;  Laterality: Left;   UMBILICAL HERNIA REPAIR N/A 08/19/2021   Procedure: PRIMARY REPAIR OF PERIUMBILICAL INCISIONAL HERNIA;  Surgeon: Lyndel Deward PARAS, MD;  Location: [facility];  Service: General;  Laterality: N/A;    Allergies: Penicillins, Rosuvastatin, Sulfa antibiotics, Cholestyramine, Gemfibrozil, and Statins  Medications: Prior to Admission medications  Medication Sig Start Date End Date Taking? Authorizing Provider  ACCU-CHEK AVIVA PLUS test strip  01/18/19   [provider]  Accu-Chek Softclix Lancets lancets  01/17/19   [provider]  acetaminophen  (TYLENOL ) 500 MG tablet Take 1,000 mg by mouth every 6 (six) hours as needed for mild pain.    [provider]  Alcohol Swabs  (B-D SINGLE USE SWABS  REGULAR) PADS USE TO TEST BLOOD SUGAR ONCE A DAY    [provider]  amLODipine -benazepril  (LOTREL) 10-40 MG capsule Take 1 capsule by mouth daily. 12/26/18   [provider]  APPLE CIDER VINEGAR PO Take 3 capsules by mouth daily. Goli Gummy    [provider]  Blood Glucose Calibration (ACCU-CHEK GUIDE CONTROL) LIQD See admin instructions. 09/03/20   [provider]  Blood Glucose Monitoring Suppl (ACCU-CHEK GUIDE) w/Device KIT See admin instructions.    [provider]  Calcium  Carbonate (CALCIUM  600 PO) Take 600 mg by mouth daily.  [provider]  cholecalciferol  (VITAMIN D3) 25 MCG (1000 UNIT) tablet Take 1,000 Units by mouth daily.    [provider]  hydrocortisone  (ANUSOL -HC) 2.5 % rectal cream Place rectally 2 (two) times daily. Apply around anus for irritated & painful hemorrhoids 08/24/21   Sheldon Standing, MD  metFORMIN  (GLUCOPHAGE -XR) 500 MG 24 hr tablet Take 1,000 mg by mouth daily with breakfast. 04/12/19   [provider]  neomycin-bacitracin -polymyxin (NEOSPORIN) ointment Apply 1  application. topically as needed for wound care.    [provider]  ondansetron  (ZOFRAN ) 4 MG tablet Take 1 tablet (4 mg total) by mouth every 6 (six) hours. 12/04/21   Curatolo, Nyron Mozer, DO  ondansetron  (ZOFRAN -ODT) 8 MG disintegrating tablet Take 1 tablet (8 mg total) by mouth every 8 (eight) hours as needed for nausea or vomiting. 02/03/23   Raford Lenis, MD  OVER THE COUNTER MEDICATION Take 2 capsules by mouth daily. Imnuneti otc supplement    [provider]  Vibegron  75 MG TABS Take 75 mg by mouth daily.    [provider]     Family History  Problem Relation Age of Onset   Hypertension Mother    Heart attack Mother     Social History   Socioeconomic History   Marital status: Married    Spouse name: Not on file   Number of children: Not on file   Years of education: Not on file   Highest education level: Not on file  Occupational History   Not on file  Tobacco Use   Smoking status: Never   Smokeless tobacco: Never  Vaping Use   Vaping status: Never Used  Substance and Sexual Activity   Alcohol use: No   Drug use: No   Sexual activity: Not on file  Other Topics Concern   Not on file  Social History Narrative   Not on file   Social Drivers of Health   Tobacco Use: Low Risk (02/08/2024)   Patient History    Smoking Tobacco Use: Never    Smokeless Tobacco Use: Never    Passive Exposure: Not on file  Financial Resource Strain: Not on file  Food Insecurity: Not on file  Transportation Needs: Not on file  Physical Activity: Not on file  Stress: Not on file  Social Connections: Unknown (09/05/2021)   Received from [facility]   Social Network    Social Network: Not on file  Depression (PHQ2-9): Not on file  Alcohol Screen: Not on file  Housing: Not on file  Utilities: Not on file  Health Literacy: Not on file    ECOG Status: 1 - Symptomatic but completely ambulatory   Review of Systems  Vital Signs: BP (!) 146/80 (BP Location: Left  Arm, Patient Position: Sitting, Cuff Size: Normal)   Pulse 72   Temp 97.6 F (36.4 C) (Oral)   Resp 20   SpO2 97%     Physical Exam Constitutional:      Appearance: She is not ill-appearing.  Cardiovascular:     Rate and Rhythm: Normal rate.  Pulmonary:     Effort: Pulmonary effort is normal.  Neurological:     Mental Status: She is alert.      Imaging: CLINICAL DATA:  Follow-up pancreatic lesions.   EXAM: MRI ABDOMEN WITHOUT AND WITH CONTRAST   TECHNIQUE: Multiplanar multisequence MR imaging of the abdomen was performed both before and after the administration of intravenous contrast.   CONTRAST:  9 cc of Vueway    COMPARISON:  MRI October 15, 2022   FINDINGS: Lower chest: No acute abnormality.   Hepatobiliary: No significant hepatic steatosis or iron deposition. No suspicious hepatic lesion. Gallbladder is unremarkable. No biliary ductal dilation.   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 None of which demonstrate suspicious MRI features. For reference:   -Cystic lesion in the pancreatic head measures 13 mm on image 19/5, unchanged when remeasured for consistency.   -cystic lesion in the pancreatic tail/body junction measures 10 mm, unchanged.   -cystic lesion along the superior margin of the pancreas at the celiac axis measures 14 mm on image 15/5 previously 17 mm   No pancreatic ductal dilation or evidence of acute inflammation.   Spleen:  No splenomegaly.   Adrenals/Urinary Tract: Similar bilateral adrenal thickening favor hyperplasia.   No hydronephrosis.   Complex cystic right upper pole renal lesion with nodular thickened walls measures 2.2 by 1.8 cm on image 14/5 previously 1.7 x 1.5 cm. Interval rupture with partial involution of a cystic component along the posterior margin now measuring 5 mm on image 12/5.   6 mm T1 hyperintense focus in the left kidney on image 45/16 technically too small to accurately  characterize but favored to reflect a benign hemorrhagic/proteinaceous renal cyst.   7 mm left lower pole renal lesion on image 60/16 is isointense on T1 without evidence of definitive postcontrast enhancement.   Stomach/Bowel: No evidence of bowel obstruction. Colonic diverticulosis.   Vascular/Lymphatic: Normal caliber abdominal aorta. Aortic atherosclerosis. Smooth IVC contours. 8 mm right splenic artery pseudoaneurysm, unchanged from prior. No pathologically enlarged abdominal lymph nodes.   Other: Nonenhancing right retrocrural cystic signal intensity on image 14/5 is favored a benign etiology such as cisterna chyli is stable from prior.   Musculoskeletal: No aggressive osseous lesion.   IMPRESSION: 1. 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 Recommend follow up pre and post contrast MRI/MRCP in 1 year. 2. 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 Suggest Urology consult. 3. 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     Electronically Signed   By: Reyes Holder M.D.   On: 01/28/2024 10:58  Labs:  CMP     Component Value Date/Time   NA 141 02/03/2023 0007   K 4.3 02/03/2023 0007   CL 102 02/03/2023 0007   CO2 29 02/03/2023 0007   GLUCOSE 190 (H) 02/03/2023 0007   BUN 18 02/03/2023 0007   CREATININE 1.42 (H) 02/03/2023 0007   CALCIUM  10.0 02/03/2023 0007   PROT 7.5 02/03/2023 0007   ALBUMIN 4.0 02/03/2023 0007   AST 16 02/03/2023 0007   ALT 15 02/03/2023 0007   ALKPHOS 55 02/03/2023 0007   BILITOT 0.6 02/03/2023 0007   GFRNONAA 39 (L) 02/03/2023 0007       Assessment and Plan:  74 year old with a complex right renal cyst.  CT from 01/12/2019 demonstrated a low-density exophytic cyst in the right  kidney upper pole measuring up to 3.9 cm.  Follow-up CT on 05/28/2021 demonstrated that the right renal cyst was more complex but only measured 3.1 x 1.9 cm.  MRI from 01/28/2024 suggests that the complex cyst has nodular thickened walls and measures up to 2.2 cm and enlarged since 2024.  Findings are suggestive for an involuted exophytic  cyst but the wall enhancement is concerning for neoplasia.  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  This aneurysm measured up to 2.0 cm in 2000 and has not significantly changed in size.  In addition, the patient has multiple pancreatic cysts that are being followed and are suggestive for sidebranch IPMNs.  We reviewed the patient's imaging and explained that this right renal complex cystic structure is indeterminate.  It is somewhat reassuring that it was present in 2020 and has decreased in size.  Not sure what to make of the wall thickening and enhancement.  Wonder if this finding is associated with previous inflammation or infection.  We discussed management options including surveillance, biopsy and ablation.  Patient is anxious about the possibility of cancer but also very anxious about having any procedure done.  I would like to get follow-up imaging prior to any procedure since the last study was in September 2025.  After a long discussion about surveillance versus cryoablation, the patient decided that she would like to tentatively schedule a right renal cryoablation in February 2026 if the follow up imaging remains suspicious.  We will get an MRI in early February 2026 to reevaluate the lesion.  If the lesion remains suspicious, we will plan for image guided cryoablation.  We discussed the cryoablation procedure in depth including the use of general anesthesia and the risks of bleeding, infection, worsening renal function and incomplete treatment.  We will consider doing a biopsy at the same time  although I am concerned a biopsy may be indeterminate based on the morphologic characteristics of the cystic lesion.  Plan for follow-up MRI of the abdomen with and without contrast in early February 2026 and tentatively plan for cryoablation later in the month.  Thank you for this interesting consult.  I greatly enjoyed meeting Leverne Tessler and look forward to participating in their care.  A copy of this report was sent to the requesting provider on this date.  Electronically Signed: Juliene JONELLE Balder 05/12/2024, 2:24 PM   I spent a total of  40 Minutes   in face to face in clinical consultation, greater than 50% of which was counseling/coordinating care for a right renal lesion.  "

## 2024-05-24 ENCOUNTER — Ambulatory Visit: Admitting: Podiatry

## 2024-05-24 ENCOUNTER — Encounter: Payer: Self-pay | Admitting: Podiatry

## 2024-05-24 DIAGNOSIS — N1831 Chronic kidney disease, stage 3a: Secondary | ICD-10-CM

## 2024-05-24 DIAGNOSIS — E1122 Type 2 diabetes mellitus with diabetic chronic kidney disease: Secondary | ICD-10-CM | POA: Diagnosis not present

## 2024-05-24 DIAGNOSIS — B351 Tinea unguium: Secondary | ICD-10-CM

## 2024-05-24 DIAGNOSIS — M79675 Pain in left toe(s): Secondary | ICD-10-CM

## 2024-05-24 DIAGNOSIS — L84 Corns and callosities: Secondary | ICD-10-CM | POA: Diagnosis not present

## 2024-05-24 DIAGNOSIS — M79674 Pain in right toe(s): Secondary | ICD-10-CM

## 2024-05-29 ENCOUNTER — Encounter: Payer: Self-pay | Admitting: Podiatry

## 2024-05-29 NOTE — Progress Notes (Signed)
 "  Subjective:  Patient ID: Christina [name], female    DOB: [birth year],  MRN: [id number]  Christina [name] presents to clinic today for at risk foot care. Pt has h/o NIDDM with chronic kidney disease and callus(es) right foot and painful mycotic toenails that are difficult to trim. Painful toenails interfere with ambulation. Aggravating factors include wearing enclosed shoe gear. Pain is relieved with periodic professional debridement. Painful calluses are aggravated when weightbearing with and without shoegear. Pain is relieved with periodic professional debridement.  Chief Complaint  Patient presents with   [facility]    Patient with an A1c of 6.0 presents today for [facility] and nail trim. PCP : CHARLES ROSS  LVM:03/2024   New problem(s): None.   PCP is [name], MD.  Allergies[1]  Review of Systems: Negative except as noted in the HPI.  Objective:  There were no vitals filed for this visit. Christina [name] is a pleasant 74 y.o. female in NAD. AAO x 3.  Vascular Examination: Capillary refill time immediate b/l. Vascular status intact b/l with palpable pedal pulses. Pedal hair present b/l. No pain with calf compression b/l. Skin temperature gradient WNL b/l. No cyanosis or clubbing b/l. No ischemia or gangrene noted b/l. Trace edema noted BLE.  Neurological Examination: Sensation grossly intact b/l with 10 gram monofilament. Vibratory sensation intact b/l.   Dermatological Examination: Pedal skin with normal turgor, texture and tone b/l.  No open wounds. No interdigital macerations.   Toenails 1-5 b/l thick, discolored, elongated with subungual debris and pain on dorsal palpation.   Hyperkeratotic lesion(s) submet head 5 right foot.  No erythema, no edema, no drainage, no fluctuance.  Musculoskeletal Examination: Muscle strength 5/5 to all lower extremity muscle groups bilaterally. HAV with bunion deformity noted b/l LE. Hammertoe(s) b/l 5th toes.  Radiographs:  None  Assessment/Plan: 1. Pain due to onychomycosis of toenails of both feet   2. Callus   3. Type 2 diabetes mellitus with stage 3a chronic kidney disease, without long-term current use of insulin  [facility])   Consent given for treatment. Patient examined.All patient's and/or POA's questions/concerns addressed on today's visit. Toenails 1-5 b/l debrided in length and girth without incident. Callus(es) submet head 5 right foot pared with sharp debridement without incident. Continue foot and shoe inspections daily. Monitor blood glucose per PCP/Endocrinologist's recommendations.Continue soft, supportive shoe gear daily. Report any pedal injuries to medical professional. Call office if there are any questions/concerns. Return in about 3 months (around 08/22/2024).  Delon LITTIE Merlin, DPM      [place] LOCATION: [address] [place] [address]                   Office [phone]   [facility] LOCATION: [address] [place], [place] [address] Office [phone]     [1]  Allergies Allergen Reactions   Penicillins Dermatitis and Rash    Tolerated Cephalosporin Date: 04/01/21. rash/ skin peel/ and nails come off Other reaction(s): rash/ skin peel/ and nails come off   Rosuvastatin     Cramps (ALLERGY/intolerance), muscle pain Other reaction(s): muscle pain   Sulfa Antibiotics Nausea And Vomiting    Muscle Cramps   Cholestyramine Diarrhea   Gemfibrozil Diarrhea and Nausea  And Vomiting   Statins     Cramps, muscle pain   "

## 2024-05-30 ENCOUNTER — Other Ambulatory Visit: Payer: Self-pay | Admitting: Diagnostic Radiology

## 2024-05-30 DIAGNOSIS — N2889 Other specified disorders of kidney and ureter: Secondary | ICD-10-CM

## 2024-06-09 ENCOUNTER — Other Ambulatory Visit

## 2024-06-23 ENCOUNTER — Other Ambulatory Visit

## 2024-08-08 ENCOUNTER — Ambulatory Visit: Admitting: Dermatology

## 2024-08-29 ENCOUNTER — Ambulatory Visit: Admitting: Podiatry
